# Patient Record
Sex: Male | Born: 1950 | Race: White | Hispanic: No | Marital: Married | Smoking: Current every day smoker
Health system: Southern US, Community
[De-identification: ages and names within clinical notes are randomized; demographics above are authoritative.]

## PROBLEM LIST (undated history)

## (undated) DIAGNOSIS — H9190 Unspecified hearing loss, unspecified ear: Secondary | ICD-10-CM

## (undated) DIAGNOSIS — T7840XA Allergy, unspecified, initial encounter: Secondary | ICD-10-CM

## (undated) DIAGNOSIS — I77811 Abdominal aortic ectasia: Secondary | ICD-10-CM

## (undated) DIAGNOSIS — I739 Peripheral vascular disease, unspecified: Secondary | ICD-10-CM

## (undated) DIAGNOSIS — IMO0001 Reserved for inherently not codable concepts without codable children: Secondary | ICD-10-CM

## (undated) DIAGNOSIS — E785 Hyperlipidemia, unspecified: Secondary | ICD-10-CM

## (undated) DIAGNOSIS — M25561 Pain in right knee: Secondary | ICD-10-CM

## (undated) DIAGNOSIS — M1990 Unspecified osteoarthritis, unspecified site: Secondary | ICD-10-CM

## (undated) DIAGNOSIS — I2510 Atherosclerotic heart disease of native coronary artery without angina pectoris: Secondary | ICD-10-CM

## (undated) DIAGNOSIS — F17200 Nicotine dependence, unspecified, uncomplicated: Secondary | ICD-10-CM

## (undated) DIAGNOSIS — R5383 Other fatigue: Secondary | ICD-10-CM

## (undated) DIAGNOSIS — E039 Hypothyroidism, unspecified: Secondary | ICD-10-CM

## (undated) DIAGNOSIS — N419 Inflammatory disease of prostate, unspecified: Secondary | ICD-10-CM

## (undated) DIAGNOSIS — I7789 Other specified disorders of arteries and arterioles: Secondary | ICD-10-CM

## (undated) DIAGNOSIS — R7303 Prediabetes: Secondary | ICD-10-CM

## (undated) DIAGNOSIS — Z86010 Personal history of colonic polyps: Secondary | ICD-10-CM

## (undated) DIAGNOSIS — N200 Calculus of kidney: Secondary | ICD-10-CM

## (undated) DIAGNOSIS — Z955 Presence of coronary angioplasty implant and graft: Secondary | ICD-10-CM

## (undated) DIAGNOSIS — M25562 Pain in left knee: Secondary | ICD-10-CM

## (undated) DIAGNOSIS — J329 Chronic sinusitis, unspecified: Secondary | ICD-10-CM

## (undated) HISTORY — DX: Allergy, unspecified, initial encounter: T78.40XA

## (undated) HISTORY — DX: Presence of coronary angioplasty implant and graft: Z95.5

## (undated) HISTORY — DX: Unspecified osteoarthritis, unspecified site: M19.90

## (undated) HISTORY — PX: APPENDECTOMY: SHX54

## (undated) HISTORY — DX: Reserved for inherently not codable concepts without codable children: IMO0001

## (undated) HISTORY — DX: Atherosclerotic heart disease of native coronary artery without angina pectoris: I25.10

## (undated) HISTORY — DX: Unspecified hearing loss, unspecified ear: H91.90

## (undated) HISTORY — PX: CARDIOVASCULAR STRESS TEST: SHX262

## (undated) HISTORY — DX: Peripheral vascular disease, unspecified: I73.9

## (undated) HISTORY — DX: Personal history of colonic polyps: Z86.010

## (undated) HISTORY — DX: Other fatigue: R53.83

## (undated) HISTORY — DX: Chronic sinusitis, unspecified: J32.9

## (undated) HISTORY — DX: Prediabetes: R73.03

## (undated) HISTORY — DX: Inflammatory disease of prostate, unspecified: N41.9

## (undated) HISTORY — DX: Calculus of kidney: N20.0

## (undated) HISTORY — DX: Pain in left knee: M25.562

## (undated) HISTORY — DX: Pain in right knee: M25.561

## (undated) HISTORY — DX: Hyperlipidemia, unspecified: E78.5

## (undated) HISTORY — DX: Hypothyroidism, unspecified: E03.9

## (undated) HISTORY — DX: Nicotine dependence, unspecified, uncomplicated: F17.200

## (undated) HISTORY — DX: Other specified disorders of arteries and arterioles: I77.89

---
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
## 2003-03-23 DIAGNOSIS — Z955 Presence of coronary angioplasty implant and graft: Secondary | ICD-10-CM

## 2003-03-23 HISTORY — DX: Presence of coronary angioplasty implant and graft: Z95.5

## 2003-04-16 ENCOUNTER — Ambulatory Visit (HOSPITAL_COMMUNITY): Admission: RE | Admit: 2003-04-16 | Discharge: 2003-04-16 | Payer: Self-pay | Admitting: Cardiology

## 2003-04-16 HISTORY — PX: CARDIAC CATHETERIZATION: SHX172

## 2003-04-19 ENCOUNTER — Ambulatory Visit (HOSPITAL_COMMUNITY): Admission: RE | Admit: 2003-04-19 | Discharge: 2003-04-20 | Payer: Self-pay | Admitting: Cardiology

## 2003-04-19 HISTORY — PX: CORONARY STENT PLACEMENT: SHX1402

## 2003-06-11 ENCOUNTER — Ambulatory Visit (HOSPITAL_COMMUNITY): Admission: RE | Admit: 2003-06-11 | Discharge: 2003-06-11 | Payer: Self-pay | Admitting: Cardiology

## 2003-08-30 ENCOUNTER — Ambulatory Visit (HOSPITAL_COMMUNITY): Admission: RE | Admit: 2003-08-30 | Discharge: 2003-08-30 | Payer: Self-pay | Admitting: Cardiology

## 2005-01-20 ENCOUNTER — Ambulatory Visit (HOSPITAL_COMMUNITY): Admission: RE | Admit: 2005-01-20 | Discharge: 2005-01-20 | Payer: Self-pay | Admitting: Cardiology

## 2009-10-29 ENCOUNTER — Ambulatory Visit: Payer: Self-pay | Admitting: Cardiology

## 2009-11-09 ENCOUNTER — Emergency Department (HOSPITAL_BASED_OUTPATIENT_CLINIC_OR_DEPARTMENT_OTHER): Admission: EM | Admit: 2009-11-09 | Discharge: 2009-11-10 | Payer: Self-pay | Admitting: Emergency Medicine

## 2009-11-09 ENCOUNTER — Ambulatory Visit: Payer: Self-pay | Admitting: Diagnostic Radiology

## 2010-01-02 ENCOUNTER — Ambulatory Visit: Payer: Self-pay | Admitting: Cardiology

## 2010-03-22 DIAGNOSIS — IMO0001 Reserved for inherently not codable concepts without codable children: Secondary | ICD-10-CM

## 2010-03-22 HISTORY — DX: Reserved for inherently not codable concepts without codable children: IMO0001

## 2010-06-05 LAB — BASIC METABOLIC PANEL
BUN: 18 mg/dL (ref 6–23)
Calcium: 9.6 mg/dL (ref 8.4–10.5)
Chloride: 108 mEq/L (ref 96–112)
GFR calc Af Amer: >60 mL/min (ref >60)
GFR calc non Af Amer: >60 mL/min (ref >60)

## 2010-06-05 LAB — URINE MICROSCOPIC-ADD ON

## 2010-06-05 LAB — URINALYSIS, ROUTINE W REFLEX MICROSCOPIC
Leukocytes, UA: NEGATIVE
Nitrite: NEGATIVE
Specific Gravity, Urine: 1.027 (ref 1.005–1.030)

## 2010-08-07 NOTE — Cardiovascular Report (Signed)
NAME:  Lawrence Hunter, Lawrence Hunter                       ACCOUNT NO.:  [id number]   MEDICAL RECORD NO.:  [id number]                   PATIENT TYPE:  OIB   LOCATION:  2866                                 FACILITY:  [facility]   PHYSICIAN:  Colleen Can. Deborah Chalk, M.D.            DATE OF BIRTH:  1950/05/31   DATE OF PROCEDURE:  04/16/2003  DATE OF DISCHARGE:  04/16/2003                              CARDIAC CATHETERIZATION   PROCEDURE:  Left heart catheterization with selective coronary angiography,  left ventricular angiography and AngioSeal closure.   CARDIOLOGIST:  Colleen Can. Deborah Chalk, M.D.   TYPE AND SITE OF ENTRY:  Percutaneous right femoral artery, percutaneous  right femoral vein.   CATHETERS:  A 6 French 4 curved Judkins right and left coronary catheters, 6  French pigtail ventriculographic catheter.   COMMENT:  The patient tolerated the procedure well.   CONTRAST MATERIAL:  Omnipaque   HEMODYNAMIC DATA:  1. The aortic pressure was 108/66.  2. Left ventricular pressure was 108/5-7.  3. There was no aortic valve gradient noted on pullback.   ANGIOGRAPHIC DATA:  1. The left main coronary artery was normal.  2. The left anterior descending has a 50% narrowing after the first diagonal     vessel.  There are irregularities otherwise.  3. The left circumflex is totally occluded in a posterior lateral branch.     There was retrograde fill into this branch almost back to the area of     occlusion.  It would appear that this occlusion could be suitable for     intervention.  4. The right coronary artery has an ulcerated plaque in the proximal segment     approximately 3 cm from the origin.  This is a 2.75 mm vessel at this     juncture.  There appears to be an ulceration at this area.  There are     irregularities and diffuse atherosclerosis in the remainder of the right     coronary artery, but then there are excellent collaterals to the distal     left circumflex by way of the continuation  branch of the right coronary     artery as well as the right atrial branch.   LEFT VENTRICULOGRAPHY:  Left ventricular angiogram was performed in the RAO  position.  The overall cardiac size and silhouette are normal.  The global  ejection fraction is 60%.  Regional wall motion appears to be normal.   OVERALL IMPRESSION:  1. Normal left ventricular function.  2. 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   PLAN:  Mr. Mantione will be brought back to the catheterization lab in three  days while on Plavix.  We will attempt to open the left circumflex.  If that  is successful, we will move on to angioplasty  and stent placement of the  proximal right coronary artery.                                               Colleen Can. Deborah Chalk, M.D.    SNT/MEDQ  D:  04/16/2003  T:  04/17/2003  Job:  [id number]

## 2010-08-07 NOTE — H&P (Signed)
NAME:  Lawrence Hunter, FANT NO.:  [id number]   MEDICAL RECORD NO.:  [id number]                   PATIENT TYPE:  OIB   LOCATION:                                       FACILITY:  [facility]   PHYSICIAN:  Colleen Can. Deborah Chalk, M.D.            DATE OF BIRTH:  1951-01-18   DATE OF ADMISSION:  04/16/2003  DATE OF DISCHARGE:                                HISTORY & PHYSICAL   CHIEF COMPLAINT:  Shortness of breath, previous episode of chest pain with  an abnormal stress Cardiolite study.   HISTORY OF PRESENT ILLNESS:  The patient is a 60 year old white male who has  multiple cardiovascular risk factors. He presented for a routine stress  Cardiolite study last week. Approximately 7-10 days ago he had an episode of  an awareness of a discomfort under the left breast. It was basically off and  on throughout the course of the day. It was not worse with exertion. It  subsequently subsided without intervention. He has had some ongoing  shortness of breath which has not been unusual. He was referred for a stress  Cardiolite study in which he demonstrated fair exercise tolerance. He had no  chest pain, but did have shortness of breath. His EKG showed poor R-wave  progression anteriorly. There were no ST changes to suggest ischemia. His  blood pressure response was adequate. There was a reversible anterolateral  defect most consistent with ischemia noted on the scintigraphic images. He  is now referred for elective cardiac catheterization. He has had no further  bouts of chest pain.   PAST MEDICAL HISTORY:  1. Status post appendectomy.  2. Hyperlipidemia. Recently initiated on statin therapy.  3. Questionable hypothyroidism.  4. Ongoing tobacco abuse.  5. Shortness of breath.   ALLERGIES:  No known drug allergies.   CURRENT MEDICATIONS:  1. Lipitor 20 mg.  2. Aspirin daily, just started last week.   FAMILY HISTORY:  Father died of a stroke at 1. Mother died at 87 with  a  stroke as well. His brother has had two heart attacks and is 62 years old.  He has no sisters.   SOCIAL HISTORY:  He is employed at __________ [employer]. He smokes one  and one-half pack of cigarettes a day and has no alcohol use. He lives at  home with his wife. He has two sons.   REVIEW OF SYSTEMS:  Basically as noted above. He has had no recent fever or  flu. He notes no changes in his voice. He does have some problems with  constipation and uses laxatives on a p.r.n. basis. Otherwise, he has had no  abdominal complaints. He has had no peripheral edema. No recent bout of  chest pain. He has had no history of syncope. Otherwise review of systems is  as noted above and is otherwise unremarkable.   PHYSICAL EXAMINATION:  GENERAL:  He is a pleasant white male  in no acute  distress.  VITAL SIGNS:  Blood pressure 120/80 sitting, 110/80 standing, heart rate 96,  respirations 18. He is afebrile.  SKIN:  Warm and dry. Color is unremarkable.  HEENT:  Basically unremarkable.  LUNGS:  Somewhat course.  CARDIAC:  Regular rhythm.  ABDOMEN:  Soft. Positive bowel sounds. Nontender.  EXTREMITIES:  Without edema.  NEUROLOGIC:  Intact. There are no gross focal deficits.   LABORATORY DATA:  Pertinent labs are pending.   IMPRESSION:  1. Abnormal stress Cardiolite study.  2. Previous episode of atypical chest pain.  3. Shortness of breath.  4. Ongoing tobacco abuse.  5. Hyperlipidemia with recent initiation of statin therapy.  6. Questionable hypothyroidism. TSH was 12 when checked on April 08, 2003.  7. Positive family history for cardiovascular disease.   PLAN:  We will proceed on with elective cardiac catheterization. The  procedure has been reviewed in full detail and he is willing to proceed on  Tuesday, April 16, 2003.      Juanell Fairly C. Earl Gala, N.P.                 Colleen Can. Deborah Chalk, M.D.    LCO/MEDQ  D:  04/15/2003  T:  04/15/2003  Job:  [id number]   cc:   Brooke Bonito,  M.D.  [address]  [place]  [place] [address]  Fax: [phone]

## 2010-08-07 NOTE — Cardiovascular Report (Signed)
NAME:  Lawrence Hunter, Lawrence Hunter                       ACCOUNT NO.:  [id number]   MEDICAL RECORD NO.:  [id number]                   PATIENT TYPE:  OIB   LOCATION:  6524                                 FACILITY:  [facility]   PHYSICIAN:  Colleen Can. Deborah Chalk, M.D.            DATE OF BIRTH:  04/09/1950   DATE OF PROCEDURE:  04/19/2003  DATE OF DISCHARGE:  04/20/2003                              CARDIAC CATHETERIZATION   PROCEDURE:  Angioplasty and stent placement in the right coronary artery  with angioplasty and stent placement in the left circumflex coronary.   CARDIOLOGIST:  Colleen Can. Deborah Chalk, M.D.   DESCRIPTION OF PROCEDURE:  We initially turned out attention to the occluded  left circumflex coronary.  The patient received Integrilin and heparin.  We  used a Voda 3.5 7 [place] guide and a high torque floppy straight wire.  Subsequently, we moved on to a ________ medium and a _______.  We were  really unable to satisfactorily cross the stenosis with this.  We then  returned out attention to the right coronary artery and used the 7 [place] FR-  4 guide with side holes.  The high torque floppy guidewire was deployed  across the lesion, and we used a Cypher 3.0 x 18 mm Cortis J&J stent applied  at 15 atmospheres for 15 seconds proximally and then an reinflation of 14  atmospheres for 12 seconds. The final angiographic result in the right  coronary artery was felt to be excellent with no residual stenosis.   We then turned our attention back to the left coronary system.  We again  used the Voda guide.  We tried the Cross-It 100.  We then used the Cross-It  200 and were able to cross the totally occluded vessel.  We initially  dilated with a 1.5 x 20 mm Maverick balloon into the left circumflex  proximally.  We then inflated that progressively and were able to have  satisfactory antegrade flow in what was the previously occluded left  circumflex.  We then used a Maverick 2.5 x 20 mm balloon and inflated  that a  maximum of 11 atmospheres.  We then proceeded along with a Cypher 3.0 x 33  mm stent.  This was positioned in appropriate position and deployed at 14  atmospheres for 14 seconds.  The final angiographic result was felt to be  excellent with no residual stenosis.  Overall, the patient tolerated the  procedure well.   OVERALL IMPRESSION:  1. Successful angioplasty and stent placement in the left circumflex     coronary and right coronary artery.  2. Mild residual stenosis in the mid portion of the left anterior     descending.  Colleen Can. Deborah Chalk, M.D.    SNT/MEDQ  D:  05/01/2003  T:  05/02/2003  Job:  [id number]

## 2010-09-18 ENCOUNTER — Encounter: Payer: Self-pay | Admitting: Nurse Practitioner

## 2010-09-28 ENCOUNTER — Other Ambulatory Visit: Payer: Self-pay | Admitting: *Deleted

## 2010-09-28 ENCOUNTER — Ambulatory Visit: Payer: Self-pay | Admitting: Nurse Practitioner

## 2010-09-29 ENCOUNTER — Other Ambulatory Visit: Payer: Self-pay | Admitting: *Deleted

## 2010-09-29 DIAGNOSIS — E785 Hyperlipidemia, unspecified: Secondary | ICD-10-CM

## 2010-10-08 ENCOUNTER — Encounter: Payer: Self-pay | Admitting: Nurse Practitioner

## 2010-10-09 ENCOUNTER — Ambulatory Visit (INDEPENDENT_AMBULATORY_CARE_PROVIDER_SITE_OTHER): Payer: BC Managed Care – PPO | Admitting: Nurse Practitioner

## 2010-10-09 ENCOUNTER — Other Ambulatory Visit (INDEPENDENT_AMBULATORY_CARE_PROVIDER_SITE_OTHER): Payer: BC Managed Care – PPO | Admitting: *Deleted

## 2010-10-09 ENCOUNTER — Encounter: Payer: Self-pay | Admitting: Nurse Practitioner

## 2010-10-09 VITALS — BP 126/80 | HR 88 | Wt 204.0 lb

## 2010-10-09 DIAGNOSIS — E785 Hyperlipidemia, unspecified: Secondary | ICD-10-CM

## 2010-10-09 DIAGNOSIS — I2510 Atherosclerotic heart disease of native coronary artery without angina pectoris: Secondary | ICD-10-CM | POA: Insufficient documentation

## 2010-10-09 DIAGNOSIS — F17200 Nicotine dependence, unspecified, uncomplicated: Secondary | ICD-10-CM

## 2010-10-09 DIAGNOSIS — Z720 Tobacco use: Secondary | ICD-10-CM

## 2010-10-09 DIAGNOSIS — E7800 Pure hypercholesterolemia, unspecified: Secondary | ICD-10-CM | POA: Insufficient documentation

## 2010-10-09 LAB — HEPATIC FUNCTION PANEL
ALT: 24 U/L (ref 0–53)
AST: 17 U/L (ref 0–37)
Albumin: 5 g/dL (ref 3.5–5.2)
Alkaline Phosphatase: 95 U/L (ref 39–117)
Bilirubin, Direct: 0.1 mg/dL (ref 0.0–0.3)
Total Bilirubin: 1.1 mg/dL (ref 0.3–1.2)
Total Protein: 7.9 g/dL (ref 6.0–8.3)

## 2010-10-09 LAB — BASIC METABOLIC PANEL
BUN: 20 mg/dL (ref 6–23)
CO2: 27 mEq/L (ref 19–32)
Calcium: 9.2 mg/dL (ref 8.4–10.5)
Chloride: 106 mEq/L (ref 96–112)
Creatinine, Ser: 1 mg/dL (ref 0.4–1.5)
GFR: 83.46 mL/min (ref >60.00)
Glucose, Bld: 96 mg/dL (ref 70–99)
Potassium: 4.1 mEq/L (ref 3.5–5.1)
Sodium: 141 mEq/L (ref 135–145)

## 2010-10-09 LAB — LIPID PANEL
Cholesterol: 127 mg/dL (ref 0–200)
HDL: 43.8 mg/dL (ref >39.00)
LDL Cholesterol: 60 mg/dL (ref 0–99)
Total CHOL/HDL Ratio: 3
Triglycerides: 117 mg/dL (ref 0.0–149.0)
VLDL: 23.4 mg/dL (ref 0.0–40.0)

## 2010-10-09 NOTE — Assessment & Plan Note (Signed)
Smoking cessation is once again encouraged.  

## 2010-10-09 NOTE — Progress Notes (Signed)
    [name] Date of Birth: 05/29/1950   History of Present Illness: Rocky Link is seen today for his 9 month check. He is seen for Dr. Sanjuana Kava. He is a former patient of Dr. Ronnald Nian. He is doing well. No chest pain. He continues to smoke but continues to contemplate quitting. He is staying active. He is tolerating his medicines. Labs are checked today.   Current Outpatient Prescriptions on File Prior to Visit  Medication Sig Dispense Refill  . aspirin 81 MG tablet Take 81 mg by mouth daily.        Marland Kitchen ezetimibe (ZETIA) 10 MG tablet Take 10 mg by mouth daily.        Marland Kitchen levothyroxine (SYNTHROID, LEVOTHROID) 88 MCG tablet Take 88 mcg by mouth daily.        . simvastatin (ZOCOR) 40 MG tablet Take 40 mg by mouth at bedtime.          Allergies  Allergen Reactions  . Plavix (Clopidogrel Bisulfate) Rash    Past Medical History  Diagnosis Date  . Coronary artery disease   . Hyperlipidemia   . Hypothyroidism   . Tobacco abuse   . S/P coronary artery stent placement 2005    RCA and LCX  . Normal nuclear stress test 2009  . Claudication     Normal ABI's in 2006    Past Surgical History  Procedure Date  . Coronary stent placement 04/19/2003    STENT PLACEMENT IN THE LEFT CIRCUMFLEX CORONARY AND RIGHT CORONARY ARTERY. MILD RESIDUAL STENOSIS IN THE MID PORTION OF THE LEFT ANTERIOR DESCENDING  . Cardiac catheterization 04/16/2003    NORMAL. EF 60%. TOTALLY OCCLUDED LEFT CIRCUMFLEX WITH SEVERE DISEASE, AND ULCERATED PLAQUE IN THE PROXIMAL RIGHT CORONARY ARTERY AND MILD ATHERSCLEROSIS IN THE LEFT ANTERIOR DESCENDING  . Cardiovascular stress test 2009  . Appendectomy     History  Smoking status  . Current Everyday Smoker -- 1.0 packs/day  Smokeless tobacco  . Not on file    History  Alcohol Use No    Family History  Problem Relation Age of Onset  . Hypertension Mother   . Stroke Mother   . Hypertension Father   . Stroke Father   . Heart attack Brother     STENT     Review of Systems: The review of systems is positive for some recent sinus issues. No chest pain. He says he is not short of breath.  All other systems were reviewed and are negative.  Physical Exam: BP 126/80  Pulse 88  Wt 204 lb (92.534 kg) Patient is pleasant and in no acute distress. Skin is warm and dry. Color is normal.  HEENT is unremarkable. Normocephalic/atraumatic. PERRL. Sclera are nonicteric. Neck is supple. No masses. No JVD. Lungs are coarse. Cardiac exam shows a regular rate and rhythm. Abdomen is soft. Extremities are without edema. Gait and ROM are intact. No gross neurologic deficits noted.  LABORATORY DATA:   Assessment / Plan:

## 2010-10-09 NOTE — Patient Instructions (Signed)
Stay on your current medicines We need to update your stress test I will have you see Dr. Doylene Bode iin 6 months with fasting labs Call me for any problems.

## 2010-10-09 NOTE — Assessment & Plan Note (Addendum)
Labs are checked today. Coupon card for zetia given today.    Labs are reviewed and are satisfactory. Stay on same medicines. Recheck in 6 months.

## 2010-10-09 NOTE — Assessment & Plan Note (Signed)
He had remote stenting in 2005. He is allergic to Plavix and used Ticlid at that time. Currently doing well. Last stress test was in 2009. Will go ahead and update. We will see him back in 6 months. He will see Dr. Sanjuana Kava at that time. Patient is agreeable to this plan and will call if any problems develop in the interim.

## 2010-10-14 ENCOUNTER — Telehealth: Payer: Self-pay | Admitting: *Deleted

## 2010-10-14 NOTE — Telephone Encounter (Signed)
Notified of lab results. Will get fasting labs in Jan when sees Dr. Sanjuana Kava. Scheduled for nuclear study 9/5. Dr. Sanjuana Kava will call him results.

## 2010-11-06 ENCOUNTER — Encounter: Payer: Self-pay | Admitting: *Deleted

## 2010-11-25 ENCOUNTER — Ambulatory Visit (HOSPITAL_COMMUNITY): Payer: BC Managed Care – PPO | Attending: Cardiovascular Disease | Admitting: Radiology

## 2010-11-25 DIAGNOSIS — I2510 Atherosclerotic heart disease of native coronary artery without angina pectoris: Secondary | ICD-10-CM | POA: Insufficient documentation

## 2010-11-25 DIAGNOSIS — E785 Hyperlipidemia, unspecified: Secondary | ICD-10-CM

## 2010-11-25 MED ORDER — TECHNETIUM TC 99M TETROFOSMIN IV KIT
33.0000 | PACK | Freq: Once | INTRAVENOUS | Status: AC | PRN
  Administered 2010-11-25: 33 via INTRAVENOUS

## 2010-11-25 MED ORDER — TECHNETIUM TC 99M TETROFOSMIN IV KIT
11.0000 | PACK | Freq: Once | INTRAVENOUS | Status: AC | PRN
  Administered 2010-11-25: 11 via INTRAVENOUS

## 2010-11-25 NOTE — Progress Notes (Signed)
[facility] 3 NUCLEAR MED [address] [place] [address] [phone]  Cardiology Nuclear Med Study  Lawrence Hunter is a 60 y.o. male [id number] 07/14/1950   Nuclear Med Background Indication for Stress Test:  Evaluation for Ischemia and PTCA/Stent Patency  History:  '05 PTCA/Stent-RCA/LCX; '09 UVO:[id number], EF=69% Cardiac Risk Factors: Claudication, Family History - CAD, Lipids and Smoker  Symptoms:  No cardiac complaints.   Nuclear Pre-Procedure Caffeine/Decaff Intake:  None NPO After: 7:00pm   Lungs:  Clear. IV 0.9% NS with Angio Cath:  20g  IV Site: R Antecubital  IV Started by:  Lawrence Hunter, EMT-P  Chest Size (in):  42 Cup Size: n/a  Height: 6' (1.829 m)  Weight:  201 lb (91.173 kg)  BMI:  Body mass index is 27.26 kg/(m^2). Tech Comments:  NA    Nuclear Med Study 1 or 2 day study: 1 day  Stress Test Type:  Stress  Reading MD: Marca Ancona, MD  Order Authorizing Provider:  Verne Carrow, MD; Norma Fredrickson, NP  Resting Radionuclide: Technetium 89m Tetrofosmin  Resting Radionuclide Dose: 10.8 mCi   Stress Radionuclide:  Technetium 30m Tetrofosmin  Stress Radionuclide Dose: 33.0 mCi           Stress Protocol Rest HR: 82 Stress HR: 144  Rest BP: 107/76 Stress BP: 177/84  Exercise Time (min): 6:45 METS: 7.0   Predicted Max HR: 159 bpm % Max HR: 90.57 bpm Rate Pressure Product: 03474   Dose of Adenosine (mg):  n/a Dose of Lexiscan: n/a mg  Dose of Atropine (mg): n/a Dose of Dobutamine: n/a mcg/kg/min (at max HR)  Stress Test Technologist: Lawrence Hunter, CMA-N  Nuclear Technologist:  Lawrence Hunter, CNMT     Rest Procedure:  Myocardial perfusion imaging was performed at rest 45 minutes following the intravenous administration of Technetium 83m Tetrofosmin.  Rest ECG: No acute changes, poor R-wave progression.  Stress Procedure:  The patient exercised for 6:45 on the treadmill utilizing the Bruce protocol.  The patient  stopped due to fatigue and denied any chest pain.  There were no diagnostic ST-T wave changes.  Technetium 31m Tetrofosmin was injected at peak exercise and myocardial perfusion imaging was performed after a brief delay.  Stress ECG: No significant change from baseline ECG  QPS Raw Data Images:  Normal; no motion artifact; normal heart/lung ratio. Stress Images:  Normal homogeneous uptake in all areas of the myocardium. Rest Images:  Normal homogeneous uptake in all areas of the myocardium. Subtraction (SDS):  There is no evidence of scar or ischemia. Transient Ischemic Dilatation (Normal <1.22):  0.96 Lung/Heart Ratio (Normal <0.45):  0.33  Quantitative Gated Spect Images QGS EDV:  89 ml QGS ESV:  31 ml QGS cine images:  NL LV Function; NL Wall Motion QGS EF: 65%  Impression Exercise Capacity:  Fair exercise capacity. BP Response:  No chest pain, stopped due to fatigue.  Clinical Symptoms:  No chest pain. ECG Impression:  No significant ST segment change suggestive of ischemia. Comparison with Prior Nuclear Study: No significant change from previous study  Overall Impression:  Normal stress nuclear study.  Lawrence Hunter [employer]

## 2010-12-30 ENCOUNTER — Other Ambulatory Visit: Payer: Self-pay | Admitting: Cardiology

## 2011-01-08 ENCOUNTER — Other Ambulatory Visit: Payer: Self-pay | Admitting: *Deleted

## 2011-01-08 MED ORDER — EZETIMIBE 10 MG PO TABS: 10.0000 mg | ORAL_TABLET | Freq: Every day | ORAL | Status: DC

## 2011-01-20 ENCOUNTER — Other Ambulatory Visit: Payer: Self-pay | Admitting: Cardiovascular Disease

## 2011-01-21 ENCOUNTER — Telehealth: Payer: Self-pay | Admitting: *Deleted

## 2011-01-21 NOTE — Telephone Encounter (Signed)
Received refill request for levothyroxine. I called and spoke with wife and she states Dr. Deborah Chalk refilled this medication for pt when needed.  Pt no longer sees Dr. Juleen China and does not have primary care MD at this time.  I [facility] pt [facility]. Wife also given results of stress test.

## 2011-01-21 NOTE — Progress Notes (Signed)
Pt informed of results. Charlotte Crumb and I saw this study today. cdm

## 2011-02-05 NOTE — Telephone Encounter (Signed)
New problem:  Patient would like to medication zetia changed to something else.

## 2011-02-05 NOTE — Telephone Encounter (Signed)
Spoke with pt. He reports cost of Zetia has gone up recently and he is wondering if he could be switched to another medication. I told pt I would send to Dr. Clifton James for review and call him back with his recommendations.  Pt is due for lipid and liver profile in January.

## 2011-02-08 NOTE — Telephone Encounter (Signed)
We can stop his Zetia for now and continue Zocor. Based on repeat lipids/LFTS in January, we may have to increase dose of statin or change to a different statin. Can we let him know? Thanks, chris

## 2011-02-08 NOTE — Telephone Encounter (Signed)
FU Call: Pt returning call to [facility]. Please call back.

## 2011-02-08 NOTE — Telephone Encounter (Signed)
Spoke with pt and gave him recommendations from Dr. Clifton James. He will stop Zetia and continue zocor and is aware to come in for fasting labs on day of appt in January. He will call to schedule appt.

## 2011-02-08 NOTE — Telephone Encounter (Signed)
Left message to call back  

## 2011-03-17 ENCOUNTER — Other Ambulatory Visit: Payer: Self-pay | Admitting: Cardiovascular Disease

## 2011-05-24 ENCOUNTER — Other Ambulatory Visit: Payer: Self-pay | Admitting: Cardiovascular Disease

## 2011-05-25 ENCOUNTER — Telehealth: Payer: Self-pay | Admitting: *Deleted

## 2011-05-25 NOTE — Telephone Encounter (Signed)
Received refill request in office for levothyroxine.  Previous phone note indicates this was previously filled by Dr. Deborah Chalk. Pt is due for follow up with Dr. Clifton James.  Will refill levothyroxine for 2 months but pt will need to schedule appt with Dr. Clifton James for follow up.  I called to give pt this information and left message to call back.

## 2011-05-28 NOTE — Telephone Encounter (Addendum)
Patient returned call, I informed him that he needs to make f/u with [facility], will schedule next avail.    Patient scheduled for 06/30/11 @ 11AM w/[place]

## 2011-06-02 ENCOUNTER — Other Ambulatory Visit: Payer: Self-pay

## 2011-06-02 MED ORDER — SIMVASTATIN 40 MG PO TABS: 40.0000 mg | ORAL_TABLET | Freq: Every day | ORAL | Status: DC

## 2011-06-30 ENCOUNTER — Ambulatory Visit (INDEPENDENT_AMBULATORY_CARE_PROVIDER_SITE_OTHER): Payer: BC Managed Care – PPO | Admitting: Cardiovascular Disease

## 2011-06-30 ENCOUNTER — Encounter: Payer: Self-pay | Admitting: Cardiovascular Disease

## 2011-06-30 VITALS — BP 110/61 | HR 91 | Ht 72.0 in | Wt 203.0 lb

## 2011-06-30 DIAGNOSIS — I2510 Atherosclerotic heart disease of native coronary artery without angina pectoris: Secondary | ICD-10-CM

## 2011-06-30 DIAGNOSIS — F17200 Nicotine dependence, unspecified, uncomplicated: Secondary | ICD-10-CM

## 2011-06-30 DIAGNOSIS — Z720 Tobacco use: Secondary | ICD-10-CM

## 2011-06-30 DIAGNOSIS — E785 Hyperlipidemia, unspecified: Secondary | ICD-10-CM

## 2011-06-30 DIAGNOSIS — E039 Hypothyroidism, unspecified: Secondary | ICD-10-CM

## 2011-06-30 MED ORDER — SIMVASTATIN 40 MG PO TABS: 40.0000 mg | ORAL_TABLET | Freq: Every day | ORAL | Status: DC

## 2011-06-30 MED ORDER — LEVOTHYROXINE SODIUM 88 MCG PO TABS: 88.0000 ug | ORAL_TABLET | Freq: Every day | ORAL | Status: DC

## 2011-06-30 NOTE — Assessment & Plan Note (Signed)
Smoking cessation encouraged!

## 2011-06-30 NOTE — Progress Notes (Signed)
History of Present Illness:61 yo WM with history of CAD, HLD, tobacco abuse, hypothyroidism here today for cardiac follow up. Lawrence Hunter has been followed in the past by Dr. Deborah Chalk. Lawrence Hunter had a NSTEMI in January 2005 with a Cypher DES (3.0 x 33mm)  placed in the occluded Circumflex and a Cypher DES (3.0 x 18 mm) placed in the RCA. Lawrence Hunter has had no further caths since then. Stress myoview in July 2012 showed no ischemia.   Lawrence Hunter is here today for follow up. Lawrence Hunter is doing well. No chest pain. Lawrence Hunter continues to smoke 1.5 ppd. Lawrence Hunter is staying active. Lawrence Hunter is tolerating his medicines.   Primary Care Physician: Dr. Juleen China  Last Lipid Profile: July 2012: Total chol:   127 HDL: 44   LDL:  60  Past Medical History  Diagnosis Date  . Coronary artery disease   . Hyperlipidemia   . Hypothyroidism   . Tobacco abuse   . S/P coronary artery stent placement 2005    RCA and LCX  . Normal nuclear stress test 2012  . Claudication     Normal ABI's in 2006    Past Surgical History  Procedure Date  . Coronary stent placement 04/19/2003    STENT PLACEMENT IN THE LEFT CIRCUMFLEX CORONARY AND RIGHT CORONARY ARTERY. MILD RESIDUAL STENOSIS IN THE MID PORTION OF THE LEFT ANTERIOR DESCENDING  . Cardiac catheterization 04/16/2003    NORMAL. EF 60%. TOTALLY OCCLUDED LEFT CIRCUMFLEX WITH SEVERE DISEASE, AND ULCERATED PLAQUE IN THE PROXIMAL RIGHT CORONARY ARTERY AND MILD ATHERSCLEROSIS IN THE LEFT ANTERIOR DESCENDING  . Cardiovascular stress test 2009  . Appendectomy     Current Outpatient Prescriptions  Medication Sig Dispense Refill  . aspirin 81 MG tablet Take 81 mg by mouth daily.        Marland Kitchen levothyroxine (SYNTHROID, LEVOTHROID) 88 MCG tablet TAKE 1 TABLET BY MOUTH EVERY DAY  30 tablet  2  . simvastatin (ZOCOR) 40 MG tablet Take 1 tablet (40 mg total) by mouth at bedtime.  30 tablet  2    Allergies  Allergen Reactions  . Plavix (Clopidogrel Bisulfate) Rash    History   Social History  . Marital Status: Married    Spouse  Name: N/A    Number of Children: 2  . Years of Education: N/A   Occupational History  . Sales Rep    Social History Main Topics  . Smoking status: Current Everyday Smoker -- 1.0 packs/day  . Smokeless tobacco: Not on file  . Alcohol Use: No  . Drug Use: No  . Sexually Active: Yes   Other Topics Concern  . Not on file   Social History Narrative  . No narrative on file    Family History  Problem Relation Age of Onset  . Hypertension Mother   . Stroke Mother   . Hypertension Father   . Stroke Father   . Heart attack Brother     STENT    Review of Systems:  As stated in the HPI and otherwise negative.   BP 110/61  Pulse 91  Ht 6' (1.829 m)  Wt 203 lb (92.08 kg)  BMI 27.53 kg/m2  Physical Examination: General: Well developed, well nourished, NAD HEENT: OP clear, mucus membranes moist SKIN: warm, dry. No rashes. Neuro: No focal deficits Musculoskeletal: Muscle strength 5/5 all ext Psychiatric: Mood and affect normal Neck: No JVD, no carotid bruits, no thyromegaly, no lymphadenopathy. Lungs:Clear bilaterally, no wheezes, rhonci, crackles Cardiovascular: Regular rate and rhythm. No murmurs,  gallops or rubs. Abdomen:Soft. Bowel sounds present. Non-tender.  Extremities: No lower extremity edema. Pulses are 2 + in the bilateral DP/PT.  EKG: NSR, rate 89 bpm. Normal EKG

## 2011-06-30 NOTE — Patient Instructions (Signed)
Your physician wants you to follow-up in:  6 months. You will receive a reminder letter in the mail two months in advance. If you don't receive a letter, please call our office to schedule the follow-up appointment.   

## 2011-06-30 NOTE — Assessment & Plan Note (Signed)
Stable. Will continue ASA and statin.

## 2011-06-30 NOTE — Assessment & Plan Note (Signed)
Last LDL July 2012 at goal. Continue statin.

## 2011-08-23 ENCOUNTER — Other Ambulatory Visit: Payer: Self-pay | Admitting: Cardiovascular Disease

## 2011-10-27 ENCOUNTER — Other Ambulatory Visit: Payer: Self-pay | Admitting: Cardiovascular Disease

## 2011-10-28 NOTE — Telephone Encounter (Signed)
Fax Received. Refill Completed. Lawrence Hunter (R.M.A)   

## 2012-03-13 ENCOUNTER — Other Ambulatory Visit: Payer: Self-pay | Admitting: Cardiovascular Disease

## 2012-03-20 ENCOUNTER — Other Ambulatory Visit: Payer: Self-pay | Admitting: Cardiovascular Disease

## 2012-03-27 ENCOUNTER — Telehealth: Payer: Self-pay | Admitting: Cardiovascular Disease

## 2012-03-27 NOTE — Telephone Encounter (Signed)
Called pharmacy and they stated patient has refills left. [facility]. Called patient home number and lm stating that pharmacy said they will fill his medications he could pick them up at his convenience. Number provided if he had any further questions.

## 2012-03-27 NOTE — Telephone Encounter (Signed)
Pt needs refill of simvastatin and synthroid , [facility]

## 2012-05-10 ENCOUNTER — Encounter: Payer: Self-pay | Admitting: Cardiovascular Disease

## 2012-05-10 ENCOUNTER — Ambulatory Visit (INDEPENDENT_AMBULATORY_CARE_PROVIDER_SITE_OTHER): Payer: BC Managed Care – PPO | Admitting: Cardiovascular Disease

## 2012-05-10 VITALS — BP 114/84 | HR 93 | Ht 72.0 in | Wt 209.0 lb

## 2012-05-10 DIAGNOSIS — I2510 Atherosclerotic heart disease of native coronary artery without angina pectoris: Secondary | ICD-10-CM

## 2012-05-10 DIAGNOSIS — E785 Hyperlipidemia, unspecified: Secondary | ICD-10-CM

## 2012-05-10 NOTE — Progress Notes (Signed)
History of Present Illness: [name] with history of CAD, HLD, tobacco abuse, hypothyroidism here today for cardiac follow up. He has been followed in the past by Dr. Deborah Chalk. He had a NSTEMI in January 2005 with a Cypher DES (3.0 x 33mm) placed in the occluded Circumflex and a Cypher DES (3.0 x 18 mm) placed in the RCA. He has had no further caths since then. Stress myoview in July 2012 showed no ischemia.   He is here today for follow up. He is doing well. No chest pain. He continues to smoke 1.5 ppd. He is staying active. He does yard work.  He is tolerating his medicines. He has been battling a sinus issue.   Primary Care Physician: Dr. Juleen China  Last Lipid Profile: Needs updating.   Past Medical History  Diagnosis Date  . Coronary artery disease   . Hyperlipidemia   . Hypothyroidism   . Tobacco abuse   . S/P coronary artery stent placement 2005    RCA and LCX  . Normal nuclear stress test 2012  . Claudication     Normal ABI's in 2006    Past Surgical History  Procedure Laterality Date  . Coronary stent placement  04/19/2003    STENT PLACEMENT IN THE LEFT CIRCUMFLEX CORONARY AND RIGHT CORONARY ARTERY. MILD RESIDUAL STENOSIS IN THE MID PORTION OF THE LEFT ANTERIOR DESCENDING  . Cardiac catheterization  04/16/2003    NORMAL. EF 60%. TOTALLY OCCLUDED LEFT CIRCUMFLEX WITH SEVERE DISEASE, AND ULCERATED PLAQUE IN THE PROXIMAL RIGHT CORONARY ARTERY AND MILD ATHERSCLEROSIS IN THE LEFT ANTERIOR DESCENDING  . Cardiovascular stress test  2009  . Appendectomy      Current Outpatient Prescriptions  Medication Sig Dispense Refill  . aspirin 81 MG tablet Take 81 mg by mouth daily.        Marland Kitchen levothyroxine (SYNTHROID, LEVOTHROID) 88 MCG tablet TAKE 1 TABLET BY MOUTH EVERY DAY  30 tablet  5  . simvastatin (ZOCOR) 40 MG tablet TAKE 1 TABLET BY MOUTH AT BEDTIME  30 tablet  6   No current facility-administered medications for this visit.    Allergies  Allergen Reactions  . Plavix (Clopidogrel  Bisulfate) Rash    History   Social History  . Marital Status: Married    Spouse Name: N/A    Number of Children: 2  . Years of Education: N/A   Occupational History  . Sales Rep    Social History Main Topics  . Smoking status: Current Every Day Smoker -- 1.00 packs/day  . Smokeless tobacco: Not on file  . Alcohol Use: No  . Drug Use: No  . Sexually Active: Yes   Other Topics Concern  . Not on file   Social History Narrative  . No narrative on file    Family History  Problem Relation Age of Onset  . Hypertension Mother   . Stroke Mother   . Hypertension Father   . Stroke Father   . Heart attack Brother     STENT    Review of Systems:  As stated in the HPI and otherwise negative.   BP 114/84  Pulse 93  Ht 6' (1.829 m)  Wt 209 lb (94.802 kg)  BMI 28.34 kg/m2  SpO2 94%  Physical Examination: General: Well developed, well nourished, NAD HEENT: OP clear, mucus membranes moist SKIN: warm, dry. No rashes. Neuro: No focal deficits Musculoskeletal: Muscle strength 5/5 all ext Psychiatric: Mood and affect normal Neck: No JVD, no carotid bruits, no  thyromegaly, no lymphadenopathy. Lungs:Clear bilaterally, no wheezes, rhonci, crackles Cardiovascular: Regular rate and rhythm. No murmurs, gallops or rubs. Abdomen:Soft. Bowel sounds present. Non-tender.  Extremities: No lower extremity edema. Pulses are 2 + in the bilateral DP/PT.  EKG: NSR, rate 93 bpm.   Assessment and Plan:   1. CAD: Stable. Will continue ASA and statin.   2. Tobacco abuse: Smoking cessation encouraged.   3. Hyperlipidemia: Continue statin. Will arrange lipids and LFTs.

## 2012-05-10 NOTE — Patient Instructions (Addendum)
Your physician wants you to follow-up in:  6 months.  You will receive a reminder letter in the mail two months in advance. If you don't receive a letter, please call our office to schedule the follow-up appointment.  Your physician recommends that you return for fasting lab work later this week or next week--Lipid and Liver profile

## 2012-05-12 ENCOUNTER — Other Ambulatory Visit: Payer: BC Managed Care – PPO

## 2012-06-22 ENCOUNTER — Other Ambulatory Visit (INDEPENDENT_AMBULATORY_CARE_PROVIDER_SITE_OTHER): Payer: BC Managed Care – PPO

## 2012-06-22 DIAGNOSIS — E785 Hyperlipidemia, unspecified: Secondary | ICD-10-CM

## 2012-06-22 LAB — HEPATIC FUNCTION PANEL
ALT: 25 U/L (ref 0–53)
AST: 20 U/L (ref 0–37)
Alkaline Phosphatase: 86 U/L (ref 39–117)
Bilirubin, Direct: 0.1 mg/dL (ref 0.0–0.3)
Total Bilirubin: 1 mg/dL (ref 0.3–1.2)
Total Protein: 7.3 g/dL (ref 6.0–8.3)

## 2012-06-22 LAB — LIPID PANEL
Cholesterol: 144 mg/dL (ref 0–200)
Triglycerides: 142 mg/dL (ref 0.0–149.0)

## 2012-06-23 ENCOUNTER — Telehealth: Payer: Self-pay | Admitting: Cardiovascular Disease

## 2012-06-23 NOTE — Telephone Encounter (Signed)
New Problem: ° ° ° °Patient called in returning your call regarding his recent lab results.  Please call back. °

## 2012-06-23 NOTE — Telephone Encounter (Signed)
Spoke with pt and reviewed lipid and liver results with him.  

## 2012-09-19 ENCOUNTER — Other Ambulatory Visit: Payer: Self-pay | Admitting: Cardiovascular Disease

## 2012-10-20 ENCOUNTER — Other Ambulatory Visit: Payer: Self-pay | Admitting: Cardiovascular Disease

## 2012-11-22 ENCOUNTER — Other Ambulatory Visit: Payer: Self-pay | Admitting: Cardiovascular Disease

## 2013-01-19 ENCOUNTER — Other Ambulatory Visit: Payer: Self-pay | Admitting: Cardiovascular Disease

## 2013-01-19 ENCOUNTER — Other Ambulatory Visit: Payer: Self-pay

## 2013-01-19 MED ORDER — LEVOTHYROXINE SODIUM 88 MCG PO TABS: ORAL_TABLET | ORAL | Status: DC

## 2013-01-19 NOTE — Telephone Encounter (Signed)
talked to patient's wife about refilling his levothyroxine 88 mcg, I told her that the RN Dennie Bible) said tht we could refill this med just one time and that future refills needs to come from PCP, and I also made him an appointment

## 2013-02-02 ENCOUNTER — Ambulatory Visit (INDEPENDENT_AMBULATORY_CARE_PROVIDER_SITE_OTHER): Payer: BC Managed Care – PPO | Admitting: Physician Assistant

## 2013-02-02 ENCOUNTER — Encounter: Payer: Self-pay | Admitting: Physician Assistant

## 2013-02-02 VITALS — BP 120/82 | HR 82 | Ht 72.0 in | Wt 206.0 lb

## 2013-02-02 DIAGNOSIS — F17200 Nicotine dependence, unspecified, uncomplicated: Secondary | ICD-10-CM

## 2013-02-02 DIAGNOSIS — E039 Hypothyroidism, unspecified: Secondary | ICD-10-CM

## 2013-02-02 DIAGNOSIS — I2510 Atherosclerotic heart disease of native coronary artery without angina pectoris: Secondary | ICD-10-CM

## 2013-02-02 DIAGNOSIS — E785 Hyperlipidemia, unspecified: Secondary | ICD-10-CM

## 2013-02-02 DIAGNOSIS — Z720 Tobacco use: Secondary | ICD-10-CM

## 2013-02-02 LAB — TSH: TSH: 3.68 u[IU]/mL (ref 0.35–5.50)

## 2013-02-02 NOTE — Patient Instructions (Addendum)
Labs today:  TSH (Dx 244.9).   Please arrange follow up with Michiel Sites, MD for your thyroid.  Let him know we drew labs today on your thyroid. Your physician assistant recommends that you schedule a follow-up appointment in: 6 months with Dr. Verne Carrow. Call 1-800-QUIT-NOW [phone]) for help with quitting smoking.    Your physician wants you to follow-up in: 6 MONTHS WITH DR. Clifton James. You will receive a reminder letter in the mail two months in advance. If you don't receive a letter, please call our office to schedule the follow-up appointment.

## 2013-02-02 NOTE — Progress Notes (Signed)
[address] [place], [place]  [address] Phone: [phone] Fax:  [phone]  Date:  02/02/2013   ID:  Lawrence Hunter, DOB 28-Sep-1950, MRN [id number]  PCP:  Michiel Sites, MD  Cardiologist:  Dr. Verne Carrow     History of Present Illness: Lawrence Hunter is a 62 y.o. male with a history of CAD, HL, hypothyroidism, tobacco abuse. He suffered a non-STEMI in 2005.  LHC (03/2003): LAD 50%, PL branch occluded, proximal RCA with severe disease and ulcerated plaque, EF 60%.  PCI: Cypher DES (3 x 33 mm) to the circumflex and Cypher DES (3 x 18 mm) to the RCA.  ETT-Myoview (11/2010): EF 65%, normal study.  Last seen by Dr. Verne Carrow 04/2012.    He is doing well.  He is not exercising as much and does not have as much energy.  The patient denies chest pain, shortness of breath, syncope, orthopnea, PND or significant pedal edema.   Recent Labs: 06/22/2012: ALT 25; HDL 33.90*; LDL (calc) 82   Wt Readings from Last 3 Encounters:  02/02/13 206 lb (93.441 kg)  05/10/12 209 lb (94.802 kg)  06/30/11 203 lb (92.08 kg)     Past Medical History  Diagnosis Date  . Coronary artery disease   . Hyperlipidemia   . Hypothyroidism   . Tobacco abuse   . S/P coronary artery stent placement 2005    RCA and LCX  . Normal nuclear stress test 2012  . Claudication     Normal ABI's in 2006    Current Outpatient Prescriptions  Medication Sig Dispense Refill  . aspirin 81 MG tablet Take 81 mg by mouth daily.        Marland Kitchen levothyroxine (SYNTHROID, LEVOTHROID) 88 MCG tablet TAKE 1 TABLET BY MOUTH EVERY DAY  30 tablet  0  . simvastatin (ZOCOR) 40 MG tablet TAKE 1 TABLET BY MOUTH AT BEDTIME  30 tablet  6   No current facility-administered medications for this visit.    Allergies:   Plavix   Social History:  The patient  reports that he has been smoking.  He does not have any smokeless tobacco history on file. He reports that he does not drink alcohol or use illicit  drugs.   Family History:  The patient's family history includes Heart attack in his brother; Hypertension in his father and mother; Stroke in his father and mother.   ROS:  Please see the history of present illness.      All other systems reviewed and negative.   PHYSICAL EXAM: VS:  BP 120/82  Pulse 82  Ht 6' (1.829 m)  Wt 206 lb (93.441 kg)  BMI 27.93 kg/m2 Well nourished, well developed, in no acute distress HEENT: normal Neck: no JVD Vascular:  No carotid bruits Cardiac:  normal S1, S2; RRR; no murmur Lungs:  clear to auscultation bilaterally, no wheezing, rhonchi or rales Abd: soft, nontender, no hepatomegaly Ext: no edema Skin: warm and dry Neuro:  CNs 2-12 intact, no focal abnormalities noted  EKG:  NSR, HR 82, normal axis, no change from prior tracings     ASSESSMENT AND PLAN:  1. CAD:  No angina.  Continue ASA and statin.  2. Hyperlipidemia:  Recent lipids optimal.  Continue current Rx. 3. Hypothyroidism:  He has not seen his PCP for f/u in a long time.  He has noted lower energy.  Check TSH.  F/u with primary care.  4. Tobacco Abuse:  We discussed the importance  of cessation and different strategies for quitting.    5. Disposition:  F/u with Dr. Verne Carrow 6 mos.  Signed, Tereso Newcomer, PA-C  02/02/2013 9:16 AM

## 2013-02-07 ENCOUNTER — Telehealth: Payer: Self-pay | Admitting: Physician Assistant

## 2013-02-07 NOTE — Telephone Encounter (Signed)
pt notified about lab results normal and faxed to Dr. Juleen China today. Pt verbalized understanding

## 2013-02-07 NOTE — Telephone Encounter (Signed)
rtnd pt's call now lmom on both cell and home #'s for test results

## 2013-02-07 NOTE — Telephone Encounter (Signed)
New problem:  Pt states he is calling to hear his recent test results. Please advise

## 2013-02-07 NOTE — Telephone Encounter (Signed)
Follow up ° °Pt returned call for results °

## 2013-02-23 ENCOUNTER — Other Ambulatory Visit: Payer: Self-pay | Admitting: Cardiovascular Disease

## 2013-02-23 NOTE — Telephone Encounter (Signed)
Is Dr Clifton James going to continue to refill this or should I send to pcp? Please advise. Thanks, MI

## 2013-02-26 NOTE — Telephone Encounter (Signed)
This should be filled by primary care.  See refill note dated 01/19/13 and office visit with Tereso Newcomer ,PA on 11/14

## 2013-05-27 ENCOUNTER — Other Ambulatory Visit: Payer: Self-pay | Admitting: Cardiovascular Disease

## 2013-07-29 ENCOUNTER — Other Ambulatory Visit: Payer: Self-pay | Admitting: Cardiovascular Disease

## 2013-08-08 ENCOUNTER — Encounter: Payer: Self-pay | Admitting: Cardiovascular Disease

## 2013-08-08 ENCOUNTER — Ambulatory Visit (INDEPENDENT_AMBULATORY_CARE_PROVIDER_SITE_OTHER): Payer: 59 | Admitting: Cardiovascular Disease

## 2013-08-08 VITALS — BP 120/70 | HR 76 | Ht 71.0 in | Wt 208.0 lb

## 2013-08-08 DIAGNOSIS — E785 Hyperlipidemia, unspecified: Secondary | ICD-10-CM

## 2013-08-08 DIAGNOSIS — F17200 Nicotine dependence, unspecified, uncomplicated: Secondary | ICD-10-CM

## 2013-08-08 DIAGNOSIS — Z720 Tobacco use: Secondary | ICD-10-CM

## 2013-08-08 DIAGNOSIS — I2510 Atherosclerotic heart disease of native coronary artery without angina pectoris: Secondary | ICD-10-CM

## 2013-08-08 LAB — LIPID PANEL
CHOL/HDL RATIO: 4
Cholesterol: 164 mg/dL (ref 0–200)
HDL: 37.8 mg/dL — ABNORMAL LOW (ref >39.00)
LDL CALC: 81 mg/dL (ref 0–99)
Triglycerides: 224 mg/dL — ABNORMAL HIGH (ref 0.0–149.0)
VLDL: 44.8 mg/dL — AB (ref 0.0–40.0)

## 2013-08-08 LAB — HEPATIC FUNCTION PANEL
ALBUMIN: 4.5 g/dL (ref 3.5–5.2)
ALK PHOS: 76 U/L (ref 39–117)
ALT: 21 U/L (ref 0–53)
AST: 19 U/L (ref 0–37)
BILIRUBIN DIRECT: 0.1 mg/dL (ref 0.0–0.3)
Total Bilirubin: 0.6 mg/dL (ref 0.2–1.2)
Total Protein: 7.3 g/dL (ref 6.0–8.3)

## 2013-08-08 NOTE — Patient Instructions (Signed)
Your physician wants you to follow-up in:  12 months.  You will receive a reminder letter in the mail two months in advance. If you don't receive a letter, please call our office to schedule the follow-up appointment.   

## 2013-08-08 NOTE — Progress Notes (Signed)
History of Present Illness: 63 yo WM with history of CAD, HLD, tobacco abuse, hypothyroidism here today for cardiac follow up. He has been followed in the past by Dr. Doreatha Lew. He had a NSTEMI in January 2005 with a Cypher DES (3.0 x 51mm) placed in the occluded Circumflex and a Cypher DES (3.0 x 18 mm) placed in the RCA. He has had no further caths since then. Stress myoview in July 2012 showed no ischemia.   He is here today for follow up. He is doing well. No chest pain. He continues to smoke 1.5 ppd. He is staying active. He does yard work.  He is tolerating his medicines.   Primary Care Physician: Dr. Wilson Singer  Last Lipid Profile:  Lipid Panel     Component Value Date/Time   CHOL 144 06/22/2012 0856   TRIG 142.0 06/22/2012 0856   HDL 33.90* 06/22/2012 0856   CHOLHDL 4 06/22/2012 0856   VLDL 28.4 06/22/2012 0856   LDLCALC 82 06/22/2012 0856     Past Medical History  Diagnosis Date  . Coronary artery disease   . Hyperlipidemia   . Hypothyroidism   . Tobacco abuse   . S/P coronary artery stent placement 2005    RCA and LCX  . Normal nuclear stress test 2012  . Claudication     Normal ABI's in 2006    Past Surgical History  Procedure Laterality Date  . Coronary stent placement  04/19/2003    STENT PLACEMENT IN THE LEFT CIRCUMFLEX CORONARY AND RIGHT CORONARY ARTERY. MILD RESIDUAL STENOSIS IN THE MID PORTION OF THE LEFT ANTERIOR DESCENDING  . Cardiac catheterization  04/16/2003    NORMAL. EF 60%. TOTALLY OCCLUDED LEFT CIRCUMFLEX WITH SEVERE DISEASE, AND ULCERATED PLAQUE IN THE PROXIMAL RIGHT CORONARY ARTERY AND MILD ATHERSCLEROSIS IN THE LEFT ANTERIOR DESCENDING  . Cardiovascular stress test  2009  . Appendectomy      Current Outpatient Prescriptions  Medication Sig Dispense Refill  . aspirin 81 MG tablet Take 81 mg by mouth daily.        . simvastatin (ZOCOR) 40 MG tablet TAKE 1 TABLET BY MOUTH AT BEDTIME  30 tablet  1   No current facility-administered medications for this visit.      Allergies  Allergen Reactions  . Plavix [Clopidogrel Bisulfate] Rash    History   Social History  . Marital Status: Married    Spouse Name: N/A    Number of Children: 2  . Years of Education: N/A   Occupational History  . Sales Rep    Social History Main Topics  . Smoking status: Current Every Day Smoker -- 1.00 packs/day  . Smokeless tobacco: Not on file  . Alcohol Use: No  . Drug Use: No  . Sexual Activity: Yes   Other Topics Concern  . Not on file   Social History Narrative  . No narrative on file    Family History  Problem Relation Age of Onset  . Hypertension Mother   . Stroke Mother   . Hypertension Father   . Stroke Father   . Heart attack Brother     STENT    Review of Systems:  As stated in the HPI and otherwise negative.   BP 120/70  Pulse 76  Ht 5\' 11"  (1.803 m)  Wt 208 lb (94.348 kg)  BMI 29.02 kg/m2  Physical Examination: General: Well developed, well nourished, NAD HEENT: OP clear, mucus membranes moist SKIN: warm, dry. No rashes. Neuro: No focal  deficits Musculoskeletal: Muscle strength 5/5 all ext Psychiatric: Mood and affect normal Neck: No JVD, no carotid bruits, no thyromegaly, no lymphadenopathy. Lungs:Clear bilaterally, no wheezes, rhonci, crackles Cardiovascular: Regular rate and rhythm. No murmurs, gallops or rubs. Abdomen:Soft. Bowel sounds present. Non-tender.  Extremities: No lower extremity edema. Pulses are 2 + in the bilateral DP/PT.  Assessment and Plan:   1. CAD: Stable. Will continue ASA and statin.   2. Tobacco abuse: Smoking cessation encouraged. We have spent 10 minutes today reviewing a plan to stop smoking. He will try a vaporizer alternating with several cigarettes per day. If he cannot stop smoking, will start Chantix.   3. Hyperlipidemia: Continue statin. Will arrange lipids and LFTs today.

## 2013-09-25 ENCOUNTER — Other Ambulatory Visit: Payer: Self-pay | Admitting: Cardiovascular Disease

## 2014-01-25 ENCOUNTER — Encounter: Payer: Self-pay | Admitting: Cardiovascular Disease

## 2014-03-21 ENCOUNTER — Other Ambulatory Visit: Payer: Self-pay | Admitting: Cardiovascular Disease

## 2014-04-23 ENCOUNTER — Ambulatory Visit (INDEPENDENT_AMBULATORY_CARE_PROVIDER_SITE_OTHER): Payer: PPO | Admitting: Family Medicine

## 2014-04-23 ENCOUNTER — Encounter: Payer: Self-pay | Admitting: Family Medicine

## 2014-04-23 VITALS — BP 135/79 | HR 92 | Temp 98.6°F | Resp 18 | Ht 70.0 in | Wt 200.0 lb

## 2014-04-23 DIAGNOSIS — E039 Hypothyroidism, unspecified: Secondary | ICD-10-CM

## 2014-04-23 DIAGNOSIS — Z1211 Encounter for screening for malignant neoplasm of colon: Secondary | ICD-10-CM | POA: Insufficient documentation

## 2014-04-23 DIAGNOSIS — H6992 Unspecified Eustachian tube disorder, left ear: Secondary | ICD-10-CM

## 2014-04-23 DIAGNOSIS — I2510 Atherosclerotic heart disease of native coronary artery without angina pectoris: Secondary | ICD-10-CM

## 2014-04-23 DIAGNOSIS — H6982 Other specified disorders of Eustachian tube, left ear: Secondary | ICD-10-CM

## 2014-04-23 DIAGNOSIS — E785 Hyperlipidemia, unspecified: Secondary | ICD-10-CM

## 2014-04-23 LAB — BASIC METABOLIC PANEL
BUN: 12 mg/dL (ref 6–23)
CALCIUM: 9.8 mg/dL (ref 8.4–10.5)
CO2: 28 meq/L (ref 19–32)
CREATININE: 0.96 mg/dL (ref 0.40–1.50)
Chloride: 107 mEq/L (ref 96–112)
GFR: 83.51 mL/min (ref >60.00)
Glucose, Bld: 106 mg/dL — ABNORMAL HIGH (ref 70–99)
POTASSIUM: 4.2 meq/L (ref 3.5–5.1)
Sodium: 140 mEq/L (ref 135–145)

## 2014-04-23 LAB — TSH: TSH: 1.33 u[IU]/mL (ref 0.35–4.50)

## 2014-04-23 MED ORDER — FLUTICASONE PROPIONATE 50 MCG/ACT NA SUSP: 2.0000 | Freq: Every day | NASAL | Status: DC

## 2014-04-23 NOTE — Progress Notes (Signed)
Office Note 04/23/2014  CC:  Chief Complaint  Patient presents with  . Establish Care    Dr. Neta Mends at [facility] ( insurance didn't cover anymore )    HPI:  Lawrence Hunter is a 64 y.o. White male who is here to establish care and discuss ear problem. Patient's most recent primary MD: Dr. Neta Mends (no longer covered by pt's insurance)--he saw him about 2 times per pt report. Old records in [facility] EMR were reviewed prior to or during today's visit.  Pt states he last had blood work 02/2014 and his TSH was up so his dose was adjusted up from 88 mcg to 100 mcg qd.  Pt has c/o about his left ear, hearing impairment worse compared to baseline for last 2 mo: has had hearing impairment, hearing aids x 2 yrs.  He recently had increased ear discomfort and was found to have an ear infection that was treated with ear drops.  He felt like the infection cleared but his impaired hearing continued and upon return to Dr. Neta Mends he was treated for eust tub dysf with 5d of prednisone, pt says nothing improved. Uses OTC saline sometimes, was on nasal steroid at one point but unclear why he got off it.  No cough, no ST.  No dizziness/vertigo.   Past Medical History  Diagnosis Date  . Coronary artery disease   . Hyperlipidemia   . Hypothyroidism   . Tobacco abuse   . S/P coronary artery stent placement 2005    RCA and LCX  . Normal nuclear stress test 2012  . Claudication     Normal ABI's in 2006  . Nephrolithiasis     passed one stone approx 2008; no prob since (saw urologist briefly)  . Prostatitis     When pt in 45s and 5s; saw Dr. Gaynelle Arabian and eventually got a TURP per pt's description.  No probs since then.    Past Surgical History  Procedure Laterality Date  . Coronary stent placement  04/19/2003    STENT PLACEMENT IN THE LEFT CIRCUMFLEX CORONARY AND RIGHT CORONARY ARTERY. MILD RESIDUAL STENOSIS IN THE MID PORTION OF THE LEFT ANTERIOR DESCENDING  . Cardiac catheterization   04/16/2003    NORMAL. EF 60%. TOTALLY OCCLUDED LEFT CIRCUMFLEX WITH SEVERE DISEASE, AND ULCERATED PLAQUE IN THE PROXIMAL RIGHT CORONARY ARTERY AND MILD ATHERSCLEROSIS IN THE LEFT ANTERIOR DESCENDING  . Cardiovascular stress test  2009;2012    2012 normal nuclear stress test  . Appendectomy      Family History  Problem Relation Age of Onset  . Hypertension Mother   . Stroke Mother   . Hypertension Father   . Stroke Father   . Heart attack Brother     STENT    History   Social History  . Marital Status: Married    Spouse Name: N/A    Number of Children: 2  . Years of Education: N/A   Occupational History  . Sales Rep    Social History Main Topics  . Smoking status: Current Every Day Smoker -- 1.00 packs/day  . Smokeless tobacco: Never Used  . Alcohol Use: No  . Drug Use: No  . Sexual Activity: Yes   Other Topics Concern  . Not on file   Social History Narrative   Married, 1 son in [place] and one in [place].   Lives in [place].   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   Tob: 40  pack-yr hx, current as of 04/2014.   Alcohol: social/rare.    Outpatient Encounter Prescriptions as of 04/23/2014  Medication Sig  . aspirin 81 MG tablet Take 81 mg by mouth daily.    Marland Kitchen levothyroxine (SYNTHROID, LEVOTHROID) 100 MCG tablet Take 100 mcg by mouth.  . simvastatin (ZOCOR) 40 MG tablet TAKE 1 TABLET BY MOUTH AT BEDTIME  . fluticasone (FLONASE) 50 MCG/ACT nasal spray Place 2 sprays into both nostrils daily.    Allergies  Allergen Reactions  . Plavix [Clopidogrel Bisulfate] Rash    ROS Review of Systems  Constitutional: Negative for fever and fatigue.  HENT: Positive for congestion (nasal) and hearing loss (>>left ear lately compared to baseline impairment). Negative for sore throat.   Eyes: Negative for visual disturbance.  Respiratory: Negative for cough.   Cardiovascular: Negative for chest pain.   Gastrointestinal: Negative for nausea and abdominal pain.  Genitourinary: Negative for dysuria.  Musculoskeletal: Negative for back pain and joint swelling.  Skin: Negative for rash.  Neurological: Negative for weakness and headaches.  Hematological: Negative for adenopathy.    PE; Blood pressure 135/79, pulse 92, temperature 98.6 F (37 C), temperature source Temporal, resp. rate 18, height 5\' 10"  (1.778 m), weight 200 lb (90.719 kg), SpO2 97 %. Gen: Alert, well appearing.  Patient is oriented to person, place, time, and situation. ENT: Ears: EACs clear, normal epithelium.  TMs with good light reflex and landmarks bilaterally.  Eyes: no injection, icteris, swelling, or exudate.  EOMI, PERRLA. Nose: no drainage but some turbinate edema and injection with dried mucous in nares L>R is noted.  No paranasal sinus tenderness or swelling.   Mouth: lips without lesion/swelling.  Oral mucosa pink and moist.  Dentition intact and without obvious caries or gingival swelling.  Oropharynx without erythema, exudate, or swelling.  CV: RRR, no m/r/g.   LUNGS: CTA bilat, nonlabored resps, good aeration in all lung fields. EXT: no clubbing, cyanosis, or edema.   Pertinent labs:  None today  ASSESSMENT AND PLAN:   New pt; obtain old records.  1) Left ear eustachian tube dysfunction: leading to poorer than normal hearing.  I encouraged him to get back on daily flonase and he'll restart daily OTC claritin that he recalls helped in the past.  He does have some chronic rhinitis that looks to be the culprit of his ETD.  2) Hypothyroidism: sounds like he is due for recheck of TSH since a dose change/abnl TSH 02/2014. TSH drawn today.  3) Hyperlipidemia: tolerating statin.  Get old records to see if recent FLP done via Dr. Lorrin Jackson office (most recent in EMR is 07/2013).  An After Visit Summary was printed and given to the patient.  Return in about 4 months (around 08/22/2014) for routine chronic  illness f/u (30 min-fasting).

## 2014-04-24 ENCOUNTER — Telehealth: Payer: Self-pay | Admitting: Family Medicine

## 2014-04-24 NOTE — Telephone Encounter (Signed)
emmi mailed  °

## 2014-06-24 ENCOUNTER — Other Ambulatory Visit: Payer: Self-pay | Admitting: *Deleted

## 2014-06-24 MED ORDER — SIMVASTATIN 40 MG PO TABS: 40.0000 mg | ORAL_TABLET | Freq: Every day | ORAL | Status: DC

## 2014-07-15 ENCOUNTER — Other Ambulatory Visit: Payer: Self-pay | Admitting: Cardiovascular Disease

## 2014-08-21 ENCOUNTER — Ambulatory Visit (INDEPENDENT_AMBULATORY_CARE_PROVIDER_SITE_OTHER): Payer: PPO

## 2014-08-21 ENCOUNTER — Encounter: Payer: Self-pay | Admitting: Family Medicine

## 2014-08-21 ENCOUNTER — Ambulatory Visit (INDEPENDENT_AMBULATORY_CARE_PROVIDER_SITE_OTHER): Payer: PPO | Admitting: Family Medicine

## 2014-08-21 VITALS — BP 104/72 | HR 103 | Temp 98.0°F | Resp 16 | Wt 198.0 lb

## 2014-08-21 DIAGNOSIS — Z23 Encounter for immunization: Secondary | ICD-10-CM

## 2014-08-21 DIAGNOSIS — E039 Hypothyroidism, unspecified: Secondary | ICD-10-CM | POA: Diagnosis not present

## 2014-08-21 DIAGNOSIS — E785 Hyperlipidemia, unspecified: Secondary | ICD-10-CM

## 2014-08-21 DIAGNOSIS — H6983 Other specified disorders of Eustachian tube, bilateral: Secondary | ICD-10-CM

## 2014-08-21 DIAGNOSIS — F17200 Nicotine dependence, unspecified, uncomplicated: Secondary | ICD-10-CM

## 2014-08-21 DIAGNOSIS — H6993 Unspecified Eustachian tube disorder, bilateral: Secondary | ICD-10-CM

## 2014-08-21 DIAGNOSIS — H9193 Unspecified hearing loss, bilateral: Secondary | ICD-10-CM

## 2014-08-21 DIAGNOSIS — J322 Chronic ethmoidal sinusitis: Secondary | ICD-10-CM

## 2014-08-21 MED ORDER — CLINDAMYCIN HCL 300 MG PO CAPS: 300.0000 mg | ORAL_CAPSULE | Freq: Three times a day (TID) | ORAL | Status: DC

## 2014-08-21 NOTE — Progress Notes (Signed)
Pre visit review using our clinic review tool, if applicable. No additional management support is needed unless otherwise documented below in the visit note. 

## 2014-08-21 NOTE — Patient Instructions (Signed)
Call in 2 weeks when finished with antibiotics and give report of how you are doing with your sinuses and ears.

## 2014-08-21 NOTE — Progress Notes (Signed)
OFFICE VISIT  09/01/2014   CC:  Chief Complaint  Patient presents with  . Follow-up    4 month f/u. Pt is fasting.   HPI:    Patient is a 64 y.o. Caucasian male who presents for 4 mo f/u hypothyroidism, hyperlipidemia, CAD in native artery w/hx of stent placement 2005, tobacco dependence, eustachian tube dysfunction with chronic rhinitis.   He continues to smoke, not interested in quitting at this time. TAkes T4 correctly.  Since his last visit I reviewed old records and his [place] 02/2014 was: trig 159, HDL 44, LDL 82.  Still feeling like nose is stuffed, esp left, he blows out greenish/thick mucous and admits to "a little" PND.  No facial pain. Ears do feel full, L>R, +impaired hearing.  No ringing.  Says amoxil helped in the past.   This feeling in his sinuses has been constant for about 6 months.  Has never seen an ENT.  Has never had an x-ray of his sinuses.  He has been using flonase and it has helped some.  No HA or ST or sneezing.  No itchy/runny eyes. He is cutting back on cigs but still smoking--not ready to commit to complete cessation trial at this time. Saw audiologist and got hearing aids at audiologist in [place] in the past.   Past Medical History  Diagnosis Date  . Coronary artery disease   . Hyperlipidemia   . Hypothyroidism   . Tobacco abuse   . S/P coronary artery stent placement 2005    RCA and LCX  . Normal nuclear stress test 2012  . Claudication     Normal ABI's in 2006  . Nephrolithiasis     passed one stone approx 2008; no prob since (saw urologist briefly)  . Prostatitis     When pt in 40s and 27s; saw Dr. Gaynelle Arabian and eventually got a TURP per pt's description.  No probs since then.    Past Surgical History  Procedure Laterality Date  . Coronary stent placement  04/19/2003    STENT PLACEMENT IN THE LEFT CIRCUMFLEX CORONARY AND RIGHT CORONARY ARTERY. MILD RESIDUAL STENOSIS IN THE MID PORTION OF THE LEFT ANTERIOR DESCENDING  . Cardiac  catheterization  04/16/2003    NORMAL. EF 60%. TOTALLY OCCLUDED LEFT CIRCUMFLEX WITH SEVERE DISEASE, AND ULCERATED PLAQUE IN THE PROXIMAL RIGHT CORONARY ARTERY AND MILD ATHERSCLEROSIS IN THE LEFT ANTERIOR DESCENDING  . Cardiovascular stress test  2009;2012    2012 normal nuclear stress test  . Appendectomy      Outpatient Prescriptions Prior to Visit  Medication Sig Dispense Refill  . aspirin 81 MG tablet Take 81 mg by mouth daily.      . fluticasone (FLONASE) 50 MCG/ACT nasal spray Place 2 sprays into both nostrils daily. 16 g 11  . levothyroxine (SYNTHROID, LEVOTHROID) 100 MCG tablet Take 100 mcg by mouth.    . simvastatin (ZOCOR) 40 MG tablet Take 1 tablet (40 mg total) by mouth at bedtime. 30 tablet 6   No facility-administered medications prior to visit.    Allergies  Allergen Reactions  . Plavix [Clopidogrel Bisulfate] Rash    ROS As per HPI  PE: Blood pressure 104/72, pulse 103, temperature 98 F (36.7 C), temperature source Oral, resp. rate 16, weight 198 lb (89.812 kg), SpO2 97 %. VS: noted--normal. Gen: alert, NAD, NONTOXIC APPEARING. HEENT: eyes without injection, drainage, or swelling.  Ears: EACs clear, TMs with normal light reflex and landmarks.  Nose: Clear rhinorrhea, with some dried, crusty exudate  adherent to mildly injected mucosa.  No purulent d/c.  No paranasal sinus TTP.  No facial swelling.  Throat and mouth without focal lesion.  No pharyngial swelling, erythema, or exudate.   Neck: supple, no LAD.   LUNGS: CTA bilat, nonlabored resps.   CV: RRR, no m/r/g. EXT: no c/c/e SKIN: no rash  LABS:   Lab Results  Component Value Date   TSH 1.33 04/23/2014   Lab Results  Component Value Date   CREATININE 0.96 04/23/2014   BUN 12 04/23/2014   NA 140 04/23/2014   K 4.2 04/23/2014   CL 107 04/23/2014   CO2 28 04/23/2014   Lab Results  Component Value Date   ALT 21 08/08/2013   AST 19 08/08/2013   ALKPHOS 76 08/08/2013   BILITOT 0.6 08/08/2013    Lab Results  Component Value Date   CHOL 164 08/08/2013   Lab Results  Component Value Date   HDL 37.80* 08/08/2013   Lab Results  Component Value Date   LDLCALC 81 08/08/2013   Lab Results  Component Value Date   TRIG 224.0* 08/08/2013   Lab Results  Component Value Date   CHOLHDL 4 08/08/2013   IMPRESSION AND PLAN:  1) Chronic rhinitis/rhinosinusitis: clindamycin 300 mg tid x 14d. OTC probiotic qd. Sinus x-rays ordered. Call if not improved in 2 wks and will refer him to ENT for further evaluation and management.  2) Hypothyroidism: TSH monitoring UTD, compliant with med, takes it correctly.  3) Hyperlipidemia: tolerating statin.  No labs today, although he is due for FLP recheck in near future when his sinus issues are resolved.  4) Tobacco dependence: encouraged cessation, pt willing to try to cut back slowly but not quit completely at this time.  5) Preventative health care: Tdap booster and Prevnar 13 IM given today. He will pursue screening colonoscopy in near future but he wishes to hold off for now and wait until his sinus issues are better/resolved.  An After Visit Summary was printed and given to the patient.  FOLLOW UP: Return in about 6 months (around 02/20/2015) for routine chronic illness f/u (fasting).Marland Kitchen

## 2014-09-01 ENCOUNTER — Encounter: Payer: Self-pay | Admitting: Family Medicine

## 2014-09-06 ENCOUNTER — Telehealth: Payer: Self-pay | Admitting: *Deleted

## 2014-09-06 NOTE — Telephone Encounter (Signed)
Pt LMOM stating that he was advised to call back in two weeks and let Dr. Anitra Lauth know how he was feeling. He stated that he is feeling better, symptoms have not resolved but are almost improved. He did mention that he is still having trouble hearing out of his left ear. Otherwise he has been doing well.

## 2014-09-11 NOTE — Progress Notes (Signed)
Chief Complaint  Patient presents with  . Coronary Artery Disease     History of Present Illness: 64 yo WM with history of CAD, HLD, tobacco abuse, hypothyroidism here today for cardiac follow up. He has been followed in the past by Dr. Doreatha Lew. He had a NSTEMI in January 2005 with a Cypher DES (3.0 x 19mm) placed in the occluded Circumflex and a Cypher DES (3.0 x 18 mm) placed in the RCA. He has had no further caths since then. Stress myoview in July 2012 showed no ischemia.   He is here today for follow up. He is doing well. No chest pain. He continues to smoke 1.5 ppd. He is staying active. He does yard work.  He is tolerating his medicines.   Primary Care Physician: Dr. Anitra Lauth   Past Medical History  Diagnosis Date  . Coronary artery disease   . Hyperlipidemia   . Hypothyroidism   . Tobacco abuse   . S/P coronary artery stent placement 2005    RCA and LCX  . Normal nuclear stress test 2012  . Claudication     Normal ABI's in 2006  . Nephrolithiasis     passed one stone approx 2008; no prob since (saw urologist briefly)  . Prostatitis     When pt in 32s and 60s; saw Dr. Gaynelle Arabian and eventually got a TURP per pt's description.  No probs since then.    Past Surgical History  Procedure Laterality Date  . Coronary stent placement  04/19/2003    STENT PLACEMENT IN THE LEFT CIRCUMFLEX CORONARY AND RIGHT CORONARY ARTERY. MILD RESIDUAL STENOSIS IN THE MID PORTION OF THE LEFT ANTERIOR DESCENDING  . Cardiac catheterization  04/16/2003    NORMAL. EF 60%. TOTALLY OCCLUDED LEFT CIRCUMFLEX WITH SEVERE DISEASE, AND ULCERATED PLAQUE IN THE PROXIMAL RIGHT CORONARY ARTERY AND MILD ATHERSCLEROSIS IN THE LEFT ANTERIOR DESCENDING  . Cardiovascular stress test  2009;2012    2012 normal nuclear stress test  . Appendectomy      Current Outpatient Prescriptions  Medication Sig Dispense Refill  . aspirin 81 MG tablet Take 81 mg by mouth daily.      . clindamycin (CLEOCIN) 300 MG capsule Take  1 capsule (300 mg total) by mouth 3 (three) times daily. 42 capsule 0  . fluticasone (FLONASE) 50 MCG/ACT nasal spray Place 2 sprays into both nostrils daily. 16 g 11  . levothyroxine (SYNTHROID, LEVOTHROID) 100 MCG tablet Take 100 mcg by mouth.    . simvastatin (ZOCOR) 40 MG tablet Take 1 tablet (40 mg total) by mouth at bedtime. 30 tablet 6   No current facility-administered medications for this visit.    Allergies  Allergen Reactions  . Plavix [Clopidogrel Bisulfate] Rash    History   Social History  . Marital Status: Married    Spouse Name: N/A  . Number of Children: 2  . Years of Education: N/A   Occupational History  . Sales Rep    Social History Main Topics  . Smoking status: Current Every Day Smoker -- 1.00 packs/day  . Smokeless tobacco: Never Used  . Alcohol Use: No  . Drug Use: No  . Sexual Activity: Yes   Other Topics Concern  . Not on file   Social History Narrative   Married, 1 son in [place] and one in [place].   Lives in [place].   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   Tob: 40 pack-yr hx,  current as of 04/2014.   Alcohol: social/rare.    Family History  Problem Relation Age of Onset  . Hypertension Mother   . Stroke Mother   . Hypertension Father   . Stroke Father   . Heart attack Brother     STENT    Review of Systems:  As stated in the HPI and otherwise negative.   BP 120/78 mmHg  Pulse 79  Ht 6' (1.829 m)  Wt 198 lb 6.4 oz (89.994 kg)  BMI 26.90 kg/m2  Physical Examination: General: Well developed, well nourished, NAD HEENT: OP clear, mucus membranes moist SKIN: warm, dry. No rashes. Neuro: No focal deficits Musculoskeletal: Muscle strength 5/5 all ext Psychiatric: Mood and affect normal Neck: No JVD, no carotid bruits, no thyromegaly, no lymphadenopathy. Lungs:Clear bilaterally, no wheezes, rhonci, crackles Cardiovascular: Regular rate and rhythm. No murmurs,  gallops or rubs. Abdomen:Soft. Bowel sounds present. Non-tender.  Extremities: No lower extremity edema. Pulses are 2 + in the bilateral DP/PT.  EKG:  EKG is ordered today. The ekg ordered today demonstrates NSR, rate 79 bpm.   Recent Labs: 04/23/2014: BUN 12; Creatinine, Ser 0.96; Potassium 4.2; Sodium 140; TSH 1.33   Lipid Panel    Component Value Date/Time   CHOL 164 08/08/2013 0939   TRIG 224.0* 08/08/2013 0939   HDL 37.80* 08/08/2013 0939   CHOLHDL 4 08/08/2013 0939   VLDL 44.8* 08/08/2013 0939   LDLCALC 81 08/08/2013 0939     Wt Readings from Last 3 Encounters:  09/12/14 198 lb 6.4 oz (89.994 kg)  08/21/14 198 lb (89.812 kg)  04/23/14 200 lb (90.719 kg)     Other studies Reviewed: Additional studies/ records that were reviewed today include: . Review of the above records demonstrates:    Assessment and Plan:   1. CAD: Stable. Will continue ASA and statin. Will arrange exercise stress test for screening since he is still smoking. Check BMET today  2. Tobacco abuse: Smoking cessation encouraged. We have spent 10 minutes today reviewing a plan to stop smoking. He will try a vaporizer alternating with several cigarettes per day. If he cannot stop smoking, will start Chantix.   3. Hyperlipidemia: Lipids controlled. Continue statin. Repeat lipids and LFTs today.    Current medicines are reviewed at length with the patient today.  The patient does not have concerns regarding medicines.  The following changes have been made:  no change  Labs/ tests ordered today include:   Orders Placed This Encounter  Procedures  . Lipid Profile  . Hepatic function panel  . Basic Metabolic Panel (BMET)  . Exercise Tolerance Test  . EKG 12-Lead    Disposition:   FU with me in 12 months  Signed, Lauree Chandler, MD 09/12/2014 12:02 PM    [facility] [address], [place], [place]  [address] Phone: [phone]; Fax: [phone]

## 2014-09-12 ENCOUNTER — Encounter: Payer: Self-pay | Admitting: Cardiovascular Disease

## 2014-09-12 ENCOUNTER — Ambulatory Visit (INDEPENDENT_AMBULATORY_CARE_PROVIDER_SITE_OTHER): Payer: PPO | Admitting: Cardiovascular Disease

## 2014-09-12 VITALS — BP 120/78 | HR 79 | Ht 72.0 in | Wt 198.4 lb

## 2014-09-12 DIAGNOSIS — Z720 Tobacco use: Secondary | ICD-10-CM

## 2014-09-12 DIAGNOSIS — I2510 Atherosclerotic heart disease of native coronary artery without angina pectoris: Secondary | ICD-10-CM

## 2014-09-12 DIAGNOSIS — E785 Hyperlipidemia, unspecified: Secondary | ICD-10-CM

## 2014-09-12 LAB — HEPATIC FUNCTION PANEL
ALT: 25 U/L (ref 0–53)
AST: 22 U/L (ref 0–37)
Albumin: 4.4 g/dL (ref 3.5–5.2)
Alkaline Phosphatase: 94 U/L (ref 39–117)
Bilirubin, Direct: 0.2 mg/dL (ref 0.0–0.3)
Total Bilirubin: 1 mg/dL (ref 0.2–1.2)
Total Protein: 7.2 g/dL (ref 6.0–8.3)

## 2014-09-12 LAB — BASIC METABOLIC PANEL
BUN: 14 mg/dL (ref 6–23)
CALCIUM: 9.5 mg/dL (ref 8.4–10.5)
CO2: 27 mEq/L (ref 19–32)
Chloride: 105 mEq/L (ref 96–112)
Creatinine, Ser: 1.05 mg/dL (ref 0.40–1.50)
GFR: 75.22 mL/min (ref >60.00)
GLUCOSE: 99 mg/dL (ref 70–99)
POTASSIUM: 4 meq/L (ref 3.5–5.1)
Sodium: 138 mEq/L (ref 135–145)

## 2014-09-12 LAB — LIPID PANEL
CHOL/HDL RATIO: 4
CHOLESTEROL: 163 mg/dL (ref 0–200)
HDL: 43.4 mg/dL (ref >39.00)
LDL CALC: 99 mg/dL (ref 0–99)
NONHDL: 119.6
TRIGLYCERIDES: 105 mg/dL (ref 0.0–149.0)
VLDL: 21 mg/dL (ref 0.0–40.0)

## 2014-09-12 NOTE — Patient Instructions (Signed)
Medication Instructions:  Your physician recommends that you continue on your current medications as directed. Please refer to the Current Medication list given to you today.   Labwork:  Lab work to be done today--BMP, Lipid, Liver  Testing/Procedures: Your physician has requested that you have an exercise tolerance test. For further information please visit [email or web address]. Please also follow instruction sheet, as given.    Follow-Up: Your physician wants you to follow-up in: 12 months.  You will receive a reminder letter in the mail two months in advance. If you don't receive a letter, please call our office to schedule the follow-up appointment. al

## 2014-09-19 ENCOUNTER — Other Ambulatory Visit: Payer: Self-pay | Admitting: *Deleted

## 2014-09-19 DIAGNOSIS — E785 Hyperlipidemia, unspecified: Secondary | ICD-10-CM

## 2014-09-19 MED ORDER — ATORVASTATIN CALCIUM 80 MG PO TABS: 80.0000 mg | ORAL_TABLET | Freq: Every day | ORAL | Status: DC

## 2014-10-30 ENCOUNTER — Ambulatory Visit (INDEPENDENT_AMBULATORY_CARE_PROVIDER_SITE_OTHER): Payer: PPO

## 2014-10-30 ENCOUNTER — Encounter: Payer: PPO | Admitting: Physician Assistant

## 2014-10-30 DIAGNOSIS — I2510 Atherosclerotic heart disease of native coronary artery without angina pectoris: Secondary | ICD-10-CM

## 2014-10-30 LAB — EXERCISE TOLERANCE TEST
CHL CUP RESTING HR STRESS: 84 {beats}/min
CHL RATE OF PERCEIVED EXERTION: 16
CSEPED: 6 min
Estimated workload: 7 METS
MPHR: 155 {beats}/min
Peak HR: 148 {beats}/min
Percent HR: 95 %

## 2014-11-15 ENCOUNTER — Telehealth: Payer: Self-pay | Admitting: Family Medicine

## 2014-11-15 MED ORDER — CLINDAMYCIN HCL 300 MG PO CAPS: 300.0000 mg | ORAL_CAPSULE | Freq: Three times a day (TID) | ORAL | Status: DC

## 2014-11-15 NOTE — Telephone Encounter (Signed)
Patient left VM. He is having sinus issues again that Dr. Anitra Lauth has sent Rx for in the past. Patient is requesting the same Rx. Please call him back.

## 2014-11-15 NOTE — Telephone Encounter (Signed)
OK to send in clindamycin 300mg  tid x 14d, #42, no RF--to the pharmacy he wants.-thx

## 2014-11-15 NOTE — Telephone Encounter (Signed)
Left message for pt to call back  °

## 2014-11-15 NOTE — Telephone Encounter (Signed)
Spoke to pt he stated that he seen you awhile back for sinus infection and was given antibiotic. He stated that the symptoms started to improve but once he finished the antibiotic they came back. He stated that he is having sinus pain/pressure and nasal congestion. He stated that he did not have any fever, chills or sweats. He stated that he will be out of town for the next three weeks but if he needs to be seen he might be able to wait. Please advise. Thanks.

## 2014-11-15 NOTE — Telephone Encounter (Signed)
Per pt send Rx to [facility], Rx sent.

## 2014-12-09 ENCOUNTER — Other Ambulatory Visit: Payer: PPO

## 2014-12-10 ENCOUNTER — Other Ambulatory Visit (INDEPENDENT_AMBULATORY_CARE_PROVIDER_SITE_OTHER): Payer: PPO | Admitting: *Deleted

## 2014-12-10 DIAGNOSIS — E785 Hyperlipidemia, unspecified: Secondary | ICD-10-CM

## 2014-12-10 LAB — LIPID PANEL
CHOL/HDL RATIO: 3
Cholesterol: 121 mg/dL (ref 0–200)
HDL: 39.6 mg/dL (ref >39.00)
LDL Cholesterol: 64 mg/dL (ref 0–99)
NonHDL: 81.52
TRIGLYCERIDES: 86 mg/dL (ref 0.0–149.0)
VLDL: 17.2 mg/dL (ref 0.0–40.0)

## 2014-12-10 LAB — HEPATIC FUNCTION PANEL
ALK PHOS: 100 U/L (ref 39–117)
ALT: 22 U/L (ref 0–53)
AST: 19 U/L (ref 0–37)
Albumin: 4.3 g/dL (ref 3.5–5.2)
BILIRUBIN DIRECT: 0.3 mg/dL (ref 0.0–0.3)
BILIRUBIN TOTAL: 1.1 mg/dL (ref 0.2–1.2)
TOTAL PROTEIN: 7 g/dL (ref 6.0–8.3)

## 2014-12-10 NOTE — Addendum Note (Signed)
Addended by: Eulis Foster on: 12/10/2014 08:18 AM   Modules accepted: Orders

## 2015-02-12 ENCOUNTER — Telehealth: Payer: Self-pay | Admitting: Family Medicine

## 2015-02-12 NOTE — Telephone Encounter (Signed)
Patient is having the same symptoms he was having in September. The Rx he took then worked well. Can he get another Rx? Please call him.

## 2015-02-12 NOTE — Telephone Encounter (Signed)
Left message for pt to call back  °

## 2015-02-14 ENCOUNTER — Ambulatory Visit (INDEPENDENT_AMBULATORY_CARE_PROVIDER_SITE_OTHER): Payer: PPO | Admitting: Internal Medicine

## 2015-02-14 ENCOUNTER — Encounter: Payer: Self-pay | Admitting: Internal Medicine

## 2015-02-14 VITALS — BP 126/72 | HR 102 | Temp 98.4°F | Ht 72.0 in | Wt 201.0 lb

## 2015-02-14 DIAGNOSIS — Z955 Presence of coronary angioplasty implant and graft: Secondary | ICD-10-CM | POA: Insufficient documentation

## 2015-02-14 DIAGNOSIS — N200 Calculus of kidney: Secondary | ICD-10-CM | POA: Insufficient documentation

## 2015-02-14 DIAGNOSIS — I739 Peripheral vascular disease, unspecified: Secondary | ICD-10-CM | POA: Insufficient documentation

## 2015-02-14 DIAGNOSIS — I2510 Atherosclerotic heart disease of native coronary artery without angina pectoris: Secondary | ICD-10-CM | POA: Insufficient documentation

## 2015-02-14 DIAGNOSIS — E039 Hypothyroidism, unspecified: Secondary | ICD-10-CM | POA: Insufficient documentation

## 2015-02-14 DIAGNOSIS — J309 Allergic rhinitis, unspecified: Secondary | ICD-10-CM

## 2015-02-14 DIAGNOSIS — N419 Inflammatory disease of prostate, unspecified: Secondary | ICD-10-CM | POA: Insufficient documentation

## 2015-02-14 MED ORDER — PREDNISONE 10 MG PO TABS: ORAL_TABLET | ORAL | Status: DC

## 2015-02-14 MED ORDER — METHYLPREDNISOLONE ACETATE 80 MG/ML IJ SUSP
80.0000 mg | Freq: Once | INTRAMUSCULAR | Status: AC
  Administered 2015-02-14: 80 mg via INTRAMUSCULAR

## 2015-02-14 MED ORDER — CETIRIZINE HCL 10 MG PO TABS: 10.0000 mg | ORAL_TABLET | Freq: Every day | ORAL | Status: DC

## 2015-02-14 NOTE — Patient Instructions (Signed)
You had the steroid shot today  Please take all new medication as prescribed - the prednisone and zyrtec  Please continue all other medications as before, including the flonase  Please have the pharmacy call with any other refills you may need.  Please keep your appointments with your specialists as you may have planned

## 2015-02-14 NOTE — Progress Notes (Signed)
Pre visit review using our clinic review tool, if applicable. No additional management support is needed unless otherwise documented below in the visit note. 

## 2015-02-14 NOTE — Progress Notes (Signed)
Subjective:    Patient ID: Lawrence Hunter, male    DOB: [birth year], 64 y.o.   MRN: [id number]  HPI  Here with 1 wk onset head congestion, with ongoing nasal allergy symptoms with clearish congestion, itch and sneezing, without fever, pain, ST, cough, swelling or wheezing.  Pt denies chest pain, increased sob or doe, wheezing, orthopnea, PND, increased LE swelling, palpitations, dizziness or syncope.  Pt denies new neurological symptoms such as new headache, or facial or extremity weakness or numbness  Did have several days of bushhog work outside recently, seemed to make worse, but re-starting the flonase has helped as well.  Past Medical History  Diagnosis Date  . Coronary artery disease   . Hyperlipidemia   . Hypothyroidism   . Tobacco abuse   . S/P coronary artery stent placement 2005    RCA and LCX  . Normal nuclear stress test 2012  . Claudication ([place])     Normal ABI's in 2006  . Nephrolithiasis     passed one stone approx 2008; no prob since (saw urologist briefly)  . Prostatitis     When pt in 23s and 63s; saw Dr. Gaynelle Arabian and eventually got a TURP per pt's description.  No probs since then.   Past Surgical History  Procedure Laterality Date  . Coronary stent placement  04/19/2003    STENT PLACEMENT IN THE LEFT CIRCUMFLEX CORONARY AND RIGHT CORONARY ARTERY. MILD RESIDUAL STENOSIS IN THE MID PORTION OF THE LEFT ANTERIOR DESCENDING  . Cardiac catheterization  04/16/2003    NORMAL. EF 60%. TOTALLY OCCLUDED LEFT CIRCUMFLEX WITH SEVERE DISEASE, AND ULCERATED PLAQUE IN THE PROXIMAL RIGHT CORONARY ARTERY AND MILD ATHERSCLEROSIS IN THE LEFT ANTERIOR DESCENDING  . Cardiovascular stress test  2009;2012    2012 normal nuclear stress test  . Appendectomy      reports that he has been smoking.  He has never used smokeless tobacco. He reports that he does not drink alcohol or use illicit drugs. family history includes Heart attack in his brother; Hypertension in his father and  mother; Stroke in his father and mother. Allergies  Allergen Reactions  . Plavix [Clopidogrel Bisulfate] Rash   Current Outpatient Prescriptions on File Prior to Visit  Medication Sig Dispense Refill  . aspirin 81 MG tablet Take 81 mg by mouth daily.      Marland Kitchen atorvastatin (LIPITOR) 80 MG tablet Take 1 tablet (80 mg total) by mouth daily. 30 tablet 11  . fluticasone (FLONASE) 50 MCG/ACT nasal spray Place 2 sprays into both nostrils daily. 16 g 11  . levothyroxine (SYNTHROID, LEVOTHROID) 100 MCG tablet Take 100 mcg by mouth.     No current facility-administered medications on file prior to visit.    Review of Systems All otherwise neg per pt       Objective:   Physical Exam BP 126/72 mmHg  Pulse 102  Temp(Src) 98.4 F (36.9 C) (Oral)  Ht 6' (1.829 m)  Wt 201 lb (91.173 kg)  BMI 27.25 kg/m2  SpO2 98% VS noted, not ill appearing Constitutional: Pt appears in no significant distress HENT: Head: NCAT.  Right Ear: External ear normal.  Left Ear: External ear normal.  Bilat tm's with no erythema.  Max sinus areas non tender.  Pharynx with mild erythema, no exudate Eyes: . Pupils are equal, round, and reactive to light. Conjunctivae and EOM are normal Neck: Normal range of motion. Neck supple.  Cardiovascular: Normal rate and regular rhythm.   Pulmonary/Chest: Effort normal and  breath sounds without rales or wheezing.  Neurological: Pt is alert. Not confused , motor grossly intact Skin: Skin is warm. No rash, no LE edema Psychiatric: Pt behavior is normal. No agitation.      Assessment & Plan:

## 2015-02-14 NOTE — Addendum Note (Signed)
Addended by: Lyman Bishop on: 02/14/2015 11:11 AM   Modules accepted: Orders

## 2015-02-20 ENCOUNTER — Encounter: Payer: Self-pay | Admitting: Family Medicine

## 2015-02-20 ENCOUNTER — Ambulatory Visit (INDEPENDENT_AMBULATORY_CARE_PROVIDER_SITE_OTHER): Payer: PPO | Admitting: Family Medicine

## 2015-02-20 VITALS — BP 110/75 | HR 93 | Temp 97.8°F | Resp 16 | Ht 72.0 in | Wt 199.0 lb

## 2015-02-20 DIAGNOSIS — E785 Hyperlipidemia, unspecified: Secondary | ICD-10-CM

## 2015-02-20 DIAGNOSIS — I2510 Atherosclerotic heart disease of native coronary artery without angina pectoris: Secondary | ICD-10-CM | POA: Diagnosis not present

## 2015-02-20 DIAGNOSIS — J329 Chronic sinusitis, unspecified: Secondary | ICD-10-CM | POA: Diagnosis not present

## 2015-02-20 DIAGNOSIS — Z23 Encounter for immunization: Secondary | ICD-10-CM

## 2015-02-20 DIAGNOSIS — F17200 Nicotine dependence, unspecified, uncomplicated: Secondary | ICD-10-CM

## 2015-02-20 DIAGNOSIS — E039 Hypothyroidism, unspecified: Secondary | ICD-10-CM

## 2015-02-20 LAB — TSH: TSH: 1.05 u[IU]/mL (ref 0.35–4.50)

## 2015-02-20 MED ORDER — ZOSTER VACCINE LIVE 19400 UNT/0.65ML ~~LOC~~ SOLR: 0.6500 mL | Freq: Once | SUBCUTANEOUS | Status: DC

## 2015-02-20 NOTE — Progress Notes (Signed)
OFFICE VISIT  02/20/2015   CC:  Chief Complaint  Patient presents with  . Follow-up    Pt is fasting.    HPI:    Patient is a 64 y.o. Caucasian male who presents for 6 mo f/u hypothyroidism, hyperlipidemia, and CAD in native coronary artery w/hx of stent placement 2005, tob dependence. Working on cutting back some on smoking.   No chest pains with regular physical activity (stairs, lifting, etc).  NoND DOE, orthopnea, or PND.  Last week he had to go to the [facility] on-call clinic for rhinosinusitis sx's, got prednisone and feels considerably better.  No abx given.   Past Medical History  Diagnosis Date  . Coronary artery disease   . Hyperlipidemia   . Hypothyroidism   . Tobacco abuse   . S/P coronary artery stent placement 2005    RCA and LCX  . Normal nuclear stress test 2012  . Claudication ([place])     Normal ABI's in 2006  . Nephrolithiasis     passed one stone approx 2008; no prob since (saw urologist briefly)  . Prostatitis     When pt in 56s and 25s; saw Dr. Gaynelle Arabian and eventually got a TURP per pt's description.  No probs since then.    Past Surgical History  Procedure Laterality Date  . Coronary stent placement  04/19/2003    STENT PLACEMENT IN THE LEFT CIRCUMFLEX CORONARY AND RIGHT CORONARY ARTERY. MILD RESIDUAL STENOSIS IN THE MID PORTION OF THE LEFT ANTERIOR DESCENDING  . Cardiac catheterization  04/16/2003    NORMAL. EF 60%. TOTALLY OCCLUDED LEFT CIRCUMFLEX WITH SEVERE DISEASE, AND ULCERATED PLAQUE IN THE PROXIMAL RIGHT CORONARY ARTERY AND MILD ATHERSCLEROSIS IN THE LEFT ANTERIOR DESCENDING  . Cardiovascular stress test  2009;2012    2012 normal nuclear stress test  . Appendectomy      Outpatient Prescriptions Prior to Visit  Medication Sig Dispense Refill  . aspirin 81 MG tablet Take 81 mg by mouth daily.      Marland Kitchen atorvastatin (LIPITOR) 80 MG tablet Take 1 tablet (80 mg total) by mouth daily. 30 tablet 11  . cetirizine (ZYRTEC) 10 MG tablet Take 1 tablet  (10 mg total) by mouth daily. 30 tablet 11  . fluticasone (FLONASE) 50 MCG/ACT nasal spray Place 2 sprays into both nostrils daily. 16 g 11  . levothyroxine (SYNTHROID, LEVOTHROID) 100 MCG tablet Take 100 mcg by mouth.    . predniSONE (DELTASONE) 10 MG tablet 3 tabs by mouth per day for 3 days,2tabs per day for 3 days,1tab per day for 3 days 18 tablet 0   No facility-administered medications prior to visit.    Allergies  Allergen Reactions  . Plavix [Clopidogrel Bisulfate] Rash    ROS As per HPI  PE: Blood pressure 110/75, pulse 93, temperature 97.8 F (36.6 C), temperature source Oral, resp. rate 16, height 6' (1.829 m), weight 199 lb (90.266 kg), SpO2 95 %. Gen: Alert, well appearing.  Patient is oriented to person, place, time, and situation. [id number]: no injection, icteris, swelling, or exudate.  EOMI, PERRLA. Mouth: lips without lesion/swelling.  Oral mucosa pink and moist. Oropharynx without erythema, exudate, or swelling.  Neck: supple/nontender.  No LAD, mass, or TM.  Carotid pulses 2+ bilaterally, without bruits. CV: RRR, no m/r/g.   LUNGS: CTA bilat, nonlabored resps, good aeration in all lung fields. EXT: no clubbing, cyanosis, or edema.   LABS:  Lab Results  Component Value Date   TSH 1.33 04/23/2014    Lab  Results  Component Value Date   CREATININE 1.05 09/12/2014   BUN 14 09/12/2014   NA 138 09/12/2014   K 4.0 09/12/2014   CL 105 09/12/2014   CO2 27 09/12/2014   Lab Results  Component Value Date   ALT 22 12/10/2014   AST 19 12/10/2014   ALKPHOS 100 12/10/2014   BILITOT 1.1 12/10/2014   Lab Results  Component Value Date   CHOL 121 12/10/2014   Lab Results  Component Value Date   HDL 39.60 12/10/2014   Lab Results  Component Value Date   LDLCALC 64 12/10/2014   Lab Results  Component Value Date   TRIG 86.0 12/10/2014   Lab Results  Component Value Date   CHOLHDL 3 12/10/2014    IMPRESSION AND PLAN:  1) Hypothyroidism: The current  medical regimen is effective;  continue present plan and medications. TSH check today.  2) Hyperlipidemia: The current medical regimen is effective;  continue present plan and medications. Recent FLP and AST/ALT 11/2014 were great.  3) CAD, hx of stent: asymptomatic.  Continue ASA and statin and encouraged continued efforts at smoking cessation.     4) Recurrent rhinosinusitis: improved significantly/resolved after last visit/abx 6 mo ago, but recent recurrence of a more allergic type illness: improving appropriately with prednisone.  5) Prev health care; flu vaccine today.  Zostavax rx handed to pt. He is agreeable to screening colonoscopy in the future but wants to wait on referral until some of his insurance issues are ironed out.  6) Tob dependence: discussed gradual cessation techniques, which is pt's preferred method of trying to quit at this time.  An After Visit Summary was printed and given to the patient.   FOLLOW UP: Return for F/u 4-6 mo for AWV (initial).

## 2015-02-20 NOTE — Progress Notes (Signed)
Pre visit review using our clinic review tool, if applicable. No additional management support is needed unless otherwise documented below in the visit note. 

## 2015-02-25 NOTE — Telephone Encounter (Signed)
Pt was seen 02/14/15 at [facility]

## 2015-04-15 ENCOUNTER — Other Ambulatory Visit: Payer: Self-pay | Admitting: *Deleted

## 2015-04-15 MED ORDER — LEVOTHYROXINE SODIUM 100 MCG PO TABS: 100.0000 ug | ORAL_TABLET | Freq: Every day | ORAL | Status: DC

## 2015-04-15 NOTE — Telephone Encounter (Signed)
Pt LMOM on 04/15/15 at 12:37pm requesting refill sent to [facility].   RF request for levothyroxine LOV: 02/20/15 Next ov: None Last written: unknown 02/20/15 TSH 1.05  Rx sent. Pt advised and voiced understanding.

## 2015-08-07 ENCOUNTER — Ambulatory Visit (INDEPENDENT_AMBULATORY_CARE_PROVIDER_SITE_OTHER): Payer: PPO | Admitting: Family Medicine

## 2015-08-07 ENCOUNTER — Encounter: Payer: Self-pay | Admitting: Family Medicine

## 2015-08-07 VITALS — BP 123/76 | HR 96 | Temp 97.8°F | Resp 16 | Ht 72.0 in | Wt 201.8 lb

## 2015-08-07 DIAGNOSIS — H61892 Other specified disorders of left external ear: Secondary | ICD-10-CM

## 2015-08-07 DIAGNOSIS — T162XXA Foreign body in left ear, initial encounter: Secondary | ICD-10-CM | POA: Diagnosis not present

## 2015-08-07 MED ORDER — NEOMYCIN-POLYMYXIN-HC 1 % OT SOLN: 3.0000 [drp] | Freq: Three times a day (TID) | OTIC | Status: DC

## 2015-08-07 NOTE — Progress Notes (Signed)
OFFICE VISIT  08/07/2015   CC:  Chief Complaint  Patient presents with  . Foreign Body in Ear    left ear     HPI:    Patient is a 65 y.o. Caucasian male who presents for: two days ago accidentally broke off his hearing aid in ear canal of left ear.  The ear does not hurt but he says it is uncomfortable.  He has not tried to get the object out himself.  Past Medical History  Diagnosis Date  . Coronary artery disease   . Hyperlipidemia   . Hypothyroidism   . Tobacco abuse   . S/P coronary artery stent placement 2005    RCA and LCX  . Normal nuclear stress test 2012  . Claudication ([place])     Normal ABI's in 2006  . Nephrolithiasis     passed one stone approx 2008; no prob since (saw urologist briefly)  . Prostatitis     When pt in 81s and 20s; saw Dr. Gaynelle Arabian and eventually got a TURP per pt's description.  No probs since then.    Past Surgical History  Procedure Laterality Date  . Coronary stent placement  04/19/2003    STENT PLACEMENT IN THE LEFT CIRCUMFLEX CORONARY AND RIGHT CORONARY ARTERY. MILD RESIDUAL STENOSIS IN THE MID PORTION OF THE LEFT ANTERIOR DESCENDING  . Cardiac catheterization  04/16/2003    NORMAL. EF 60%. TOTALLY OCCLUDED LEFT CIRCUMFLEX WITH SEVERE DISEASE, AND ULCERATED PLAQUE IN THE PROXIMAL RIGHT CORONARY ARTERY AND MILD ATHERSCLEROSIS IN THE LEFT ANTERIOR DESCENDING  . Cardiovascular stress test  2009;2012    2012 normal nuclear stress test  . Appendectomy      Outpatient Prescriptions Prior to Visit  Medication Sig Dispense Refill  . aspirin 81 MG tablet Take 81 mg by mouth daily.      Marland Kitchen atorvastatin (LIPITOR) 80 MG tablet Take 1 tablet (80 mg total) by mouth daily. 30 tablet 11  . cetirizine (ZYRTEC) 10 MG tablet Take 1 tablet (10 mg total) by mouth daily. 30 tablet 11  . fluticasone (FLONASE) 50 MCG/ACT nasal spray Place 2 sprays into both nostrils daily. 16 g 11  . levothyroxine (SYNTHROID, LEVOTHROID) 100 MCG tablet Take 1 tablet (100 mcg  total) by mouth daily. 30 tablet 11  . predniSONE (DELTASONE) 10 MG tablet 3 tabs by mouth per day for 3 days,2tabs per day for 3 days,1tab per day for 3 days (Patient not taking: Reported on 08/07/2015) 18 tablet 0  . zoster vaccine live, PF, (ZOSTAVAX) 09811 UNT/0.65ML injection Inject 19,400 Units into the skin once. (Patient not taking: Reported on 08/07/2015) 1 vial 0   No facility-administered medications prior to visit.    Allergies  Allergen Reactions  . Plavix [Clopidogrel Bisulfate] Rash    ROS As per HPI  PE: Blood pressure 123/76, pulse 96, temperature 97.8 F (36.6 C), temperature source Oral, resp. rate 16, height 6' (1.829 m), weight 201 lb 12 oz (91.513 kg), SpO2 94 %. Gen: Alert, well appearing.  Patient is oriented to person, place, time, and situation. AFFECT: pleasant, lucid thought and speech. Left EAC with greyish foreign body abutting the TM. I was able to remove this foreign body (the rubber tip of his hearing aid) with narrow pick-ups today, pt tolerated this well, no immediate complications., Left EAC appears somewhat erythematous, as does some of the surface of the TM.  LABS:  none  IMPRESSION AND PLAN:  Foreign body successfully removed from left ear canal. Canal  with mildly inflamed appearance. I rx'd polymyxin otic drops, 3 drops in left EAC q8h x 5d.  An After Visit Summary was printed and given to the patient.  FOLLOW UP: Return for follow up as needed.  Signed:  Crissie Sickles, MD           08/07/2015

## 2015-08-07 NOTE — Progress Notes (Signed)
Pre visit review using our clinic review tool, if applicable. No additional management support is needed unless otherwise documented below in the visit note. 

## 2015-08-15 DIAGNOSIS — L570 Actinic keratosis: Secondary | ICD-10-CM | POA: Diagnosis not present

## 2015-08-15 DIAGNOSIS — L821 Other seborrheic keratosis: Secondary | ICD-10-CM | POA: Diagnosis not present

## 2015-09-06 ENCOUNTER — Other Ambulatory Visit: Payer: Self-pay | Admitting: Cardiovascular Disease

## 2015-09-20 DIAGNOSIS — M25562 Pain in left knee: Secondary | ICD-10-CM

## 2015-09-20 DIAGNOSIS — M25561 Pain in right knee: Secondary | ICD-10-CM

## 2015-09-20 HISTORY — DX: Pain in right knee: M25.561

## 2015-09-20 HISTORY — DX: Pain in right knee: M25.562

## 2015-10-09 ENCOUNTER — Other Ambulatory Visit: Payer: Self-pay | Admitting: *Deleted

## 2015-10-09 MED ORDER — ATORVASTATIN CALCIUM 80 MG PO TABS: 80.0000 mg | ORAL_TABLET | Freq: Every day | ORAL | Status: DC

## 2015-10-13 ENCOUNTER — Telehealth: Payer: Self-pay | Admitting: Family Medicine

## 2015-10-13 NOTE — Telephone Encounter (Signed)
Please advise. Thanks.  

## 2015-10-13 NOTE — Telephone Encounter (Signed)
Left message for pt to call back  °

## 2015-10-13 NOTE — Telephone Encounter (Signed)
I can refer him if that's what he prefers, but he can also come in and see me for this first if he wants.  Let me know.

## 2015-10-14 NOTE — Telephone Encounter (Signed)
Patient scheduled an appt

## 2015-10-17 ENCOUNTER — Ambulatory Visit (INDEPENDENT_AMBULATORY_CARE_PROVIDER_SITE_OTHER): Payer: PPO | Admitting: Family Medicine

## 2015-10-17 ENCOUNTER — Encounter: Payer: Self-pay | Admitting: Family Medicine

## 2015-10-17 VITALS — BP 108/74 | HR 73 | Temp 98.1°F | Resp 16 | Ht 72.0 in | Wt 202.0 lb

## 2015-10-17 DIAGNOSIS — M25562 Pain in left knee: Secondary | ICD-10-CM

## 2015-10-17 DIAGNOSIS — M25561 Pain in right knee: Secondary | ICD-10-CM | POA: Diagnosis not present

## 2015-10-17 MED ORDER — MELOXICAM 15 MG PO TABS: 15.0000 mg | ORAL_TABLET | Freq: Every day | ORAL | 1 refills | Status: DC

## 2015-10-17 NOTE — Progress Notes (Signed)
OFFICE VISIT  10/17/2015   CC:  Chief Complaint  Patient presents with  . Knee Pain    bilateral x 3-4 weeks    HPI:    Patient is a 65 y.o. Caucasian male who presents for R>L knee pain. Onset about 2 mo ago, initially mild but more severe the last 3 weeks.  Points to medial and posterior aspect of knee.  Sitting and lying down hurts the worst.  Getting up and walking around helps it.  No knee swelling or erythema.  Left knee same characteristics, mainly posterior area pain.  No buckling, locking up, or limping. Occ turns L knee a certain way and it catches briefly.  Tylenol arthritis helps a little. No hx of trauma or acute strain recalled.  Other joints: mild R wrist pain w/out swelling or redness.  Past Medical History:  Diagnosis Date  . Claudication ([place])    Normal ABI's in 2006  . Coronary artery disease   . Hyperlipidemia   . Hypothyroidism   . Nephrolithiasis    passed one stone approx 2008; no prob since (saw urologist briefly)  . Normal nuclear stress test 2012  . Prostatitis    When pt in 54s and 7s; saw Dr. Gaynelle Arabian and eventually got a TURP per pt's description.  No probs since then.  . S/P coronary artery stent placement 2005   RCA and LCX  . Tobacco abuse     Past Surgical History:  Procedure Laterality Date  . APPENDECTOMY    . CARDIAC CATHETERIZATION  04/16/2003   NORMAL. EF 60%. TOTALLY OCCLUDED LEFT CIRCUMFLEX WITH SEVERE DISEASE, AND ULCERATED PLAQUE IN THE PROXIMAL RIGHT CORONARY ARTERY AND MILD ATHERSCLEROSIS IN THE LEFT ANTERIOR DESCENDING  . CARDIOVASCULAR STRESS TEST  2009;2012   2012 normal nuclear stress test  . CORONARY STENT PLACEMENT  04/19/2003   STENT PLACEMENT IN THE LEFT CIRCUMFLEX CORONARY AND RIGHT CORONARY ARTERY. MILD RESIDUAL STENOSIS IN THE MID PORTION OF THE LEFT ANTERIOR DESCENDING    Outpatient Medications Prior to Visit  Medication Sig Dispense Refill  . aspirin 81 MG tablet Take 81 mg by mouth daily.      Marland Kitchen atorvastatin  (LIPITOR) 80 MG tablet Take 1 tablet (80 mg total) by mouth daily. Please call and schedule an appointment 15 tablet 0  . cetirizine (ZYRTEC) 10 MG tablet Take 1 tablet (10 mg total) by mouth daily. 30 tablet 11  . fluticasone (FLONASE) 50 MCG/ACT nasal spray Place 2 sprays into both nostrils daily. 16 g 11  . levothyroxine (SYNTHROID, LEVOTHROID) 100 MCG tablet Take 1 tablet (100 mcg total) by mouth daily. 30 tablet 11  . NEOMYCIN-POLYMYXIN-HYDROCORTISONE (CORTISPORIN) 1 % SOLN otic solution Place 3 drops into the left ear every 8 (eight) hours. For 5 days (Patient not taking: Reported on 10/17/2015) 10 mL 0   No facility-administered medications prior to visit.     Allergies  Allergen Reactions  . Plavix [Clopidogrel Bisulfate] Rash    ROS As per HPI  PE: Blood pressure 108/74, pulse 73, temperature 98.1 F (36.7 C), temperature source Oral, resp. rate 16, height 6' (1.829 m), weight 202 lb (91.6 kg), SpO2 96 %. Gen: Alert, well appearing.  Patient is oriented to person, place, time, and situation. AFFECT: pleasant, lucid thought and speech. Knees: no swelling, erythema, or warmth.  ROM intact. No instability.  Mild TTP in right knee along medial aspect but more of the bony aspect, not so much the collateral ligament area.  No laxity or  pain with varus/valgus stress.  No joint line tenderness.  No patellar tendon or pes anserine bursa tenderness.   Left knee with no abnormality on exam today at all.  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 Check knee x-rays: L and R. Start meloxicam 15mg  qd, may continue tylenol prn as well. Recheck 47mo, consider steroid injection in R knee.  An After Visit Summary was printed and given to the patient.  FOLLOW UP: Return in about 4 weeks (around 11/14/2015) for f/u knees (30 min visit).  Signed:  Crissie Sickles, MD           10/17/2015

## 2015-10-17 NOTE — Progress Notes (Signed)
Pre visit review using our clinic review tool, if applicable. No additional management support is needed unless otherwise documented below in the visit note. 

## 2015-10-20 ENCOUNTER — Ambulatory Visit (HOSPITAL_BASED_OUTPATIENT_CLINIC_OR_DEPARTMENT_OTHER)
Admission: RE | Admit: 2015-10-20 | Discharge: 2015-10-20 | Disposition: A | Discharge status: Home / Self Care | Payer: PPO | Source: Ambulatory Visit | Attending: Family Medicine | Admitting: Family Medicine

## 2015-10-20 DIAGNOSIS — M25862 Other specified joint disorders, left knee: Secondary | ICD-10-CM | POA: Diagnosis not present

## 2015-10-20 DIAGNOSIS — M25861 Other specified joint disorders, right knee: Secondary | ICD-10-CM | POA: Diagnosis not present

## 2015-10-20 DIAGNOSIS — M25561 Pain in right knee: Secondary | ICD-10-CM | POA: Insufficient documentation

## 2015-10-20 DIAGNOSIS — M25562 Pain in left knee: Secondary | ICD-10-CM | POA: Insufficient documentation

## 2015-10-27 ENCOUNTER — Other Ambulatory Visit: Payer: Self-pay | Admitting: Cardiovascular Disease

## 2015-11-10 ENCOUNTER — Other Ambulatory Visit: Payer: Self-pay | Admitting: Cardiovascular Disease

## 2015-12-01 ENCOUNTER — Ambulatory Visit (INDEPENDENT_AMBULATORY_CARE_PROVIDER_SITE_OTHER): Payer: PPO | Admitting: Family Medicine

## 2015-12-01 ENCOUNTER — Encounter: Payer: Self-pay | Admitting: Family Medicine

## 2015-12-01 VITALS — BP 129/81 | HR 96 | Temp 97.9°F | Resp 16 | Wt 203.0 lb

## 2015-12-01 DIAGNOSIS — J329 Chronic sinusitis, unspecified: Secondary | ICD-10-CM

## 2015-12-01 DIAGNOSIS — J310 Chronic rhinitis: Secondary | ICD-10-CM

## 2015-12-01 MED ORDER — PREDNISONE 20 MG PO TABS: ORAL_TABLET | ORAL | 0 refills | Status: DC

## 2015-12-01 MED ORDER — MONTELUKAST SODIUM 10 MG PO TABS: 10.0000 mg | ORAL_TABLET | Freq: Every day | ORAL | 12 refills | Status: DC

## 2015-12-01 MED ORDER — AMOXICILLIN 875 MG PO TABS: 875.0000 mg | ORAL_TABLET | Freq: Two times a day (BID) | ORAL | 0 refills | Status: AC

## 2015-12-01 NOTE — Progress Notes (Signed)
OFFICE VISIT  12/01/2015   CC:  Chief Complaint  Patient presents with  . Sinusitis    Sinus pressure and nasal drainage   HPI:    Patient is a 65 y.o. Caucasian male who presents for respiratory complaints. Chronic waxing/waning nasal/sinus congestion/pressure.  Bush hogs a lot, seems worse afterwards. Worse for about a week or so.  No cough or fevers.  Forehead pressure in center, minimal maxillary or paranasal sinus pressure.  Doesn't take flonase regularly.  Taking otc allergy pill but only the last few days. Has never been allergy tested.  Has no pets at home. Denies wheezing or SOB.   Past Medical History:  Diagnosis Date  . Arthralgia of both knees 09/2015   Plain films with minimal DJD changes medial compartment, o/w nl.  . Claudication ([place])    Normal ABI's in 2006  . Coronary artery disease   . Hyperlipidemia   . Hypothyroidism   . Nephrolithiasis    passed one stone approx 2008; no prob since (saw urologist briefly)  . Normal nuclear stress test 2012  . Prostatitis    When pt in 69s and 28s; saw Dr. Gaynelle Arabian and eventually got a TURP per pt's description.  No probs since then.  . Recurrent sinusitis    Sinus plain films 08/2014: bilat maxillary sinusitis (chronic)  . S/P coronary artery stent placement 2005   RCA and LCX  . Tobacco abuse     Past Surgical History:  Procedure Laterality Date  . APPENDECTOMY    . CARDIAC CATHETERIZATION  04/16/2003   NORMAL. EF 60%. TOTALLY OCCLUDED LEFT CIRCUMFLEX WITH SEVERE DISEASE, AND ULCERATED PLAQUE IN THE PROXIMAL RIGHT CORONARY ARTERY AND MILD ATHERSCLEROSIS IN THE LEFT ANTERIOR DESCENDING  . CARDIOVASCULAR STRESS TEST  2009;2012   2012 normal nuclear stress test  . CORONARY STENT PLACEMENT  04/19/2003   STENT PLACEMENT IN THE LEFT CIRCUMFLEX CORONARY AND RIGHT CORONARY ARTERY. MILD RESIDUAL STENOSIS IN THE MID PORTION OF THE LEFT ANTERIOR DESCENDING    Outpatient Medications Prior to Visit  Medication Sig Dispense  Refill  . aspirin 81 MG tablet Take 81 mg by mouth daily.      Marland Kitchen atorvastatin (LIPITOR) 80 MG tablet Take 1 tablet (80 mg total) by mouth daily at 6 PM. 30 tablet 1  . fluticasone (FLONASE) 50 MCG/ACT nasal spray Place 2 sprays into both nostrils daily. 16 g 11  . levothyroxine (SYNTHROID, LEVOTHROID) 100 MCG tablet Take 1 tablet (100 mcg total) by mouth daily. 30 tablet 11  . meloxicam (MOBIC) 15 MG tablet Take 1 tablet (15 mg total) by mouth daily. 30 tablet 1  . cetirizine (ZYRTEC) 10 MG tablet Take 1 tablet (10 mg total) by mouth daily. (Patient not taking: Reported on 12/01/2015) 30 tablet 11   No facility-administered medications prior to visit.     Allergies  Allergen Reactions  . Plavix [Clopidogrel Bisulfate] Rash    ROS As per HPI  PE: Blood pressure 129/81, pulse 96, temperature 97.9 F (36.6 C), temperature source Oral, resp. rate 16, weight 203 lb (92.1 kg), SpO2 97 %. VS: noted--normal. Gen: alert, NAD, NONTOXIC APPEARING. HEENT: eyes without injection, drainage, or swelling.  Ears: EACs clear, TMs with normal light reflex and landmarks.  Nose: Purulent rhinorrhea in R nostril, with some dried, crusty exudate adherent to mildly injected and edematous mucosa.   No paranasal sinus TTP.  No facial swelling.  Throat and mouth without focal lesion.  No pharyngial swelling, erythema, or exudate.  Neck: supple, no LAD.   LUNGS: CTA bilat, nonlabored resps.   CV: RRR, no m/r/g. EXT: no c/c/e SKIN: no rash   LABS:    Chemistry      Component Value Date/Time   NA 138 09/12/2014 0939   K 4.0 09/12/2014 0939   CL 105 09/12/2014 0939   CO2 27 09/12/2014 0939   BUN 14 09/12/2014 0939   CREATININE 1.05 09/12/2014 0939      Component Value Date/Time   CALCIUM 9.5 09/12/2014 0939   ALKPHOS 100 12/10/2014 0818   AST 19 12/10/2014 0818   ALT 22 12/10/2014 0818   BILITOT 1.1 12/10/2014 0818     Lab Results  Component Value Date   TSH 1.05 02/20/2015   Lab Results   Component Value Date   CHOL 121 12/10/2014   HDL 39.60 12/10/2014   LDLCALC 64 12/10/2014   TRIG 86.0 12/10/2014   CHOLHDL 3 12/10/2014   IMPRESSION AND PLAN:  1) Chronic allergic rhinitis; pt needs to get on daily med regimen and adhere to it. Encouraged him to take his flonase EVERY DAY.  Start saline nasal spray 2 times per day. Start otc generic allegra 180 mg qd. Start singulair 10mg  qd.  2) Acute sinusitis: infectious + allergic. Amoxil 875mg  bid x 14 days.  He expresses that past results with this med have been favorable in cases like this. Prednisone 40mg  qd x 4d, then 20mg  qd x 4d, then 10mg  qd x 4d.  An After Visit Summary was printed and given to the patient.  FOLLOW UP: Return in about 3 months (around 03/01/2016) for annual CPE (fasting).  Signed:  Crissie Sickles, MD           12/01/2015

## 2015-12-01 NOTE — Patient Instructions (Signed)
Take otc generic allegra 180 mg once daily.  Use otc generic saline nasal spray 2-3 times per day to irrigate/moisturize your nasal passages.

## 2015-12-22 ENCOUNTER — Telehealth: Payer: Self-pay | Admitting: Family Medicine

## 2015-12-22 NOTE — Telephone Encounter (Signed)
(  voicemail message retrived from phone at front desk)  Patient calling to inquire if he has received his flu vaccine this year.  He thinks he received a pneumonia vaccine but wasn't sure if he has had the flu vaccine.  Please check and follow up with patient.

## 2015-12-23 NOTE — Telephone Encounter (Signed)
Patient has had pneumonia vaccine but will need flu vaccine this year.

## 2015-12-23 NOTE — Telephone Encounter (Signed)
Patient notified and verbalized understanding that he would need flu vaccine this year.

## 2016-01-14 NOTE — Progress Notes (Signed)
Chief Complaint  Patient presents with  . [place] OV     History of Present Illness: 65 yo WM with history of CAD, HLD, tobacco abuse, hypothyroidism here today for cardiac follow up. He has been followed in the past by Dr. Doreatha Lew. He had a NSTEMI in January 2005 and had a Cypher DES (3.0 x 45mm) placed in the occluded Circumflex and a Cypher DES (3.0 x 18 mm) placed in the RCA. He has had no further caths since then. Stress myoview in July 2012 showed no ischemia. Exercise stress test August 2016 with no ischemia.    He is here today for follow up. He is doing well. No chest pain. He continues to smoke 1 ppd. He is staying active. He does yard work.  He is tolerating his medicines.   Primary Care Physician: Tammi Sou, MD   Past Medical History:  Diagnosis Date  . Arthralgia of both knees 09/2015   Plain films with minimal DJD changes medial compartment, o/w nl.  . Claudication ([place])    Normal ABI's in 2006  . Coronary artery disease   . Hyperlipidemia   . Hypothyroidism   . Nephrolithiasis    passed one stone approx 2008; no prob since (saw urologist briefly)  . Normal nuclear stress test 2012  . Prostatitis    When pt in 16s and 74s; saw Dr. Gaynelle Arabian and eventually got a TURP per pt's description.  No probs since then.  . Recurrent sinusitis    Sinus plain films 08/2014: bilat maxillary sinusitis (chronic)  . S/P coronary artery stent placement 2005   RCA and LCX  . Tobacco abuse     Past Surgical History:  Procedure Laterality Date  . APPENDECTOMY    . CARDIAC CATHETERIZATION  04/16/2003   NORMAL. EF 60%. TOTALLY OCCLUDED LEFT CIRCUMFLEX WITH SEVERE DISEASE, AND ULCERATED PLAQUE IN THE PROXIMAL RIGHT CORONARY ARTERY AND MILD ATHERSCLEROSIS IN THE LEFT ANTERIOR DESCENDING  . CARDIOVASCULAR STRESS TEST  2009;2012   2012 normal nuclear stress test  . CORONARY STENT PLACEMENT  04/19/2003   STENT PLACEMENT IN THE LEFT CIRCUMFLEX CORONARY AND RIGHT CORONARY ARTERY.  MILD RESIDUAL STENOSIS IN THE MID PORTION OF THE LEFT ANTERIOR DESCENDING    Current Outpatient Prescriptions  Medication Sig Dispense Refill  . aspirin 81 MG tablet Take 81 mg by mouth daily.      Marland Kitchen atorvastatin (LIPITOR) 80 MG tablet Take 1 tablet (80 mg total) by mouth daily at 6 PM. 30 tablet 1  . Cyanocobalamin (VITAMIN B-12 PO) Take 1 tablet by mouth daily.    . fexofenadine (ALLEGRA) 180 MG tablet Take 180 mg by mouth daily.    . fluticasone (FLONASE) 50 MCG/ACT nasal spray Place 2 sprays into both nostrils daily. 16 g 11  . levothyroxine (SYNTHROID, LEVOTHROID) 100 MCG tablet Take 1 tablet (100 mcg total) by mouth daily. 30 tablet 11  . montelukast (SINGULAIR) 10 MG tablet Take 1 tablet (10 mg total) by mouth at bedtime. 30 tablet 12   No current facility-administered medications for this visit.     Allergies  Allergen Reactions  . Plavix [Clopidogrel Bisulfate] Rash    Social History   Social History  . Marital status: Married    Spouse name: N/A  . Number of children: 2  . Years of education: N/A   Occupational History  . Sales Rep    Social History Main Topics  . Smoking status: Current Every Day Smoker    Packs/day: 1.00  Years: 30.00  . Smokeless tobacco: Never Used  . Alcohol use No  . Drug use: No  . Sexual activity: Yes   Other Topics Concern  . Not on file   Social History Narrative   Married, 1 son in [place] and one in [place].   Lives in [place].   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   Tob: 40 pack-yr hx, current as of 04/2014.   Alcohol: social/rare.    Family History  Problem Relation Age of Onset  . Hypertension Mother   . Stroke Mother   . Hypertension Father   . Stroke Father   . Heart attack Brother     STENT    Review of Systems:  As stated in the HPI and otherwise negative.   BP 130/76   Pulse 84   Ht 6' (1.829 m)   Wt 200 lb 6.4 oz (90.9 kg)   SpO2 95%    BMI 27.18 kg/m   Physical Examination: General: Well developed, well nourished, NAD  HEENT: OP clear, mucus membranes moist  SKIN: warm, dry. No rashes. Neuro: No focal deficits  Musculoskeletal: Muscle strength 5/5 all ext  Psychiatric: Mood and affect normal  Neck: No JVD, no carotid bruits, no thyromegaly, no lymphadenopathy.  Lungs:Clear bilaterally, no wheezes, rhonci, crackles Cardiovascular: Regular rate and rhythm. No murmurs, gallops or rubs. Abdomen:Soft. Bowel sounds present. Non-tender.  Extremities: No lower extremity edema. Pulses are 2 + in the bilateral DP/PT.  EKG:  EKG is ordered today. The ekg ordered today demonstrates NSR, rate 87 bpm.   Recent Labs: 02/20/2015: TSH 1.05   Lipid Panel    Component Value Date/Time   CHOL 121 12/10/2014 0818   TRIG 86.0 12/10/2014 0818   HDL 39.60 12/10/2014 0818   CHOLHDL 3 12/10/2014 0818   VLDL 17.2 12/10/2014 0818   LDLCALC 64 12/10/2014 0818     Wt Readings from Last 3 Encounters:  01/15/16 200 lb 6.4 oz (90.9 kg)  12/01/15 203 lb (92.1 kg)  10/17/15 202 lb (91.6 kg)     Other studies Reviewed: Additional studies/ records that were reviewed today include: . Review of the above records demonstrates:    Assessment and Plan:   1. CAD: Stable.  Exercise stress test August 2016 with no ischemia. Will continue ASA and statin. Will start metoprolol 25 mg po BID.   2. Tobacco abuse: Smoking cessation encouraged. We have spent 10 minutes today reviewing a plan to stop smoking. He wants to stop smoking. We discussed Chantix and nicotine patches/gum. He will try the gum and will cut back on his cigarettest.    3. Hyperlipidemia: Lipids controlled. Continue statin. Check LFTs and lipids.   Current medicines are reviewed at length with the patient today.  The patient does not have concerns regarding medicines.  The following changes have been made:  no change  Labs/ tests ordered today include:   No orders of the  defined types were placed in this encounter.   Disposition:   FU with me in 12 months  Signed, Lauree Chandler, MD 01/15/2016 8:59 AM    [address], [place], [place]  [address] Phone: [phone]; Fax: [phone]

## 2016-01-15 ENCOUNTER — Encounter: Payer: Self-pay | Admitting: Cardiovascular Disease

## 2016-01-15 ENCOUNTER — Ambulatory Visit (INDEPENDENT_AMBULATORY_CARE_PROVIDER_SITE_OTHER): Payer: PPO | Admitting: Cardiovascular Disease

## 2016-01-15 ENCOUNTER — Encounter (INDEPENDENT_AMBULATORY_CARE_PROVIDER_SITE_OTHER): Payer: Self-pay

## 2016-01-15 VITALS — BP 130/76 | HR 84 | Ht 72.0 in | Wt 200.4 lb

## 2016-01-15 DIAGNOSIS — I2510 Atherosclerotic heart disease of native coronary artery without angina pectoris: Secondary | ICD-10-CM | POA: Diagnosis not present

## 2016-01-15 DIAGNOSIS — Z720 Tobacco use: Secondary | ICD-10-CM

## 2016-01-15 DIAGNOSIS — E7800 Pure hypercholesterolemia, unspecified: Secondary | ICD-10-CM

## 2016-01-15 MED ORDER — ATORVASTATIN CALCIUM 80 MG PO TABS: 80.0000 mg | ORAL_TABLET | Freq: Every day | ORAL | 3 refills | Status: DC

## 2016-01-15 MED ORDER — METOPROLOL TARTRATE 25 MG PO TABS: 25.0000 mg | ORAL_TABLET | Freq: Two times a day (BID) | ORAL | 11 refills | Status: DC

## 2016-01-15 NOTE — Patient Instructions (Signed)
Medication Instructions:  Your physician has recommended you make the following change in your medication:  Start metoprolol tartrate 25 mg by mouth twice daily.    Labwork: Your physician recommends that you return for lab work tomorrow.  This will be fasting. The lab opens at 7:30 AM   Testing/Procedures: none  Follow-Up: Your physician wants you to follow-up in: 12 months.  You will receive a reminder letter in the mail two months in advance. If you don't receive a letter, please call our office to schedule the follow-up appointment.   Any Other Special Instructions Will Be Listed Below (If Applicable).     If you need a refill on your cardiac medications before your next appointment, please call your pharmacy.

## 2016-01-16 ENCOUNTER — Other Ambulatory Visit: Payer: PPO | Admitting: *Deleted

## 2016-01-16 ENCOUNTER — Telehealth: Payer: Self-pay | Admitting: Cardiovascular Disease

## 2016-01-16 DIAGNOSIS — E7800 Pure hypercholesterolemia, unspecified: Secondary | ICD-10-CM | POA: Diagnosis not present

## 2016-01-16 LAB — LIPID PANEL
CHOLESTEROL: 153 mg/dL (ref 125–200)
HDL: 41 mg/dL (ref >=40)
LDL CALC: 87 mg/dL (ref <130)
TRIGLYCERIDES: 125 mg/dL (ref <150)
Total CHOL/HDL Ratio: 3.7 Ratio (ref <=5.0)
VLDL: 25 mg/dL (ref <30)

## 2016-01-16 LAB — HEPATIC FUNCTION PANEL
ALBUMIN: 4.3 g/dL (ref 3.6–5.1)
ALT: 23 U/L (ref 9–46)
AST: 16 U/L (ref 10–35)
Alkaline Phosphatase: 112 U/L (ref 40–115)
BILIRUBIN TOTAL: 0.8 mg/dL (ref 0.2–1.2)
Bilirubin, Direct: 0.1 mg/dL (ref <=0.2)
Indirect Bilirubin: 0.7 mg/dL (ref 0.2–1.2)
Total Protein: 6.9 g/dL (ref 6.1–8.1)

## 2016-01-16 NOTE — Telephone Encounter (Signed)
°  New Prob   Pt has some questions regarding his Metoprolol Tartrate medication and why he is on it. Please call.

## 2016-01-16 NOTE — Telephone Encounter (Signed)
Walk In Pt Form-Pt Needs to Speak With Nurse-please call gave to [facility]

## 2016-01-16 NOTE — Telephone Encounter (Signed)
I spoke with pt and explained to him why he was on metoprolol.  Pt reports Dr. Angelena Form said if he developed fatigue it may need to be stopped.  I asked pt to let us know before stopping medication.

## 2016-02-20 DIAGNOSIS — R7303 Prediabetes: Secondary | ICD-10-CM

## 2016-02-20 HISTORY — DX: Prediabetes: R73.03

## 2016-03-03 ENCOUNTER — Telehealth: Payer: Self-pay | Admitting: *Deleted

## 2016-03-03 MED ORDER — LEVOTHYROXINE SODIUM 100 MCG PO TABS: 100.0000 ug | ORAL_TABLET | Freq: Every day | ORAL | 0 refills | Status: DC

## 2016-03-03 NOTE — Telephone Encounter (Signed)
When I made the patient's appointment he asked about Montelukast Rx. He wants to know if he needs to keep taking it.

## 2016-03-03 NOTE — Telephone Encounter (Signed)
SW pt and advised him to continue medication until he is seen by Dr. Anitra Lauth. This can be discussed at his visit with Dr. Anitra Lauth.

## 2016-03-03 NOTE — Telephone Encounter (Signed)
Fax from [facility] OR requesting refill for levothyroxine   LOV: 02/20/15 NOV: None Last written: 04/15/15 #30 w/ 11RF  Rx sent for #30 w/ 0RF. Pt is due for f/u RCI, needs office visit for more refills. Left message on home vm for pt to call back.

## 2016-03-18 ENCOUNTER — Ambulatory Visit (INDEPENDENT_AMBULATORY_CARE_PROVIDER_SITE_OTHER): Payer: PPO | Admitting: Family Medicine

## 2016-03-18 ENCOUNTER — Encounter: Payer: Self-pay | Admitting: Gastroenterology

## 2016-03-18 ENCOUNTER — Encounter: Payer: Self-pay | Admitting: Family Medicine

## 2016-03-18 VITALS — BP 109/72 | HR 65 | Temp 97.7°F | Resp 16 | Ht 70.5 in | Wt 206.0 lb

## 2016-03-18 DIAGNOSIS — Z1211 Encounter for screening for malignant neoplasm of colon: Secondary | ICD-10-CM

## 2016-03-18 DIAGNOSIS — Z23 Encounter for immunization: Secondary | ICD-10-CM

## 2016-03-18 DIAGNOSIS — I10 Essential (primary) hypertension: Secondary | ICD-10-CM

## 2016-03-18 DIAGNOSIS — Z125 Encounter for screening for malignant neoplasm of prostate: Secondary | ICD-10-CM

## 2016-03-18 DIAGNOSIS — E039 Hypothyroidism, unspecified: Secondary | ICD-10-CM | POA: Diagnosis not present

## 2016-03-18 DIAGNOSIS — Z1159 Encounter for screening for other viral diseases: Secondary | ICD-10-CM | POA: Diagnosis not present

## 2016-03-18 LAB — BASIC METABOLIC PANEL
BUN: 15 mg/dL (ref 6–23)
CALCIUM: 9.6 mg/dL (ref 8.4–10.5)
CO2: 30 meq/L (ref 19–32)
CREATININE: 1.04 mg/dL (ref 0.40–1.50)
Chloride: 104 mEq/L (ref 96–112)
GFR: 75.7 mL/min (ref >60.00)
GLUCOSE: 108 mg/dL — AB (ref 70–99)
Potassium: 4.2 mEq/L (ref 3.5–5.1)
SODIUM: 140 meq/L (ref 135–145)

## 2016-03-18 LAB — PSA, MEDICARE: PSA: 1.34 ng/ml (ref 0.10–4.00)

## 2016-03-18 LAB — TSH: TSH: 5.97 u[IU]/mL — ABNORMAL HIGH (ref 0.35–4.50)

## 2016-03-18 NOTE — Addendum Note (Signed)
Addended by: Onalee Hua on: 03/18/2016 09:36 AM   Modules accepted: Orders

## 2016-03-18 NOTE — Progress Notes (Signed)
Pre visit review using our clinic review tool, if applicable. No additional management support is needed unless otherwise documented below in the visit note. 

## 2016-03-18 NOTE — Progress Notes (Signed)
Office Note 03/18/2016  CC:  Chief Complaint  Patient presents with  . Annual Exam    Pt is fasting.     HPI:  Lawrence Hunter is a 65 y.o. male who is here for annual health maintenance exam.  No acute complaints. Cutting back on smoking, says his new year's resolution is to completely quit.   Past Medical History:  Diagnosis Date  . Arthralgia of both knees 09/2015   Plain films with minimal DJD changes medial compartment, o/w nl.  . Claudication ([place])    Normal ABI's in 2006  . Coronary artery disease   . Hyperlipidemia   . Hypothyroidism   . Nephrolithiasis    passed one stone approx 2008; no prob since (saw urologist briefly)  . Normal nuclear stress test 2012  . Prostatitis    When pt in 97s and 9s; saw Dr. Gaynelle Arabian and eventually got a TURP per pt's description.  No probs since then.  . Recurrent sinusitis    Sinus plain films 08/2014: bilat maxillary sinusitis (chronic)  . S/P coronary artery stent placement 2005   RCA and LCX  . Tobacco abuse     Past Surgical History:  Procedure Laterality Date  . APPENDECTOMY    . CARDIAC CATHETERIZATION  04/16/2003   NORMAL. EF 60%. TOTALLY OCCLUDED LEFT CIRCUMFLEX WITH SEVERE DISEASE, AND ULCERATED PLAQUE IN THE PROXIMAL RIGHT CORONARY ARTERY AND MILD ATHERSCLEROSIS IN THE LEFT ANTERIOR DESCENDING  . CARDIOVASCULAR STRESS TEST  2009;2012; 10/2014   2012 normal nuclear stress test.  2016 ETT no ischemia.  . CORONARY STENT PLACEMENT  04/19/2003   STENT PLACEMENT IN THE LEFT CIRCUMFLEX CORONARY AND RIGHT CORONARY ARTERY. MILD RESIDUAL STENOSIS IN THE MID PORTION OF THE LEFT ANTERIOR DESCENDING    Family History  Problem Relation Age of Onset  . Hypertension Mother   . Stroke Mother   . Hypertension Father   . Stroke Father   . Heart attack Brother     STENT    Social History   Social History  . Marital status: Married    Spouse name: N/A  . Number of children: 2  . Years of education: N/A    Occupational History  . Sales Rep    Social History Main Topics  . Smoking status: Current Every Day Smoker    Packs/day: 1.00    Years: 30.00  . Smokeless tobacco: Never Used  . Alcohol use No  . Drug use: No  . Sexual activity: Yes   Other Topics Concern  . Not on file   Social History Narrative   Married, 1 son in [place] and one in [place].   Lives in [place].   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   Tob: 40 pack-yr hx, current as of 04/2014.   Alcohol: social/rare.    Outpatient Medications Prior to Visit  Medication Sig Dispense Refill  . aspirin 81 MG tablet Take 81 mg by mouth daily.      Marland Kitchen atorvastatin (LIPITOR) 80 MG tablet Take 1 tablet (80 mg total) by mouth daily. 90 tablet 3  . Cyanocobalamin (VITAMIN B-12 PO) Take 1 tablet by mouth daily.    . fluticasone (FLONASE) 50 MCG/ACT nasal spray Place 2 sprays into both nostrils daily. 16 g 11  . levothyroxine (SYNTHROID, LEVOTHROID) 100 MCG tablet Take 1 tablet (100 mcg total) by mouth daily. 30 tablet 0  . metoprolol tartrate (LOPRESSOR) 25 MG tablet Take  1 tablet (25 mg total) by mouth 2 (two) times daily. 60 tablet 11  . montelukast (SINGULAIR) 10 MG tablet Take 1 tablet (10 mg total) by mouth at bedtime. 30 tablet 12  . fexofenadine (ALLEGRA) 180 MG tablet Take 180 mg by mouth daily.     No facility-administered medications prior to visit.     Allergies  Allergen Reactions  . Plavix [Clopidogrel Bisulfate] Rash    ROS Review of Systems  Constitutional: Negative for appetite change, chills, fatigue and fever.  HENT: Negative for congestion, dental problem, ear pain and sore throat.   Eyes: Negative for discharge, redness and visual disturbance.  Respiratory: Negative for cough, chest tightness, shortness of breath and wheezing.   Cardiovascular: Negative for chest pain, palpitations and leg swelling.  Gastrointestinal: Negative for  abdominal pain, blood in stool, diarrhea, nausea and vomiting.  Genitourinary: Negative for difficulty urinating, dysuria, flank pain, frequency, hematuria and urgency.  Musculoskeletal: Negative for arthralgias, back pain, joint swelling, myalgias and neck stiffness.  Skin: Negative for pallor and rash.  Neurological: Negative for dizziness, speech difficulty, weakness and headaches.  Hematological: Negative for adenopathy. Does not bruise/bleed easily.  Psychiatric/Behavioral: Negative for confusion and sleep disturbance. The patient is not nervous/anxious.     PE; Blood pressure 109/72, pulse 65, temperature 97.7 F (36.5 C), temperature source Oral, resp. rate 16, height 5' 10.5" (1.791 m), weight 206 lb (93.4 kg), SpO2 97 %. Gen: Alert, well appearing.  Patient is oriented to person, place, time, and situation. AFFECT: pleasant, lucid thought and speech. ENT: Ears: EACs clear, normal epithelium.  TMs with good light reflex and landmarks bilaterally.  Eyes: no injection, icteris, swelling, or exudate.  EOMI, PERRLA. Nose: no drainage or turbinate edema/swelling.  No injection or focal lesion.  Mouth: lips without lesion/swelling.  Oral mucosa pink and moist.  Dentition intact and without obvious caries or gingival swelling.  Oropharynx without erythema, exudate, or swelling.  Neck: supple/nontender.  No LAD, mass, or TM.  Carotid pulses 2+ bilaterally, without bruits. CV: RRR, no m/r/g.   LUNGS: CTA bilat, nonlabored resps, good aeration in all lung fields. ABD: soft, NT, ND, BS normal.  No hepatospenomegaly or mass.  No bruits. EXT: no clubbing, cyanosis, or edema.  Musculoskeletal: no joint swelling, erythema, warmth, or tenderness.  ROM of all joints intact. Skin - no sores or suspicious lesions or rashes or color changes Rectal exam: negative without mass, lesions or tenderness, PROSTATE EXAM: smooth and symmetric without nodules or tenderness.   Pertinent labs:  Lab Results   Component Value Date   TSH 1.05 02/20/2015    Lab Results  Component Value Date   CREATININE 1.05 09/12/2014   BUN 14 09/12/2014   NA 138 09/12/2014   K 4.0 09/12/2014   CL 105 09/12/2014   CO2 27 09/12/2014   Lab Results  Component Value Date   ALT 23 01/16/2016   AST 16 01/16/2016   ALKPHOS 112 01/16/2016   BILITOT 0.8 01/16/2016   Lab Results  Component Value Date   CHOL 153 01/16/2016   Lab Results  Component Value Date   HDL 41 01/16/2016   Lab Results  Component Value Date   LDLCALC 87 01/16/2016   Lab Results  Component Value Date   TRIG 125 01/16/2016   Lab Results  Component Value Date   CHOLHDL 3.7 01/16/2016    ASSESSMENT AND PLAN:   Health maintenance exam: Reviewed age and gender appropriate health maintenance issues (prudent diet, regular  exercise, health risks of tobacco and excessive alcohol, use of seatbelts, fire alarms in home, use of sunscreen).  Also reviewed age and gender appropriate health screening as well as vaccine recommendations. Pneumovax 23 given today.  Gave pt rx for Zostavax. Hep C screening done today per pt's request. BMET and TSH drawn. Prostate cancer screening: DRE normal today, PSA drawn. Colon cancer screening: referred pt to [facility] for this today.  An After Visit Summary was printed and given to the patient.  FOLLOW UP:  Return in about 6 months (around 09/16/2016) for routine chronic illness f/u.  Signed:  Crissie Sickles, MD           03/18/2016

## 2016-03-19 ENCOUNTER — Other Ambulatory Visit (INDEPENDENT_AMBULATORY_CARE_PROVIDER_SITE_OTHER): Payer: PPO

## 2016-03-19 DIAGNOSIS — R946 Abnormal results of thyroid function studies: Secondary | ICD-10-CM

## 2016-03-19 DIAGNOSIS — R7301 Impaired fasting glucose: Secondary | ICD-10-CM | POA: Diagnosis not present

## 2016-03-19 LAB — HEPATITIS C ANTIBODY: HCV Ab: NEGATIVE

## 2016-03-19 LAB — HEMOGLOBIN A1C: Hgb A1c MFr Bld: 6.1 % (ref 4.6–6.5)

## 2016-03-19 LAB — T4, FREE: FREE T4: 0.85 ng/dL (ref 0.60–1.60)

## 2016-03-20 LAB — T3: T3 TOTAL: 89 ng/dL (ref 76–181)

## 2016-03-22 ENCOUNTER — Encounter: Payer: Self-pay | Admitting: Family Medicine

## 2016-03-23 ENCOUNTER — Other Ambulatory Visit: Payer: Self-pay | Admitting: Family Medicine

## 2016-03-23 MED ORDER — LEVOTHYROXINE SODIUM 112 MCG PO TABS: 112.0000 ug | ORAL_TABLET | Freq: Every day | ORAL | 2 refills | Status: DC

## 2016-03-30 ENCOUNTER — Other Ambulatory Visit: Payer: Self-pay | Admitting: Family Medicine

## 2016-04-01 ENCOUNTER — Other Ambulatory Visit: Payer: Self-pay

## 2016-04-01 DIAGNOSIS — E039 Hypothyroidism, unspecified: Secondary | ICD-10-CM

## 2016-04-01 NOTE — Progress Notes (Signed)
TSH lab order placed for 6 - 8 weeks.

## 2016-05-05 ENCOUNTER — Ambulatory Visit (AMBULATORY_SURGERY_CENTER): Payer: Self-pay

## 2016-05-05 VITALS — Ht 71.5 in | Wt 205.4 lb

## 2016-05-05 DIAGNOSIS — Z1211 Encounter for screening for malignant neoplasm of colon: Secondary | ICD-10-CM

## 2016-05-05 MED ORDER — SUPREP BOWEL PREP KIT 17.5-3.13-1.6 GM/177ML PO SOLN: 1.0000 | Freq: Once | ORAL | 0 refills | Status: AC

## 2016-05-05 NOTE — Progress Notes (Signed)
No allergies to eggs or soy No home oxygen No past problems with anesthesia No diet meds  Registered emmi 

## 2016-05-07 ENCOUNTER — Telehealth: Payer: Self-pay | Admitting: Family Medicine

## 2016-05-07 NOTE — Telephone Encounter (Signed)
Spoke with Mr. Lawrence Hunter regarding annual wellness. Patient stated that he will schedule his AWV when he comes to the office for his labs on May 27, 2016.

## 2016-05-19 ENCOUNTER — Ambulatory Visit (AMBULATORY_SURGERY_CENTER): Payer: PPO | Admitting: Gastroenterology

## 2016-05-19 ENCOUNTER — Encounter: Payer: Self-pay | Admitting: Gastroenterology

## 2016-05-19 VITALS — BP 108/67 | HR 53 | Temp 97.8°F | Resp 16 | Ht 71.0 in | Wt 205.0 lb

## 2016-05-19 DIAGNOSIS — Z1211 Encounter for screening for malignant neoplasm of colon: Secondary | ICD-10-CM | POA: Diagnosis not present

## 2016-05-19 DIAGNOSIS — D124 Benign neoplasm of descending colon: Secondary | ICD-10-CM | POA: Diagnosis not present

## 2016-05-19 DIAGNOSIS — Z860101 Personal history of adenomatous and serrated colon polyps: Secondary | ICD-10-CM

## 2016-05-19 DIAGNOSIS — Z86010 Personal history of colonic polyps: Secondary | ICD-10-CM

## 2016-05-19 DIAGNOSIS — I2510 Atherosclerotic heart disease of native coronary artery without angina pectoris: Secondary | ICD-10-CM | POA: Diagnosis not present

## 2016-05-19 DIAGNOSIS — D127 Benign neoplasm of rectosigmoid junction: Secondary | ICD-10-CM | POA: Diagnosis not present

## 2016-05-19 DIAGNOSIS — D123 Benign neoplasm of transverse colon: Secondary | ICD-10-CM | POA: Diagnosis not present

## 2016-05-19 DIAGNOSIS — Z1212 Encounter for screening for malignant neoplasm of rectum: Secondary | ICD-10-CM

## 2016-05-19 DIAGNOSIS — D125 Benign neoplasm of sigmoid colon: Secondary | ICD-10-CM | POA: Diagnosis not present

## 2016-05-19 HISTORY — PX: COLONOSCOPY W/ POLYPECTOMY: SHX1380

## 2016-05-19 HISTORY — DX: Personal history of adenomatous and serrated colon polyps: Z86.0101

## 2016-05-19 HISTORY — DX: Personal history of colonic polyps: Z86.010

## 2016-05-19 MED ORDER — SODIUM CHLORIDE 0.9 % IV SOLN: 500.0000 mL | INTRAVENOUS | Status: DC

## 2016-05-19 NOTE — Patient Instructions (Signed)
YOU HAD AN ENDOSCOPIC PROCEDURE TODAY AT [facility]:   Refer to the procedure report that was given to you for any specific questions about what was found during the examination.  If the procedure report does not answer your questions, please call your gastroenterologist to clarify.  If you requested that your care partner not be given the details of your procedure findings, then the procedure report has been included in a sealed envelope for you to review at your convenience later.  YOU SHOULD EXPECT: Some feelings of bloating in the abdomen. Passage of more gas than usual.  Walking can help get rid of the air that was put into your GI tract during the procedure and reduce the bloating. If you had a lower endoscopy (such as a colonoscopy or flexible sigmoidoscopy) you may notice spotting of blood in your stool or on the toilet paper. If you underwent a bowel prep for your procedure, you may not have a normal bowel movement for a few days.  Please Note:  You might notice some irritation and congestion in your nose or some drainage.  This is from the oxygen used during your procedure.  There is no need for concern and it should clear up in a day or so.  SYMPTOMS TO REPORT IMMEDIATELY:   Following lower endoscopy (colonoscopy or flexible sigmoidoscopy):  Excessive amounts of blood in the stool  Significant tenderness or worsening of abdominal pains  Swelling of the abdomen that is new, acute  Fever of 100F or higher   For urgent or emergent issues, a gastroenterologist can be reached at any hour by calling [phone].   DIET:  We do recommend a small meal at first, but then you may proceed to your regular diet.  Drink plenty of fluids but you should avoid alcoholic beverages for 24 hours.  ACTIVITY:  You should plan to take it easy for the rest of today and you should NOT DRIVE or use heavy machinery until tomorrow (because of the sedation medicines used during the test).     FOLLOW UP: Our staff will call the number listed on your records the next business day following your procedure to check on you and address any questions or concerns that you may have regarding the information given to you following your procedure. If we do not reach you, we will leave a message.  However, if you are feeling well and you are not experiencing any problems, there is no need to return our call.  We will assume that you have returned to your regular daily activities without incident.  If any biopsies were taken you will be contacted by phone or by letter within the next 1-3 weeks.  Please call us at [phone] if you have not heard about the biopsies in 3 weeks.   Await for biopsy results to determined next repeat Colonoscopy screening Polyps (handout given) No Ibuprofen, Naproxen, or other non-steriodal anti-inflammatory drugs for 2 weeks after polyps removal Hemorrhoids (handout given)    SIGNATURES/CONFIDENTIALITY: You and/or your care partner have signed paperwork which will be entered into your electronic medical record.  These signatures attest to the fact that that the information above on your After Visit Summary has been reviewed and is understood.  Full responsibility of the confidentiality of this discharge information lies with you and/or your care-partner.

## 2016-05-19 NOTE — Progress Notes (Signed)
  [facility] Post-op Note  Patient: Lawrence Hunter  Procedure(s) Performed: colonoscopy  Patient Location: [facility] - Recovery Area  Anesthesia Type: Deep Sedation/Propofol  Level of Consciousness: awake, oriented and patient cooperative  Airway and Oxygen Therapy: Patient Spontanous Breathing  Post-op Pain: none  Post-op Assessment:  Post-op Vital signs reviewed, Patient's Cardiovascular Status Stable, Respiratory Function Stable, Patent Airway, No signs of Nausea or vomiting and Pain level controlled  Post-op Vital Signs: Reviewed and stable  Complications: No apparent anesthesia complications  Zeke Aker E 11:40 AM

## 2016-05-19 NOTE — Op Note (Signed)
[facility] Patient Name: Lawrence Hunter Procedure Date: 05/19/2016 10:50 AM MRN: [id number] Endoscopist: Remo Lipps P. Geary Rufo MD, MD Age: 66 Referring MD:  Date of Birth: [birth year] Gender: Male Account #: [id number] Procedure:                Colonoscopy Indications:              Screening for malignant neoplasm in the colon, This                            is the patient's first colonoscopy Medicines:                Monitored Anesthesia Care Procedure:                Pre-Anesthesia Assessment:                           - Prior to the procedure, a History and Physical                            was performed, and patient medications and                            allergies were reviewed. The patient's tolerance of                            previous anesthesia was also reviewed. The risks                            and benefits of the procedure and the sedation                            options and risks were discussed with the patient.                            All questions were answered, and informed consent                            was obtained. Prior Anticoagulants: The patient has                            taken aspirin, last dose was 1 day prior to                            procedure. ASA Grade Assessment: II - A patient                            with mild systemic disease. After reviewing the                            risks and benefits, the patient was deemed in                            satisfactory condition to undergo the procedure.  After obtaining informed consent, the colonoscope                            was passed under direct vision. Throughout the                            procedure, the patient's blood pressure, pulse, and                            oxygen saturations were monitored continuously. The                            Colonoscope was introduced through the anus and                            advanced to the  the cecum, identified by                            appendiceal orifice and ileocecal valve. The                            colonoscopy was performed without difficulty. The                            patient tolerated the procedure well. The quality                            of the bowel preparation was good. The ileocecal                            valve, appendiceal orifice, and rectum were                            photographed. Scope In: 11:05:57 AM Scope Out: 11:33:15 AM Scope Withdrawal Time: 0 hours 22 minutes 52 seconds  Total Procedure Duration: 0 hours 27 minutes 18 seconds  Findings:                 The perianal and digital rectal examinations were                            normal.                           Two sessile polyps were found in the hepatic                            flexure. The polyps were 5 mm in size. These polyps                            were removed with a cold snare. Resection and                            retrieval were complete.  Six sessile polyps were found in the transverse                            colon. The polyps were 4 to 7 mm in size. These                            polyps were removed with a cold snare. Resection                            and retrieval were complete.                           A 5 mm polyp was found in the descending colon. The                            polyp was sessile. The polyp was removed with a                            cold snare. Resection and retrieval were complete.                           Two sessile polyps were found in the sigmoid colon.                            The polyps were 4 to 5 mm in size. These polyps                            were removed with a cold snare. Resection and                            retrieval were complete.                           A 5 mm polyp was found in the recto-sigmoid colon.                            The polyp was sessile. The polyp was removed  with a                            cold snare. Resection and retrieval were complete.                           A 6 mm polyp was found in the recto-sigmoid colon.                            The polyp was pedunculated. The polyp was removed                            with a hot snare. Resection and retrieval were                            complete.  Internal hemorrhoids were found during                            retroflexion. The hemorrhoids were small.                           The exam was otherwise without abnormality. Complications:            No immediate complications. Estimated blood loss:                            Minimal. Estimated Blood Loss:     Estimated blood loss was minimal. Impression:               - Two 5 mm polyps at the hepatic flexure, removed                            with a cold snare. Resected and retrieved.                           - Six 4 to 7 mm polyps in the transverse colon,                            removed with a cold snare. Resected and retrieved.                           - One 5 mm polyp in the descending colon, removed                            with a cold snare. Resected and retrieved.                           - Two 4 to 5 mm polyps in the sigmoid colon,                            removed with a cold snare. Resected and retrieved.                           - One 5 mm polyp at the recto-sigmoid colon,                            removed with a cold snare. Resected and retrieved.                           - One 6 mm polyp at the recto-sigmoid colon,                            removed with a hot snare. Resected and retrieved.                           - Internal hemorrhoids.                           - The examination was otherwise  normal. Recommendation:           - Patient has a contact number available for                            emergencies. The signs and symptoms of potential                            delayed  complications were discussed with the                            patient. Return to normal activities tomorrow.                            Written discharge instructions were provided to the                            patient.                           - Resume previous diet.                           - Continue present medications.                           - No ibuprofen, naproxen, or other non-steroidal                            anti-inflammatory drugs for 2 weeks after polyp                            removal.                           - Await pathology results.                           - Repeat colonoscopy is recommended for                            surveillance. The colonoscopy date will be                            determined after pathology results from today's                            exam become available for review. Remo Lipps P. Haniel Fix MD, MD 05/19/2016 11:39:45 AM This report has been signed electronically.

## 2016-05-19 NOTE — Progress Notes (Signed)
Called to room to assist during endoscopic procedure.  Patient ID and intended procedure confirmed with present staff. Received instructions for my participation in the procedure from the performing physician.  

## 2016-05-20 ENCOUNTER — Telehealth: Payer: Self-pay | Admitting: *Deleted

## 2016-05-20 NOTE — Telephone Encounter (Signed)
  Follow up Call-  Call back number 05/19/2016  Post procedure Call Back phone  # [phone]  Permission to leave phone message Yes  Some recent data might be hidden     Patient questions:  Do you have a fever, pain , or abdominal swelling? No. Pain Score  0 *  Have you tolerated food without any problems? Yes.    Have you been able to return to your normal activities? Yes.    Do you have any questions about your discharge instructions: Diet   No. Medications  No. Follow up visit  No.  Do you have questions or concerns about your Care? No.  Actions: * If pain score is 4 or above: No action needed, pain <4.

## 2016-05-23 ENCOUNTER — Encounter: Payer: Self-pay | Admitting: Family Medicine

## 2016-05-26 DIAGNOSIS — L570 Actinic keratosis: Secondary | ICD-10-CM | POA: Diagnosis not present

## 2016-05-26 DIAGNOSIS — D485 Neoplasm of uncertain behavior of skin: Secondary | ICD-10-CM | POA: Diagnosis not present

## 2016-05-27 ENCOUNTER — Other Ambulatory Visit (INDEPENDENT_AMBULATORY_CARE_PROVIDER_SITE_OTHER): Payer: PPO

## 2016-05-27 DIAGNOSIS — E039 Hypothyroidism, unspecified: Secondary | ICD-10-CM

## 2016-05-27 LAB — TSH: TSH: 3.19 u[IU]/mL (ref 0.35–4.50)

## 2016-05-28 ENCOUNTER — Encounter: Payer: Self-pay | Admitting: Gastroenterology

## 2016-05-31 ENCOUNTER — Encounter: Payer: Self-pay | Admitting: Family Medicine

## 2016-06-26 ENCOUNTER — Other Ambulatory Visit: Payer: Self-pay | Admitting: Family Medicine

## 2016-07-01 DIAGNOSIS — H903 Sensorineural hearing loss, bilateral: Secondary | ICD-10-CM | POA: Diagnosis not present

## 2016-07-01 DIAGNOSIS — R0982 Postnasal drip: Secondary | ICD-10-CM | POA: Diagnosis not present

## 2016-07-26 ENCOUNTER — Telehealth: Payer: Self-pay

## 2016-07-26 MED ORDER — LEVOTHYROXINE SODIUM 112 MCG PO TABS: 112.0000 ug | ORAL_TABLET | Freq: Every day | ORAL | 0 refills | Status: DC

## 2016-07-26 NOTE — Telephone Encounter (Signed)
Fax received from pharmacy requesting #90 day supply on Levothyroxine instead of the #30 day with refill.  Pharmacy called and approved to change to #90 day supply.

## 2016-09-23 ENCOUNTER — Other Ambulatory Visit: Payer: Self-pay | Admitting: Family Medicine

## 2016-09-23 NOTE — Telephone Encounter (Signed)
[facility].  RF request for levothyroxine LOV: 03/18/16  Next ov: None Last written: 07/26/16 #90 w/ 0RF  05/27/16 TSH 3.19

## 2016-11-17 ENCOUNTER — Other Ambulatory Visit: Payer: Self-pay | Admitting: Family Medicine

## 2016-12-07 ENCOUNTER — Other Ambulatory Visit: Payer: Self-pay | Admitting: Cardiovascular Disease

## 2016-12-20 ENCOUNTER — Other Ambulatory Visit: Payer: Self-pay | Admitting: *Deleted

## 2016-12-20 MED ORDER — MONTELUKAST SODIUM 10 MG PO TABS: 10.0000 mg | ORAL_TABLET | Freq: Every day | ORAL | 0 refills | Status: DC

## 2016-12-20 MED ORDER — LEVOTHYROXINE SODIUM 112 MCG PO TABS: 112.0000 ug | ORAL_TABLET | Freq: Every day | ORAL | 0 refills | Status: DC

## 2016-12-29 ENCOUNTER — Other Ambulatory Visit: Payer: Self-pay | Admitting: Cardiovascular Disease

## 2016-12-29 DIAGNOSIS — E7800 Pure hypercholesterolemia, unspecified: Secondary | ICD-10-CM

## 2017-01-07 ENCOUNTER — Other Ambulatory Visit: Payer: Self-pay | Admitting: Cardiovascular Disease

## 2017-01-24 ENCOUNTER — Other Ambulatory Visit: Payer: Self-pay | Admitting: Cardiovascular Disease

## 2017-02-04 ENCOUNTER — Other Ambulatory Visit: Payer: Self-pay | Admitting: Cardiovascular Disease

## 2017-02-04 DIAGNOSIS — E7800 Pure hypercholesterolemia, unspecified: Secondary | ICD-10-CM

## 2017-03-14 ENCOUNTER — Other Ambulatory Visit: Payer: Self-pay

## 2017-03-14 DIAGNOSIS — J309 Allergic rhinitis, unspecified: Secondary | ICD-10-CM

## 2017-03-14 MED ORDER — MONTELUKAST SODIUM 10 MG PO TABS: 10.0000 mg | ORAL_TABLET | Freq: Every day | ORAL | 0 refills | Status: DC

## 2017-03-14 NOTE — Telephone Encounter (Signed)
Refill request sent to pharmacy.

## 2017-03-24 ENCOUNTER — Other Ambulatory Visit: Payer: Self-pay | Admitting: Cardiovascular Disease

## 2017-03-24 MED ORDER — METOPROLOL TARTRATE 25 MG PO TABS: 25.0000 mg | ORAL_TABLET | Freq: Two times a day (BID) | ORAL | 0 refills | Status: DC

## 2017-04-23 NOTE — Progress Notes (Signed)
Chief Complaint  Patient presents with  . Coronary Artery Disease     History of Present Illness: 67 yo male with history of CAD, HLD, tobacco abuse and hypothyroidism here today for cardiac follow up. He had a NSTEMI in January 2005 and had a Cypher drug eluting stent placed in the occluded Circumflex and a Cypher drug eluting stent placed in the RCA. He has had no further caths since then. Stress myoview in July 2012 showed no ischemia. Exercise stress test August 2016 with no ischemia.   He is here today for follow up. The patient denies any chest pain, dyspnea, palpitations, lower extremity edema, orthopnea, PND, dizziness, near syncope or syncope. He has daytime fatigue. He thinks this may be due to his beta blocker.   Primary Care Physician: Tammi Sou, MD  Past Medical History:  Diagnosis Date  . Allergy   . Arthralgia of both knees 09/2015   Plain films with minimal DJD changes medial compartment, o/w nl.  . Arthritis   . Claudication ([place])    Normal ABI's in 2006  . Coronary artery disease   . History of adenomatous polyp of colon 05/19/2016   Recall 04/2021 (Dr. Havery Moros)  . Hyperlipidemia   . Hypothyroidism   . IFG (impaired fasting glucose) 02/2016   HbA1c 6.1%  . Nephrolithiasis    passed one stone approx 2008; no prob since (saw urologist briefly)  . Normal nuclear stress test 2012  . Prostatitis    When pt in 63s and 72s; saw Dr. Gaynelle Arabian and eventually got a TURP per pt's description.  No probs since then.  . Recurrent sinusitis    Sinus plain films 08/2014: bilat maxillary sinusitis (chronic)  . S/P coronary artery stent placement 2005   RCA and LCX    Past Surgical History:  Procedure Laterality Date  . APPENDECTOMY    . CARDIAC CATHETERIZATION  04/16/2003   NORMAL. EF 60%. TOTALLY OCCLUDED LEFT CIRCUMFLEX WITH SEVERE DISEASE, AND ULCERATED PLAQUE IN THE PROXIMAL RIGHT CORONARY ARTERY AND MILD ATHERSCLEROSIS IN THE LEFT ANTERIOR DESCENDING  .  CARDIOVASCULAR STRESS TEST  2009;2012; 10/2014   2012 normal nuclear stress test.  2016 ETT no ischemia.  . COLONOSCOPY W/ POLYPECTOMY  05/19/2016   Multiple polyps--tubular adenoma.  Recall 5 yrs (04/2021).  . CORONARY STENT PLACEMENT  04/19/2003   STENT PLACEMENT IN THE LEFT CIRCUMFLEX CORONARY AND RIGHT CORONARY ARTERY. MILD RESIDUAL STENOSIS IN THE MID PORTION OF THE LEFT ANTERIOR DESCENDING    Current Outpatient Medications  Medication Sig Dispense Refill  . aspirin 81 MG tablet Take 81 mg by mouth daily.      Marland Kitchen atorvastatin (LIPITOR) 80 MG tablet Take 1 tablet (80 mg total) by mouth daily. 90 tablet 3  . Cyanocobalamin (VITAMIN B-12 PO) Take 1 tablet by mouth daily.    . fluticasone (FLONASE) 50 MCG/ACT nasal spray Place 2 sprays into both nostrils daily. 16 g 11  . levothyroxine (SYNTHROID, LEVOTHROID) 112 MCG tablet Take 1 tablet (112 mcg total) by mouth daily. 90 tablet 0  . montelukast (SINGULAIR) 10 MG tablet Take 1 tablet (10 mg total) by mouth at bedtime. 90 tablet 0  . metoprolol tartrate (LOPRESSOR) 25 MG tablet Take 1 tablet (25 mg total) by mouth 2 (two) times daily. 180 tablet 3   Current Facility-Administered Medications  Medication Dose Route Frequency Provider Last Rate Last Dose  . 0.9 %  sodium chloride infusion  500 mL Intravenous Continuous Armbruster, Carlota Raspberry, MD  Allergies  Allergen Reactions  . Plavix [Clopidogrel Bisulfate] Rash    Social History   Socioeconomic History  . Marital status: Married    Spouse name: Not on file  . Number of children: 2  . Years of education: Not on file  . Highest education level: Not on file  Social Needs  . Financial resource strain: Not on file  . Food insecurity - worry: Not on file  . Food insecurity - inability: Not on file  . Transportation needs - medical: Not on file  . Transportation needs - non-medical: Not on file  Occupational History  . Occupation: Press photographer Rep  Tobacco Use  . Smoking status:  Current Every Day Smoker    Packs/day: 1.00    Years: 30.00    Pack years: 30.00  . Smokeless tobacco: Never Used  Substance and Sexual Activity  . Alcohol use: No  . Drug use: No  . Sexual activity: Yes  Other Topics Concern  . Not on file  Social History Narrative   Married, 1 son in [place] and one in [place].   Lives in [place].   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   Tob: 40 pack-yr hx, current as of 04/2014.   Alcohol: social/rare.    Family History  Problem Relation Age of Onset  . Hypertension Mother   . Stroke Mother   . Hypertension Father   . Stroke Father   . Prostate cancer Father   . Heart attack Brother        STENT  . Colon cancer Neg Hx     Review of Systems:  As stated in the HPI and otherwise negative.   BP 112/80   Pulse (!) 59   Ht 5\' 11"  (1.803 m)   Wt 196 lb 9.6 oz (89.2 kg)   SpO2 96%   BMI 27.42 kg/m   Physical Examination: General: Well developed, well nourished, NAD  HEENT: OP clear, mucus membranes moist  SKIN: warm, dry. No rashes. Neuro: No focal deficits  Musculoskeletal: Muscle strength 5/5 all ext  Psychiatric: Mood and affect normal  Neck: No JVD, no carotid bruits, no thyromegaly, no lymphadenopathy.  Lungs:Clear bilaterally, no wheezes, rhonci, crackles Cardiovascular: Regular rate and rhythm. No murmurs, gallops or rubs. Abdomen:Soft. Bowel sounds present. Non-tender.  Extremities: No lower extremity edema. Pulses are 2 + in the bilateral DP/PT.  EKG:  EKG is ordered today. The ekg ordered today demonstrates  Sinus brady, rate 59 bpm.   Recent Labs: 05/27/2016: TSH 3.19   Lipid Panel    Component Value Date/Time   CHOL 153 01/16/2016 0848   TRIG 125 01/16/2016 0848   HDL 41 01/16/2016 0848   CHOLHDL 3.7 01/16/2016 0848   VLDL 25 01/16/2016 0848   LDLCALC 87 01/16/2016 0848     Wt Readings from Last 3 Encounters:  04/25/17 196 lb 9.6 oz (89.2 kg)    05/19/16 205 lb (93 kg)  05/05/16 205 lb 6.4 oz (93.2 kg)     Other studies Reviewed: Additional studies/ records that were reviewed today include: . Review of the above records demonstrates:    Assessment and Plan:   1. CAD without angina: No chest pain suggestive of angina. Will continue ASA, statin and beta blocker. (see below. He will try holding the beta blocker for a week and see if his energy level improves).   2. Tobacco abuse: Smoking cessation is encouraged. He wishes to  stop.   3. Hyperlipidemia: Continue statin. Will check lipids and LFTs today  4. Fatigue: this is occasional. He continues to work in the yard without any chest pain or dyspnea. HR is 60. He denies snoring. I have reviewed signs/sx of sleep apnea. Will try holding beta blocker for one week. If fatigue improves, will lower dose of Lopressor to 12.5 mg po BID. If no change, will resume Lopressor 25 mg po BID. Will arrange echo to assess LV function.     Current medicines are reviewed at length with the patient today.  The patient does not have concerns regarding medicines.  The following changes have been made:  no change  Labs/ tests ordered today include:   Orders Placed This Encounter  Procedures  . Lipid Profile  . Hepatic function panel  . EKG 12-Lead  . ECHOCARDIOGRAM COMPLETE    Disposition:   FU with me in 12 months  Signed, Lauree Chandler, MD 04/25/2017 10:58 AM    [facility] [place], [place], [place]  [address] Phone: [phone]; Fax: [phone]

## 2017-04-25 ENCOUNTER — Encounter: Payer: Self-pay | Admitting: Cardiovascular Disease

## 2017-04-25 ENCOUNTER — Encounter (INDEPENDENT_AMBULATORY_CARE_PROVIDER_SITE_OTHER): Payer: Self-pay

## 2017-04-25 ENCOUNTER — Ambulatory Visit: Payer: PPO | Admitting: Cardiovascular Disease

## 2017-04-25 VITALS — BP 112/80 | HR 59 | Ht 71.0 in | Wt 196.6 lb

## 2017-04-25 DIAGNOSIS — E7800 Pure hypercholesterolemia, unspecified: Secondary | ICD-10-CM

## 2017-04-25 DIAGNOSIS — R5383 Other fatigue: Secondary | ICD-10-CM | POA: Diagnosis not present

## 2017-04-25 DIAGNOSIS — I2510 Atherosclerotic heart disease of native coronary artery without angina pectoris: Secondary | ICD-10-CM

## 2017-04-25 DIAGNOSIS — Z720 Tobacco use: Secondary | ICD-10-CM | POA: Diagnosis not present

## 2017-04-25 LAB — HEPATIC FUNCTION PANEL
ALBUMIN: 4.4 g/dL (ref 3.6–4.8)
ALT: 34 IU/L (ref 0–44)
AST: 21 IU/L (ref 0–40)
Alkaline Phosphatase: 126 IU/L — ABNORMAL HIGH (ref 39–117)
Bilirubin Total: 0.7 mg/dL (ref 0.0–1.2)
Bilirubin, Direct: 0.21 mg/dL (ref 0.00–0.40)
Total Protein: 6.7 g/dL (ref 6.0–8.5)

## 2017-04-25 LAB — LIPID PANEL
CHOLESTEROL TOTAL: 129 mg/dL (ref 100–199)
Chol/HDL Ratio: 3.2 ratio (ref 0.0–5.0)
HDL: 40 mg/dL (ref >39)
LDL CALC: 66 mg/dL (ref 0–99)
Triglycerides: 116 mg/dL (ref 0–149)
VLDL CHOLESTEROL CAL: 23 mg/dL (ref 5–40)

## 2017-04-25 MED ORDER — METOPROLOL TARTRATE 25 MG PO TABS: 25.0000 mg | ORAL_TABLET | Freq: Two times a day (BID) | ORAL | 3 refills | Status: DC

## 2017-04-25 MED ORDER — ATORVASTATIN CALCIUM 80 MG PO TABS: 80.0000 mg | ORAL_TABLET | Freq: Every day | ORAL | 3 refills | Status: DC

## 2017-04-25 NOTE — Patient Instructions (Signed)
Medication Instructions:  Your physician recommends that you continue on your current medications as directed. Please refer to the Current Medication list given to you today.   Labwork: Lab work to be done today--Lipid and Liver profiles  Testing/Procedures: Your physician has requested that you have an echocardiogram. Echocardiography is a painless test that uses sound waves to create images of your heart. It provides your doctor with information about the size and shape of your heart and how well your heart's chambers and valves are working. This procedure takes approximately one hour. There are no restrictions for this procedure.    Follow-Up: Your physician recommends that you schedule a follow-up appointment in: 12 months. Please call our office in about 9 months to schedule this appointment    Any Other Special Instructions Will Be Listed Below (If Applicable).     If you need a refill on your cardiac medications before your next appointment, please call your pharmacy.

## 2017-05-02 ENCOUNTER — Encounter: Payer: Self-pay | Admitting: Family Medicine

## 2017-05-03 ENCOUNTER — Other Ambulatory Visit: Payer: Self-pay

## 2017-05-03 ENCOUNTER — Ambulatory Visit (HOSPITAL_COMMUNITY): Payer: PPO | Attending: Cardiology

## 2017-05-03 DIAGNOSIS — E039 Hypothyroidism, unspecified: Secondary | ICD-10-CM | POA: Diagnosis not present

## 2017-05-03 DIAGNOSIS — I2510 Atherosclerotic heart disease of native coronary artery without angina pectoris: Secondary | ICD-10-CM | POA: Diagnosis not present

## 2017-05-03 DIAGNOSIS — I700 Atherosclerosis of aorta: Secondary | ICD-10-CM | POA: Insufficient documentation

## 2017-05-03 DIAGNOSIS — I517 Cardiomegaly: Secondary | ICD-10-CM | POA: Insufficient documentation

## 2017-05-03 DIAGNOSIS — R5383 Other fatigue: Secondary | ICD-10-CM | POA: Diagnosis not present

## 2017-05-03 DIAGNOSIS — E785 Hyperlipidemia, unspecified: Secondary | ICD-10-CM | POA: Insufficient documentation

## 2017-05-03 HISTORY — PX: TRANSTHORACIC ECHOCARDIOGRAM: SHX275

## 2017-05-05 ENCOUNTER — Telehealth: Payer: Self-pay | Admitting: Cardiovascular Disease

## 2017-05-05 DIAGNOSIS — I7789 Other specified disorders of arteries and arterioles: Secondary | ICD-10-CM | POA: Insufficient documentation

## 2017-05-05 NOTE — Telephone Encounter (Signed)
Left a message for the pt to call the office back.

## 2017-05-05 NOTE — Telephone Encounter (Signed)
Patient returning call for results 

## 2017-05-05 NOTE — Telephone Encounter (Addendum)
Notes recorded by Burnell Blanks, MD on 04/26/2017 at 1:48 PM EST Lipids and LFTs are ok. cdm    Notes recorded by Burnell Blanks, MD on 05/04/2017 at 1:10 PM EST LV function is normal. No valve disease. The walls of his heart are a little thickened. His aortic root is slightly enlarged. He will need a chest CTA to evaluate the aorta. cdm   Spoke with the pt and informed him that per Dr. Angelena Form, his lipids and LFTs were ok.   Also informed the pt that his echo showed LV function is normal, no valve disease, and the walls of his heart are a little thickened. Informed the pt that his aortic root is slightly enlarged, and Dr. Angelena Form recommends that we order for him to have a chest CTA done, to evaluate the aorta.  Informed the pt that I will place the order in the system and have a [facility] scheduler call him back to arrange this appt.  Pt verbalized understanding and agrees with this plan.

## 2017-05-09 ENCOUNTER — Other Ambulatory Visit: Payer: Self-pay

## 2017-05-09 DIAGNOSIS — Z01812 Encounter for preprocedural laboratory examination: Secondary | ICD-10-CM

## 2017-05-09 DIAGNOSIS — I77810 Thoracic aortic ectasia: Secondary | ICD-10-CM

## 2017-05-09 NOTE — Progress Notes (Signed)
Patient needs BMET prior to CT. 

## 2017-05-12 ENCOUNTER — Other Ambulatory Visit: Payer: PPO | Admitting: *Deleted

## 2017-05-12 DIAGNOSIS — I77810 Thoracic aortic ectasia: Secondary | ICD-10-CM | POA: Diagnosis not present

## 2017-05-12 DIAGNOSIS — Z01812 Encounter for preprocedural laboratory examination: Secondary | ICD-10-CM

## 2017-05-12 LAB — BASIC METABOLIC PANEL
BUN / CREAT RATIO: 14 (ref 10–24)
BUN: 13 mg/dL (ref 8–27)
CO2: 24 mmol/L (ref 20–29)
CREATININE: 0.92 mg/dL (ref 0.76–1.27)
Calcium: 9.5 mg/dL (ref 8.6–10.2)
Chloride: 104 mmol/L (ref 96–106)
GFR calc non Af Amer: 85 mL/min/{1.73_m2} (ref >59)
GFR, EST AFRICAN AMERICAN: 98 mL/min/{1.73_m2} (ref >59)
Glucose: 73 mg/dL (ref 65–99)
Potassium: 4.2 mmol/L (ref 3.5–5.2)
Sodium: 143 mmol/L (ref 134–144)

## 2017-05-18 ENCOUNTER — Other Ambulatory Visit: Payer: PPO

## 2017-05-19 ENCOUNTER — Ambulatory Visit (INDEPENDENT_AMBULATORY_CARE_PROVIDER_SITE_OTHER)
Admission: RE | Admit: 2017-05-19 | Discharge: 2017-05-19 | Disposition: A | Discharge status: Home / Self Care | Payer: PPO | Source: Ambulatory Visit | Attending: Cardiovascular Disease | Admitting: Cardiovascular Disease

## 2017-05-19 DIAGNOSIS — I7789 Other specified disorders of arteries and arterioles: Secondary | ICD-10-CM | POA: Diagnosis not present

## 2017-05-19 DIAGNOSIS — I700 Atherosclerosis of aorta: Secondary | ICD-10-CM | POA: Diagnosis not present

## 2017-05-19 DIAGNOSIS — I77810 Thoracic aortic ectasia: Secondary | ICD-10-CM | POA: Diagnosis not present

## 2017-05-19 MED ORDER — IOPAMIDOL (ISOVUE-370) INJECTION 76%
100.0000 mL | Freq: Once | INTRAVENOUS | Status: AC | PRN
  Administered 2017-05-19: 100 mL via INTRAVENOUS

## 2017-05-20 ENCOUNTER — Telehealth: Payer: Self-pay | Admitting: *Deleted

## 2017-05-20 MED ORDER — METOPROLOL TARTRATE 25 MG PO TABS: 12.5000 mg | ORAL_TABLET | Freq: Two times a day (BID) | ORAL | 3 refills | Status: DC

## 2017-05-20 NOTE — Telephone Encounter (Signed)
Agree. Thanks. chris 

## 2017-05-20 NOTE — Telephone Encounter (Signed)
I spoke with pt and reviewed CT results with him. He reports he has been holding metoprolol since last office visit and his fatigue has improved.  Per Dr. Camillia Herter last note I advised pt to resume metoprolol tartrate 12.5 mg by mouth twice daily.  I asked pt to call us if unable to tolerate this dose.

## 2017-05-28 ENCOUNTER — Encounter: Payer: Self-pay | Admitting: Gastroenterology

## 2017-06-14 ENCOUNTER — Other Ambulatory Visit: Payer: Self-pay | Admitting: Family Medicine

## 2017-06-14 DIAGNOSIS — J309 Allergic rhinitis, unspecified: Secondary | ICD-10-CM

## 2017-06-20 ENCOUNTER — Telehealth: Payer: Self-pay

## 2017-06-20 ENCOUNTER — Other Ambulatory Visit: Payer: Self-pay

## 2017-06-20 MED ORDER — LEVOTHYROXINE SODIUM 112 MCG PO TABS: 112.0000 ug | ORAL_TABLET | Freq: Every day | ORAL | 0 refills | Status: DC

## 2017-06-20 NOTE — Telephone Encounter (Signed)
Phone call attempted, no answer. Patient needs appointment for further refills on medication. Okay for [facility] nurse to inform.

## 2017-08-02 ENCOUNTER — Encounter: Payer: Self-pay | Admitting: Family Medicine

## 2017-08-02 ENCOUNTER — Ambulatory Visit (INDEPENDENT_AMBULATORY_CARE_PROVIDER_SITE_OTHER): Payer: PPO | Admitting: Family Medicine

## 2017-08-02 VITALS — BP 122/78 | HR 90 | Temp 98.3°F | Resp 20 | Ht 71.0 in | Wt 199.5 lb

## 2017-08-02 DIAGNOSIS — J0110 Acute frontal sinusitis, unspecified: Secondary | ICD-10-CM | POA: Diagnosis not present

## 2017-08-02 MED ORDER — PREDNISONE 20 MG PO TABS: ORAL_TABLET | ORAL | 0 refills | Status: DC

## 2017-08-02 MED ORDER — DOXYCYCLINE HYCLATE 100 MG PO TABS: 100.0000 mg | ORAL_TABLET | Freq: Two times a day (BID) | ORAL | 0 refills | Status: DC

## 2017-08-02 NOTE — Patient Instructions (Signed)
Rest, hydrate.  + Flonase, mucinex (DM if cough), nasal saline.  Doxycyline and prednisone prescribed, take until completed.  If cough present it can last up to 6-8 weeks.  F/U 2 weeks of not improved.    Sinusitis, Adult Sinusitis is soreness and inflammation of your sinuses. Sinuses are hollow spaces in the bones around your face. They are located:  Around your eyes.  In the middle of your forehead.  Behind your nose.  In your cheekbones.  Your sinuses and nasal passages are lined with a stringy fluid (mucus). Mucus normally drains out of your sinuses. When your nasal tissues get inflamed or swollen, the mucus can get trapped or blocked so air cannot flow through your sinuses. This lets bacteria, viruses, and funguses grow, and that leads to infection. Follow these instructions at home: Medicines  Take, use, or apply over-the-counter and prescription medicines only as told by your doctor. These may include nasal sprays.  If you were prescribed an antibiotic medicine, take it as told by your doctor. Do not stop taking the antibiotic even if you start to feel better. Hydrate and Humidify  Drink enough water to keep your pee (urine) clear or pale yellow.  Use a cool mist humidifier to keep the humidity level in your home above 50%.  Breathe in steam for 10-15 minutes, 3-4 times a day or as told by your doctor. You can do this in the bathroom while a hot shower is running.  Try not to spend time in cool or dry air. Rest  Rest as much as possible.  Sleep with your head raised (elevated).  Make sure to get enough sleep each night. General instructions  Put a warm, moist washcloth on your face 3-4 times a day or as told by your doctor. This will help with discomfort.  Wash your hands often with soap and water. If there is no soap and water, use hand sanitizer.  Do not smoke. Avoid being around people who are smoking (secondhand smoke).  Keep all follow-up visits as told by  your doctor. This is important. Contact a doctor if:  You have a fever.  Your symptoms get worse.  Your symptoms do not get better within 10 days. Get help right away if:  You have a very bad headache.  You cannot stop throwing up (vomiting).  You have pain or swelling around your face or eyes.  You have trouble seeing.  You feel confused.  Your neck is stiff.  You have trouble breathing. This information is not intended to replace advice given to you by your health care provider. Make sure you discuss any questions you have with your health care provider. Document Released: 08/25/2007 Document Revised: 11/02/2015 Document Reviewed: 01/01/2015 Elsevier Interactive Patient Education  [employer].

## 2017-08-02 NOTE — Progress Notes (Signed)
Lawrence Hunter , [birth year], 67 y.o., male MRN: [id number] Patient Care Team    Relationship Specialty Notifications Start End  McGowen, Adrian Blackwater, MD PCP - General Family Medicine  04/23/14    Comment: Awilda Metro, Annita Brod, MD PCP - Cardiology Cardiology Admissions 04/29/17   Burnell Blanks, MD Consulting Physician Cardiology  04/23/14   Armbruster, Carlota Raspberry, MD Consulting Physician Gastroenterology  05/31/16     Chief Complaint  Patient presents with  . URI    facial pressure,headache     Subjective: Pt presents for an OV with complaints of facial pressure of 2 days duration.  Associated symptoms include headache, increased congestion, and phlegm production a few weeks.  Pt has tried Flonase, singular and Claritin D to ease their symptoms. He is a smoker.  He denies fever, chills, nausea or productive cough.   Depression screen PHQ 2/9 08/21/2014  Decreased Interest 0  Down, Depressed, Hopeless 0  PHQ - 2 Score 0    Allergies  Allergen Reactions  . Plavix [Clopidogrel Bisulfate] Rash   Social History   Tobacco Use  . Smoking status: Current Every Day Smoker    Packs/day: 1.00    Years: 30.00    Pack years: 30.00  . Smokeless tobacco: Never Used  Substance Use Topics  . Alcohol use: No   Past Medical History:  Diagnosis Date  . Allergy   . Arthralgia of both knees 09/2015   Plain films with minimal DJD changes medial compartment, o/w nl.  . Arthritis   . Claudication ([place])    Normal ABI's in 2006  . Coronary artery disease    s/p stents  . History of adenomatous polyp of colon 05/19/2016   Recall 04/2021 (Dr. Havery Moros)  . Hyperlipidemia   . Hypothyroidism   . IFG (impaired fasting glucose) 02/2016   HbA1c 6.1%  . Nephrolithiasis    passed one stone approx 2008; no prob since (saw urologist briefly)  . Normal nuclear stress test 2012  . Prostatitis    When pt in 27s and 23s; saw Dr. Gaynelle Arabian and eventually got a TURP per pt's  description.  No probs since then.  . Recurrent sinusitis    Sinus plain films 08/2014: bilat maxillary sinusitis (chronic)  . S/P coronary artery stent placement 2005   RCA and LCX   Past Surgical History:  Procedure Laterality Date  . APPENDECTOMY    . CARDIAC CATHETERIZATION  04/16/2003   NORMAL. EF 60%. TOTALLY OCCLUDED LEFT CIRCUMFLEX WITH SEVERE DISEASE, AND ULCERATED PLAQUE IN THE PROXIMAL RIGHT CORONARY ARTERY AND MILD ATHERSCLEROSIS IN THE LEFT ANTERIOR DESCENDING  . CARDIOVASCULAR STRESS TEST  2009;2012; 10/2014   2012 normal nuclear stress test.  2016 ETT no ischemia.  . COLONOSCOPY W/ POLYPECTOMY  05/19/2016   Multiple polyps--tubular adenoma.  Recall 5 yrs (04/2021).  . CORONARY STENT PLACEMENT  04/19/2003   STENT PLACEMENT IN THE LEFT CIRCUMFLEX CORONARY AND RIGHT CORONARY ARTERY. MILD RESIDUAL STENOSIS IN THE MID PORTION OF THE LEFT ANTERIOR DESCENDING   Family History  Problem Relation Age of Onset  . Hypertension Mother   . Stroke Mother   . Hypertension Father   . Stroke Father   . Prostate cancer Father   . Heart attack Brother        STENT  . Colon cancer Neg Hx    Allergies as of 08/02/2017      Reactions   Plavix [clopidogrel Bisulfate] Rash      Medication  List        Accurate as of 08/02/17  2:33 PM. Always use your most recent med list.          aspirin 81 MG tablet Take 81 mg by mouth daily.   atorvastatin 80 MG tablet Commonly known as:  LIPITOR Take 1 tablet (80 mg total) by mouth daily.   fluticasone 50 MCG/ACT nasal spray Commonly known as:  FLONASE Place 2 sprays into both nostrils daily.   levothyroxine 112 MCG tablet Commonly known as:  SYNTHROID, LEVOTHROID Take 1 tablet (112 mcg total) by mouth daily. Appointment needed for further refills   metoprolol tartrate 25 MG tablet Commonly known as:  LOPRESSOR Take 0.5 tablets (12.5 mg total) by mouth 2 (two) times daily.   montelukast 10 MG tablet Commonly known as:  SINGULAIR TAKE  1 TABLET BY MOUTH EVERYDAY AT BEDTIME   VITAMIN B-12 PO Take 1 tablet by mouth daily.       All past medical history, surgical history, allergies, family history, immunizations andmedications were updated in the EMR today and reviewed under the history and medication portions of their EMR.     ROS: Negative, with the exception of above mentioned in HPI   Objective:  BP 122/78 (BP Location: Right Arm, Patient Position: Sitting, Cuff Size: Large)   Pulse 90   Temp 98.3 F (36.8 C)   Resp 20   Ht 5\' 11"  (1.803 m)   Wt 199 lb 8 oz (90.5 kg)   SpO2 97%   BMI 27.82 kg/m  Body mass index is 27.82 kg/m. Gen: Afebrile. No acute distress. Nontoxic in appearance, well developed, well nourished.  HENT: AT. [place]. Bilateral TM visualized without erythema or bulging. MMM, no oral lesions. Bilateral nares with erythema, mild bleeding, drainage. Throat without erythema or exudates. No cough on exam. Raspy voice (smoker). TTP frontal sinus.  Eyes:Pupils Equal Round Reactive to light, Extraocular movements intact,  Conjunctiva without redness, discharge or icterus. Neck/lymp/endocrine: Supple,no lymphadenopathy CV: RRR Chest: CTAB, no wheeze or crackles. Good air movement, normal resp effort.  Skin: no rashes, purpura or petechiae.  Neuro:  Normal gait. PERLA. EOMi. Alert. Oriented x3   No exam data present No results found. No results found for this or any previous visit (from the past 24 hour(s)).  Assessment/Plan: Lawrence Hunter is a 67 y.o. male present for OV for  Acute non-recurrent frontal sinusitis Most symptoms surrounding frontal sinus infection, he does have some increased phlegm/chronic bronchitis symptoms that have been chronic. However, lung exam was good.  Rest, hydrate.  + Flonase, mucinex (DM if cough), nettie pot or nasal saline.  Doxycyline and prednisone prescribed, take until completed.  If cough present it can last up to 6-8 weeks.  F/U 2 weeks of not improved.     Reviewed expectations re: course of current medical issues.  Discussed self-management of symptoms.  Outlined signs and symptoms indicating need for more acute intervention.  Patient verbalized understanding and all questions were answered.  Patient received an After-Visit Summary.    No orders of the defined types were placed in this encounter.    Note is dictated utilizing voice recognition software. Although note has been proof read prior to signing, occasional typographical errors still can be missed. If any questions arise, please do not hesitate to call for verification.   electronically signed by:  Howard Pouch, DO  [place]

## 2017-09-09 ENCOUNTER — Other Ambulatory Visit: Payer: Self-pay

## 2017-09-09 MED ORDER — LEVOTHYROXINE SODIUM 112 MCG PO TABS: 112.0000 ug | ORAL_TABLET | Freq: Every day | ORAL | 0 refills | Status: DC

## 2017-09-18 ENCOUNTER — Other Ambulatory Visit: Payer: Self-pay | Admitting: Family Medicine

## 2017-09-19 NOTE — Telephone Encounter (Signed)
Pt is over due for f/u RCI (routine chronic illnesses), needs office visit for more refills. Rx was sent on 09/08/17 for #30 w/ 0RF. Pt needs to schedule follow up w/ Dr. Anitra Lauth for more refills.

## 2017-09-19 NOTE — Telephone Encounter (Signed)
Needs office visit for more refills.

## 2017-09-19 NOTE — Telephone Encounter (Signed)
Left message for pt to call back  °

## 2017-10-04 ENCOUNTER — Ambulatory Visit (INDEPENDENT_AMBULATORY_CARE_PROVIDER_SITE_OTHER): Payer: PPO | Admitting: Family Medicine

## 2017-10-04 ENCOUNTER — Encounter: Payer: Self-pay | Admitting: Family Medicine

## 2017-10-04 ENCOUNTER — Other Ambulatory Visit: Payer: Self-pay | Admitting: Family Medicine

## 2017-10-04 VITALS — BP 104/54 | HR 63 | Temp 98.5°F | Resp 16 | Ht 71.0 in | Wt 192.5 lb

## 2017-10-04 DIAGNOSIS — R7303 Prediabetes: Secondary | ICD-10-CM

## 2017-10-04 DIAGNOSIS — E7800 Pure hypercholesterolemia, unspecified: Secondary | ICD-10-CM

## 2017-10-04 DIAGNOSIS — F17200 Nicotine dependence, unspecified, uncomplicated: Secondary | ICD-10-CM

## 2017-10-04 DIAGNOSIS — I10 Essential (primary) hypertension: Secondary | ICD-10-CM

## 2017-10-04 DIAGNOSIS — E039 Hypothyroidism, unspecified: Secondary | ICD-10-CM | POA: Diagnosis not present

## 2017-10-04 DIAGNOSIS — E663 Overweight: Secondary | ICD-10-CM | POA: Diagnosis not present

## 2017-10-04 LAB — BASIC METABOLIC PANEL
BUN: 14 mg/dL (ref 6–23)
CALCIUM: 9.8 mg/dL (ref 8.4–10.5)
CO2: 28 mEq/L (ref 19–32)
Chloride: 106 mEq/L (ref 96–112)
Creatinine, Ser: 0.98 mg/dL (ref 0.40–1.50)
GFR: 80.7 mL/min (ref >60.00)
GLUCOSE: 105 mg/dL — AB (ref 70–99)
Potassium: 4.3 mEq/L (ref 3.5–5.1)
SODIUM: 142 meq/L (ref 135–145)

## 2017-10-04 LAB — HEMOGLOBIN A1C: Hgb A1c MFr Bld: 6.3 % (ref 4.6–6.5)

## 2017-10-04 LAB — TSH: TSH: 0.46 u[IU]/mL (ref 0.35–4.50)

## 2017-10-04 MED ORDER — LEVOTHYROXINE SODIUM 112 MCG PO TABS
112.0000 ug | ORAL_TABLET | Freq: Every day | ORAL | 3 refills | Status: DC
Start: 2017-10-04 — End: 2018-07-17

## 2017-10-04 NOTE — Patient Instructions (Signed)
Buy nicotine patches: 21 mg patch, apply one per day for 1 month, then change to 14mg  patch and apply one per day for 1 month, then change to 7 mg patch and apply 1 per day for 1 month.  Then no more nicotine patch.  You can also use nicotine gum or lozenge : 1 of these up to 4 times per day as needed for strong cravings.  Do not smoke cigarettes while you are taking nicotine.

## 2017-10-04 NOTE — Progress Notes (Signed)
OFFICE VISIT  10/04/2017   CC:  Chief Complaint  Patient presents with  . Follow-up    RCI, pt is not fasting.    HPI:    Patient is a 67 y.o.  male who presents for f/u hypothyroidism, IFG, CAD, tobacco dependence. I last saw him 02/2016 for his CPE.  He last saw his cardiologist 04/2017--lipid panel at that time was excellent. Was having some fatigue, which responded to decrease in lopressor dosing to 12.5mg  bid. Echo normal except mildly enlarged aortic root, f/u CTA chest/aorta showed same (no aneurism or dissection)--see details in PMH/PSH sections below.  Hypoth: takes thyroid med daily, morning on empty stomach w/out any other meds and waits an hour to eat.  IFG: not limiting diet at all.  We discussed this today.  Tob depend: smoking about a pack a day, [place] lights.  Smokes cig 30 min after getting up in morning.  He is considering using nicotine replacement method and we discussed details of this today.  Not interested in chantix today.   ROS: still some mild/intermittent fatigue.  no dizziness, polyuria, polydipsia, HA's, palpitations, SOB, CP, or LE swelling.  No melena or hematochezia.   Past Medical History:  Diagnosis Date  . Allergy   . Aortic root enlargement (HCC)    CTA chest/aorta 04/2017:  Mild enlargement of aortic root, measuring 3.9 x 3.8 x 3.6 cm.  . Arthralgia of both knees 09/2015   Plain films with minimal DJD changes medial compartment, o/w nl.  . Arthritis   . Claudication ([place])    Normal ABI's in 2006  . Coronary artery disease    s/p stents  . Fatigue due to treatment    better when lopressor dose was decreased to 12.5mg  bid 04/2017.  Marland Kitchen History of adenomatous polyp of colon 05/19/2016   Recall 04/2021 (Dr. Havery Moros)  . Hyperlipidemia   . Hypothyroidism   . IFG (impaired fasting glucose) 02/2016   HbA1c 6.1%  . Nephrolithiasis    passed one stone approx 2008; no prob since (saw urologist briefly)  . Normal nuclear stress test 2012  .  Prostatitis    When pt in 29s and 41s; saw Dr. Gaynelle Arabian and eventually got a TURP per pt's description.  No probs since then.  . Recurrent sinusitis    Sinus plain films 08/2014: bilat maxillary sinusitis (chronic)  . S/P coronary artery stent placement 2005   RCA and LCX    Past Surgical History:  Procedure Laterality Date  . APPENDECTOMY    . CARDIAC CATHETERIZATION  04/16/2003   NORMAL. EF 60%. TOTALLY OCCLUDED LEFT CIRCUMFLEX WITH SEVERE DISEASE, AND ULCERATED PLAQUE IN THE PROXIMAL RIGHT CORONARY ARTERY AND MILD ATHERSCLEROSIS IN THE LEFT ANTERIOR DESCENDING  . CARDIOVASCULAR STRESS TEST  2009;2012; 10/2014   2012 normal nuclear stress test.  2016 ETT no ischemia.  . COLONOSCOPY W/ POLYPECTOMY  05/19/2016   Multiple polyps--tubular adenoma.  Recall 5 yrs (04/2021).  . CORONARY STENT PLACEMENT  04/19/2003   STENT PLACEMENT IN THE LEFT CIRCUMFLEX CORONARY AND RIGHT CORONARY ARTERY. MILD RESIDUAL STENOSIS IN THE MID PORTION OF THE LEFT ANTERIOR DESCENDING  . TRANSTHORACIC ECHOCARDIOGRAM  05/03/2017   EF 60-65%, normal valves, mildly enlarged aortic root.    Outpatient Medications Prior to Visit  Medication Sig Dispense Refill  . aspirin 81 MG tablet Take 81 mg by mouth daily.      Marland Kitchen atorvastatin (LIPITOR) 80 MG tablet Take 1 tablet (80 mg total) by mouth daily. 90 tablet 3  .  Cyanocobalamin (VITAMIN B-12 PO) Take 1 tablet by mouth daily.    . fluticasone (FLONASE) 50 MCG/ACT nasal spray Place 2 sprays into both nostrils daily. 16 g 11  . levothyroxine (SYNTHROID, LEVOTHROID) 112 MCG tablet Take 1 tablet (112 mcg total) by mouth daily. Appointment needed for further refills 30 tablet 0  . metoprolol tartrate (LOPRESSOR) 25 MG tablet Take 0.5 tablets (12.5 mg total) by mouth 2 (two) times daily. 90 tablet 3  . montelukast (SINGULAIR) 10 MG tablet TAKE 1 TABLET BY MOUTH EVERYDAY AT BEDTIME 90 tablet 1  . doxycycline (VIBRA-TABS) 100 MG tablet Take 1 tablet (100 mg total) by mouth 2 (two)  times daily. (Patient not taking: Reported on 10/04/2017) 20 tablet 0  . predniSONE (DELTASONE) 20 MG tablet 60 mg x3d, 40 mg x3d, 20 mg x2d, 10 mg x2d (Patient not taking: Reported on 10/04/2017) 18 tablet 0   Facility-Administered Medications Prior to Visit  Medication Dose Route Frequency Provider Last Rate Last Dose  . 0.9 %  sodium chloride infusion  500 mL Intravenous Continuous Armbruster, Carlota Raspberry, MD        Allergies  Allergen Reactions  . Plavix [Clopidogrel Bisulfate] Rash    ROS As per HPI  PE: Blood pressure (!) 104/54, pulse 63, temperature 98.5 F (36.9 C), temperature source Oral, resp. rate 16, height 5\' 11"  (1.803 m), weight 192 lb 8 oz (87.3 kg), SpO2 96 %. Gen: Alert, well appearing.  Patient is oriented to person, place, time, and situation. AFFECT: pleasant, lucid thought and speech. CV: RRR, no m/r/g.   LUNGS: CTA bilat, nonlabored resps, good aeration in all lung fields. EXT: no clubbing, cyanosis, or edema.    LABS:  Lab Results  Component Value Date   TSH 3.19 05/27/2016   No results found for: WBC, HGB, HCT, MCV, PLT Lab Results  Component Value Date   CREATININE 0.92 05/12/2017   BUN 13 05/12/2017   NA 143 05/12/2017   K 4.2 05/12/2017   CL 104 05/12/2017   CO2 24 05/12/2017   Lab Results  Component Value Date   ALT 34 04/25/2017   AST 21 04/25/2017   ALKPHOS 126 (H) 04/25/2017   BILITOT 0.7 04/25/2017   Lab Results  Component Value Date   CHOL 129 04/25/2017   Lab Results  Component Value Date   HDL 40 04/25/2017   Lab Results  Component Value Date   LDLCALC 66 04/25/2017   Lab Results  Component Value Date   TRIG 116 04/25/2017   Lab Results  Component Value Date   CHOLHDL 3.2 04/25/2017   Lab Results  Component Value Date   PSA 1.34 03/18/2016   Lab Results  Component Value Date   HGBA1C 6.1 03/19/2016    IMPRESSION AND PLAN:  1) Hypothyroidism: taking T4 correctly. Due for TSH monitoring today.  2) IFG:  discussed changes in diet and increase in activity today. Recheck A1c today.  3) CAD: stable.  Continue ASA, BB, statin.  Continue routine cardiology f/u.  4) Tobacco dependence: pt wants to try quitting. Decided on nicotine replacement method. Instructions: Buy nicotine patches: 21 mg patch, apply one per day for 1 month, then change to 14mg  patch and apply one per day for 1 month, then change to 7 mg patch and apply 1 per day for 1 month.  Then no more nicotine patch. You can also use nicotine gum or lozenge : 1 of these up to 4 times per day as needed for  strong cravings. Do not smoke cigarettes while you are taking nicotine.  An After Visit Summary was printed and given to the patient.  FOLLOW UP: Return in about 6 months (around 04/06/2018) for annual CPE (fasting).  Signed:  Crissie Sickles, MD           10/04/2017

## 2017-11-15 ENCOUNTER — Telehealth: Payer: Self-pay | Admitting: Family Medicine

## 2017-11-15 NOTE — Telephone Encounter (Signed)
Copied from [place] [id number]. Topic: Quick Communication - See Telephone Encounter >> Nov 15, 2017  3:49 PM Gardiner Ramus wrote: CRM for notification. See Telephone encounter for: 11/15/17. Pt is about to be a grandfather and wated to know if he needed to get tdap vaccine. Pt would like to know if he needs to get any other vaccines. Please advise

## 2017-11-15 NOTE — Telephone Encounter (Signed)
I agree

## 2017-11-15 NOTE — Telephone Encounter (Signed)
Sw pt, advised him that he had a Tdap on 08/21/14, so he should be good for that, as for other vaccines looks like he would only need to consider a flu vaccine. Pt voiced understanding.

## 2017-12-10 ENCOUNTER — Other Ambulatory Visit: Payer: Self-pay | Admitting: Family Medicine

## 2017-12-10 DIAGNOSIS — J309 Allergic rhinitis, unspecified: Secondary | ICD-10-CM

## 2018-01-30 ENCOUNTER — Encounter: Payer: Self-pay | Admitting: Cardiovascular Disease

## 2018-02-20 ENCOUNTER — Encounter: Payer: Self-pay | Admitting: Cardiovascular Disease

## 2018-02-20 ENCOUNTER — Ambulatory Visit: Payer: PPO | Admitting: Cardiovascular Disease

## 2018-02-20 VITALS — BP 112/64 | HR 67 | Ht 71.0 in | Wt 198.4 lb

## 2018-02-20 DIAGNOSIS — Z720 Tobacco use: Secondary | ICD-10-CM | POA: Diagnosis not present

## 2018-02-20 DIAGNOSIS — E7800 Pure hypercholesterolemia, unspecified: Secondary | ICD-10-CM | POA: Diagnosis not present

## 2018-02-20 DIAGNOSIS — I2510 Atherosclerotic heart disease of native coronary artery without angina pectoris: Secondary | ICD-10-CM | POA: Diagnosis not present

## 2018-02-20 NOTE — Progress Notes (Signed)
Chief Complaint  Patient presents with  . Follow-up    CAD     History of Present Illness: 67 yo male with history of CAD, HLD, tobacco abuse and hypothyroidism here today for cardiac follow up. He had a NSTEMI in January 2005 and had a Cypher drug eluting stent placed in the occluded Circumflex and a Cypher drug eluting stent placed in the RCA. He has had no further caths since then. Stress myoview in July 2012 showed no ischemia. Exercise stress test August 2016 with no ischemia. Echo February 2019 with LVEF=60-65%, LVH. The aortic root was mildly dilated. Chest CTA with no evidence of aneurysm or dissection of the aorta.   He is here today for follow up. The patient denies any chest pain, dyspnea, palpitations, lower extremity edema, orthopnea, PND, dizziness, near syncope or syncope.   Primary Care Physician: Tammi Sou, MD  Past Medical History:  Diagnosis Date  . Allergy   . Aortic root enlargement (HCC)    CTA chest/aorta 04/2017:  Mild enlargement of aortic root, measuring 3.9 x 3.8 x 3.6 cm.  . Arthralgia of both knees 09/2015   Plain films with minimal DJD changes medial compartment, o/w nl.  . Arthritis   . Claudication ([place])    Normal ABI's in 2006  . Coronary artery disease    s/p stents  . Fatigue due to treatment    better when lopressor dose was decreased to 12.5mg  bid 04/2017.  Marland Kitchen History of adenomatous polyp of colon 05/19/2016   Recall 04/2021 (Dr. Havery Moros)  . Hyperlipidemia   . Hypothyroidism   . Nephrolithiasis    passed one stone approx 2008; no prob since (saw urologist briefly)  . Normal nuclear stress test 2012  . Prediabetes 02/2016   HbA1c 6.1%.  2019 A1c 6.3%.  . Prostatitis    When pt in 74s and 57s; saw Dr. Gaynelle Arabian and eventually got a TURP per pt's description.  No probs since then.  . Recurrent sinusitis    Sinus plain films 08/2014: bilat maxillary sinusitis (chronic)  . S/P coronary artery stent placement 2005   RCA and LCX     Past Surgical History:  Procedure Laterality Date  . APPENDECTOMY    . CARDIAC CATHETERIZATION  04/16/2003   NORMAL. EF 60%. TOTALLY OCCLUDED LEFT CIRCUMFLEX WITH SEVERE DISEASE, AND ULCERATED PLAQUE IN THE PROXIMAL RIGHT CORONARY ARTERY AND MILD ATHERSCLEROSIS IN THE LEFT ANTERIOR DESCENDING  . CARDIOVASCULAR STRESS TEST  2009;2012; 10/2014   2012 normal nuclear stress test.  2016 ETT no ischemia.  . COLONOSCOPY W/ POLYPECTOMY  05/19/2016   Multiple polyps--tubular adenoma.  Recall 5 yrs (04/2021).  . CORONARY STENT PLACEMENT  04/19/2003   STENT PLACEMENT IN THE LEFT CIRCUMFLEX CORONARY AND RIGHT CORONARY ARTERY. MILD RESIDUAL STENOSIS IN THE MID PORTION OF THE LEFT ANTERIOR DESCENDING  . TRANSTHORACIC ECHOCARDIOGRAM  05/03/2017   EF 60-65%, normal valves, mildly enlarged aortic root.    Current Outpatient Medications  Medication Sig Dispense Refill  . aspirin 81 MG tablet Take 81 mg by mouth daily.      Marland Kitchen atorvastatin (LIPITOR) 80 MG tablet Take 1 tablet (80 mg total) by mouth daily. 90 tablet 3  . Cyanocobalamin (VITAMIN B-12 PO) Take 1 tablet by mouth daily.    . fluticasone (FLONASE) 50 MCG/ACT nasal spray Place 2 sprays into both nostrils daily. 16 g 11  . levothyroxine (SYNTHROID, LEVOTHROID) 112 MCG tablet Take 1 tablet (112 mcg total) by mouth daily. 90 tablet  3  . metoprolol tartrate (LOPRESSOR) 25 MG tablet Take 0.5 tablets (12.5 mg total) by mouth 2 (two) times daily. 90 tablet 3  . montelukast (SINGULAIR) 10 MG tablet TAKE 1 TABLET BY MOUTH EVERYDAY AT BEDTIME 90 tablet 1   Current Facility-Administered Medications  Medication Dose Route Frequency Provider Last Rate Last Dose  . 0.9 %  sodium chloride infusion  500 mL Intravenous Continuous Armbruster, Carlota Raspberry, MD        Allergies  Allergen Reactions  . Plavix [Clopidogrel Bisulfate] Rash    Social History   Socioeconomic History  . Marital status: Married    Spouse name: Not on file  . Number of children: 2   . Years of education: Not on file  . Highest education level: Not on file  Occupational History  . Occupation: Press photographer Rep  Social Needs  . Financial resource strain: Not on file  . Food insecurity:    Worry: Not on file    Inability: Not on file  . Transportation needs:    Medical: Not on file    Non-medical: Not on file  Tobacco Use  . Smoking status: Current Every Day Smoker    Packs/day: 1.00    Years: 30.00    Pack years: 30.00  . Smokeless tobacco: Never Used  Substance and Sexual Activity  . Alcohol use: No  . Drug use: No  . Sexual activity: Yes  Lifestyle  . Physical activity:    Days per week: Not on file    Minutes per session: Not on file  . Stress: Not on file  Relationships  . Social connections:    Talks on phone: Not on file    Gets together: Not on file    Attends religious service: Not on file    Active member of club or organization: Not on file    Attends meetings of clubs or organizations: Not on file    Relationship status: Not on file  . Intimate partner violence:    Fear of current or ex partner: Not on file    Emotionally abused: Not on file    Physically abused: Not on file    Forced sexual activity: Not on file  Other Topics Concern  . Not on file  Social History Narrative   Married, 1 son in [place] and one in [place].   Lives in [place].   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   Tob: 40 pack-yr hx, current as of 04/2014.   Alcohol: social/rare.    Family History  Problem Relation Age of Onset  . Hypertension Mother   . Stroke Mother   . Hypertension Father   . Stroke Father   . Prostate cancer Father   . Heart attack Brother        STENT  . Colon cancer Neg Hx     Review of Systems:  As stated in the HPI and otherwise negative.   BP 112/64   Pulse 67   Ht 5\' 11"  (1.803 m)   Wt 198 lb 6.4 oz (90 kg)   SpO2 98%   BMI 27.67 kg/m   Physical  Examination: General: Well developed, well nourished, NAD  HEENT: OP clear, mucus membranes moist  SKIN: warm, dry. No rashes. Neuro: No focal deficits  Musculoskeletal: Muscle strength 5/5 all ext  Psychiatric: Mood and affect normal  Neck: No JVD, no carotid bruits, no thyromegaly, no lymphadenopathy.  Lungs:Clear  bilaterally, no wheezes, rhonci, crackles Cardiovascular: Regular rate and rhythm. No murmurs, gallops or rubs. Abdomen:Soft. Bowel sounds present. Non-tender.  Extremities: No lower extremity edema. Pulses are 2 + in the bilateral DP/PT.  Echo February 2019: Left ventricle: The cavity size was normal. There was severe   focal basal and moderate concentric hypertrophy. Systolic   function was normal. The estimated ejection fraction was in the   range of 60% to 65%. Wall motion was normal; there were no   regional wall motion abnormalities. Left ventricular diastolic   function parameters were normal. - Aortic valve: Moderate focal calcification involving the   noncoronary cusp. - Aorta: Aortic root dimension: 42 mm (ED). Ascending aortic   diameter: 39 mm (S). - Aortic root: The aortic root was mildly dilated. - Ascending aorta: The ascending aorta was mildly dilated. - Left atrium: The atrium was moderately dilated.  EKG:  EKG is  ordered today. The ekg ordered today demonstrates  NSR, rate 67 bpm.   Recent Labs: 04/25/2017: ALT 34 10/04/2017: BUN 14; Creatinine, Ser 0.98; Potassium 4.3; Sodium 142; TSH 0.46   Lipid Panel    Component Value Date/Time   CHOL 129 04/25/2017 1008   TRIG 116 04/25/2017 1008   HDL 40 04/25/2017 1008   CHOLHDL 3.2 04/25/2017 1008   CHOLHDL 3.7 01/16/2016 0848   VLDL 25 01/16/2016 0848   LDLCALC 66 04/25/2017 1008     Wt Readings from Last 3 Encounters:  02/20/18 198 lb 6.4 oz (90 kg)  10/04/17 192 lb 8 oz (87.3 kg)  08/02/17 199 lb 8 oz (90.5 kg)     Other studies Reviewed: Additional studies/ records that were reviewed today  include: . Review of the above records demonstrates:    Assessment and Plan:   1. CAD without angina: LV function normal by echo in February 2019. No chest pain. Continue ASA, statin and beta blocker. He did not have chest pain before his last stents. Will update  Exercise treadmill stress test.   2. Tobacco abuse: Smoking cessation is advised.   3. Hyperlipidemia: LDL at goal this year. Continue statin. Repeat lipids and LFTs in February 2020.     Current medicines are reviewed at length with the patient today.  The patient does not have concerns regarding medicines.  The following changes have been made:  no change  Labs/ tests ordered today include:   Orders Placed This Encounter  Procedures  . Lipid Profile  . Hepatic function panel  . EXERCISE TOLERANCE TEST (ETT)  . EKG 12-Lead    Disposition:   FU with me in 12 months  Signed, Lauree Chandler, MD 02/20/2018 12:07 PM    [address], [place], [place]  [address] Phone: [phone]; Fax: [phone]

## 2018-02-20 NOTE — Patient Instructions (Signed)
Medication Instructions:  Your physician recommends that you continue on your current medications as directed. Please refer to the Current Medication list given to you today.  If you need a refill on your cardiac medications before your next appointment, please call your pharmacy.   Lab work: Your physician recommends that you return for lab work in: February 2020.  Lipid and liver profiles.  This will be fasting.  The lab opens at 7:30 AM  If you have labs (blood work) drawn today and your tests are completely normal, you will receive your results only by: Marland Kitchen MyChart Message (if you have MyChart) OR . A paper copy in the mail If you have any lab test that is abnormal or we need to change your treatment, we will call you to review the results.  Testing/Procedures: Your physician has requested that you have an exercise tolerance test. For further information please visit [email or web address]. Please also follow instruction sheet, as given.    Follow-Up: At [facility], you and your health needs are our priority.  As part of our continuing mission to provide you with exceptional heart care, we have created designated Provider Care Teams.  These Care Teams include your primary Cardiologist (physician) and Advanced Practice Providers (APPs -  Physician Assistants and Nurse Practitioners) who all work together to provide you with the care you need, when you need it. You will need a follow up appointment in 12 months.  Please call our office 2 months in advance to schedule this appointment.  You may see Lauree Chandler, MD or one of the following Advanced Practice Providers on your designated Care Team:   [place], PA-C Melina Copa, PA-C . Ermalinda Barrios, PA-C  Any Other Special Instructions Will Be Listed Below (If Applicable).

## 2018-02-23 ENCOUNTER — Encounter: Payer: Self-pay | Admitting: Family Medicine

## 2018-03-28 ENCOUNTER — Ambulatory Visit (INDEPENDENT_AMBULATORY_CARE_PROVIDER_SITE_OTHER): Payer: PPO

## 2018-03-28 DIAGNOSIS — I2510 Atherosclerotic heart disease of native coronary artery without angina pectoris: Secondary | ICD-10-CM

## 2018-03-28 LAB — EXERCISE TOLERANCE TEST
CHL CUP RESTING HR STRESS: 86 {beats}/min
Estimated workload: 7 METS
Exercise duration (min): 6 min
Exercise duration (sec): 0 s
MPHR: 151 {beats}/min
Peak HR: 137 {beats}/min
Percent HR: 90 %
RPE: 16

## 2018-04-25 ENCOUNTER — Other Ambulatory Visit (INDEPENDENT_AMBULATORY_CARE_PROVIDER_SITE_OTHER): Payer: PPO

## 2018-04-25 DIAGNOSIS — I2510 Atherosclerotic heart disease of native coronary artery without angina pectoris: Secondary | ICD-10-CM

## 2018-04-25 DIAGNOSIS — E7800 Pure hypercholesterolemia, unspecified: Secondary | ICD-10-CM

## 2018-04-25 LAB — HEPATIC FUNCTION PANEL
ALT: 26 IU/L (ref 0–44)
AST: 21 IU/L (ref 0–40)
Albumin: 4.3 g/dL (ref 3.8–4.8)
Alkaline Phosphatase: 124 IU/L — ABNORMAL HIGH (ref 39–117)
Bilirubin Total: 0.8 mg/dL (ref 0.0–1.2)
Bilirubin, Direct: 0.2 mg/dL (ref 0.00–0.40)
Total Protein: 6.6 g/dL (ref 6.0–8.5)

## 2018-04-25 LAB — LIPID PANEL
Chol/HDL Ratio: 3.3 ratio (ref 0.0–5.0)
Cholesterol, Total: 129 mg/dL (ref 100–199)
HDL: 39 mg/dL — AB (ref >39)
LDL Calculated: 71 mg/dL (ref 0–99)
Triglycerides: 96 mg/dL (ref 0–149)
VLDL Cholesterol Cal: 19 mg/dL (ref 5–40)

## 2018-05-21 ENCOUNTER — Other Ambulatory Visit: Payer: Self-pay | Admitting: Cardiovascular Disease

## 2018-05-21 DIAGNOSIS — E7800 Pure hypercholesterolemia, unspecified: Secondary | ICD-10-CM

## 2018-06-11 ENCOUNTER — Other Ambulatory Visit: Payer: Self-pay | Admitting: Family Medicine

## 2018-06-11 DIAGNOSIS — J309 Allergic rhinitis, unspecified: Secondary | ICD-10-CM

## 2018-06-29 ENCOUNTER — Other Ambulatory Visit: Payer: Self-pay | Admitting: Cardiovascular Disease

## 2018-07-16 ENCOUNTER — Other Ambulatory Visit: Payer: Self-pay | Admitting: Family Medicine

## 2018-07-24 ENCOUNTER — Ambulatory Visit: Payer: PPO | Admitting: Family Medicine

## 2018-07-26 ENCOUNTER — Ambulatory Visit (INDEPENDENT_AMBULATORY_CARE_PROVIDER_SITE_OTHER): Payer: PPO | Admitting: Family Medicine

## 2018-07-26 ENCOUNTER — Encounter: Payer: Self-pay | Admitting: Family Medicine

## 2018-07-26 ENCOUNTER — Other Ambulatory Visit: Payer: Self-pay

## 2018-07-26 VITALS — BP 106/56 | HR 65

## 2018-07-26 DIAGNOSIS — I2510 Atherosclerotic heart disease of native coronary artery without angina pectoris: Secondary | ICD-10-CM

## 2018-07-26 DIAGNOSIS — R7303 Prediabetes: Secondary | ICD-10-CM

## 2018-07-26 DIAGNOSIS — E039 Hypothyroidism, unspecified: Secondary | ICD-10-CM | POA: Diagnosis not present

## 2018-07-26 DIAGNOSIS — F17200 Nicotine dependence, unspecified, uncomplicated: Secondary | ICD-10-CM

## 2018-07-26 NOTE — Progress Notes (Signed)
Virtual Visit via Video Note  I connected with pt on 07/26/18 at 10:20 AM EDT by a video enabled telemedicine application and verified that I am speaking with the correct person using two identifiers.  Location patient: home Location provider:work or home office Persons participating in the virtual visit: patient, provider  I discussed the limitations of evaluation and management by telemedicine and the availability of in person appointments. The patient expressed understanding and agreed to proceed.  Telemedicine visit is a necessity given the COVID-19 restrictions in place at the current time.  HPI: 68 y/o WM being seen today for f/u hypothyroidism, IFG, CAD, and tobacco dependence.  Hypoth: takes levothyr on empty stomach w/out any other meds at the same time.  IFG/prediabetes: he has cut back on sweets some. No exercise.  CAD:  No CP, SOB, DOE, palpitations, arm pain, or jaw pain.  Tob dependence: we discussed nicotine replacement method at length last visit. He did not fill my rx for nicotine patches.  He has cut back some but not decided to completely quit at this time.  ROS: no CP, no SOB, no wheezing, no cough, no dizziness, no HAs, no rashes, no melena/hematochezia.  No polyuria or polydipsia.  No myalgias or arthralgias.   Past Medical History:  Diagnosis Date  . Allergy   . Aortic root enlargement (HCC)    CTA chest/aorta 04/2017:  Mild enlargement of aortic root, measuring 3.9 x 3.8 x 3.6 cm.  . Arthralgia of both knees 09/2015   Plain films with minimal DJD changes medial compartment, o/w nl.  . Arthritis   . Claudication ([place])    Normal ABI's in 2006  . Coronary artery disease    s/p stents  . Fatigue due to treatment    better when lopressor dose was decreased to 12.5mg  bid 04/2017.  Marland Kitchen History of adenomatous polyp of colon 05/19/2016   Recall 04/2021 (Dr. Havery Moros)  . Hyperlipidemia   . Hypothyroidism   . Nephrolithiasis    passed one stone approx 2008; no  prob since (saw urologist briefly)  . Normal nuclear stress test 2012  . Prediabetes 02/2016   HbA1c 6.1%.  2019 A1c 6.3%.  . Prostatitis    When pt in 62s and 47s; saw Dr. Gaynelle Arabian and eventually got a TURP per pt's description.  No probs since then.  . Recurrent sinusitis    Sinus plain films 08/2014: bilat maxillary sinusitis (chronic)  . S/P coronary artery stent placement 2005   RCA and LCX.  As of 02/2018 cardiology f/u, plan is to get ETT (pt doing fine, but was not having chest pain prior to last stent placement).  . Tobacco dependence    current as of 02/2018    Past Surgical History:  Procedure Laterality Date  . APPENDECTOMY    . CARDIAC CATHETERIZATION  04/16/2003   NORMAL. EF 60%. TOTALLY OCCLUDED LEFT CIRCUMFLEX WITH SEVERE DISEASE, AND ULCERATED PLAQUE IN THE PROXIMAL RIGHT CORONARY ARTERY AND MILD ATHERSCLEROSIS IN THE LEFT ANTERIOR DESCENDING  . CARDIOVASCULAR STRESS TEST  2009;2012; 10/2014   2012 normal nuclear stress test.  2016 ETT no ischemia.  . COLONOSCOPY W/ POLYPECTOMY  05/19/2016   Multiple polyps--tubular adenoma.  Recall 5 yrs (04/2021).  . CORONARY STENT PLACEMENT  04/19/2003   STENT PLACEMENT IN THE LEFT CIRCUMFLEX CORONARY AND RIGHT CORONARY ARTERY. MILD RESIDUAL STENOSIS IN THE MID PORTION OF THE LEFT ANTERIOR DESCENDING  . TRANSTHORACIC ECHOCARDIOGRAM  05/03/2017   EF 60-65%, normal valves, mildly enlarged aortic root.  Family History  Problem Relation Age of Onset  . Hypertension Mother   . Stroke Mother   . Hypertension Father   . Stroke Father   . Prostate cancer Father   . Heart attack Brother        STENT  . Colon cancer Neg Hx      Current Outpatient Medications:  .  aspirin 81 MG tablet, Take 81 mg by mouth daily.  , Disp: , Rfl:  .  atorvastatin (LIPITOR) 80 MG tablet, TAKE 1 TABLET BY MOUTH EVERY DAY, Disp: 90 tablet, Rfl: 2 .  Cyanocobalamin (VITAMIN B-12 PO), Take 1 tablet by mouth daily., Disp: , Rfl:  .  fluticasone (FLONASE)  50 MCG/ACT nasal spray, Place 2 sprays into both nostrils daily., Disp: 16 g, Rfl: 11 .  levothyroxine (SYNTHROID) 112 MCG tablet, TAKE 1 TABLET BY MOUTH EVERY DAY, Disp: 30 tablet, Rfl: 0 .  metoprolol tartrate (LOPRESSOR) 25 MG tablet, TAKE 1 TABLET BY MOUTH TWICE A DAY, Disp: 180 tablet, Rfl: 3 .  montelukast (SINGULAIR) 10 MG tablet, TAKE 1 TABLET BY MOUTH EVERYDAY AT BEDTIME, Disp: 90 tablet, Rfl: 0  Current Facility-Administered Medications:  .  0.9 %  sodium chloride infusion, 500 mL, Intravenous, Continuous, Armbruster, Carlota Raspberry, MD  EXAM:  VITALS per patient if applicable: BP (!) 423/53 (BP Location: Right Arm, Patient Position: Sitting, Cuff Size: Normal)   Pulse 65    GENERAL: alert, oriented, appears well and in no acute distress  HEENT: atraumatic, conjunttiva clear, no obvious abnormalities on inspection of external nose and ears  NECK: normal movements of the head and neck  LUNGS: on inspection no signs of respiratory distress, breathing rate appears normal, no obvious gross SOB, gasping or wheezing  CV: no obvious cyanosis  MS: moves all visible extremities without noticeable abnormality  PSYCH/NEURO: pleasant and cooperative, no obvious depression or anxiety, speech and thought processing grossly intact  LABS: none today    Chemistry      Component Value Date/Time   NA 142 10/04/2017 1151   NA 143 05/12/2017 0939   K 4.3 10/04/2017 1151   CL 106 10/04/2017 1151   CO2 28 10/04/2017 1151   BUN 14 10/04/2017 1151   BUN 13 05/12/2017 0939   CREATININE 0.98 10/04/2017 1151      Component Value Date/Time   CALCIUM 9.8 10/04/2017 1151   ALKPHOS 124 (H) 04/25/2018 0820   AST 21 04/25/2018 0820   ALT 26 04/25/2018 0820   BILITOT 0.8 04/25/2018 0820     No results found for: WBC, HGB, HCT, MCV, PLT Lab Results  Component Value Date   CHOL 129 04/25/2018   HDL 39 (L) 04/25/2018   LDLCALC 71 04/25/2018   TRIG 96 04/25/2018   CHOLHDL 3.3 04/25/2018   Lab  Results  Component Value Date   TSH 0.46 10/04/2017   Lab Results  Component Value Date   HGBA1C 6.3 10/04/2017    ASSESSMENT AND PLAN:  Discussed the following assessment and plan:  1) Hypothyroidism: The current medical regimen is effective;  continue present plan and medications. TSH monitoring--future order placed.  2) IFG: has cut back on simple sugars but o/w no change in diet and no exercise. HbA1c--future. BMET--future.  3) Tobacco dependence: cut back but not ready to completely quit. Encouraged complete cessation.  4) CAD: asymptomatic.  Continue ASA, statin, BB. Needs to quit smoking.  Following Hba1c.  I discussed the assessment and treatment plan with the patient. The patient  was provided an opportunity to ask questions and all were answered. The patient agreed with the plan and demonstrated an understanding of the instructions.   The patient was advised to call back or seek an in-person evaluation if the symptoms worsen or if the condition fails to improve as anticipated.  F/u: 6 mo CPE  Signed:  Crissie Sickles, MD           07/27/2018

## 2018-08-07 ENCOUNTER — Other Ambulatory Visit (INDEPENDENT_AMBULATORY_CARE_PROVIDER_SITE_OTHER): Payer: PPO

## 2018-08-07 ENCOUNTER — Other Ambulatory Visit: Payer: Self-pay

## 2018-08-07 DIAGNOSIS — E039 Hypothyroidism, unspecified: Secondary | ICD-10-CM

## 2018-08-07 DIAGNOSIS — I2510 Atherosclerotic heart disease of native coronary artery without angina pectoris: Secondary | ICD-10-CM | POA: Diagnosis not present

## 2018-08-07 DIAGNOSIS — R7303 Prediabetes: Secondary | ICD-10-CM

## 2018-08-07 LAB — BASIC METABOLIC PANEL
BUN: 15 mg/dL (ref 6–23)
CO2: 27 mEq/L (ref 19–32)
Calcium: 9.1 mg/dL (ref 8.4–10.5)
Chloride: 104 mEq/L (ref 96–112)
Creatinine, Ser: 1.01 mg/dL (ref 0.40–1.50)
GFR: 73.15 mL/min (ref >60.00)
Glucose, Bld: 150 mg/dL — ABNORMAL HIGH (ref 70–99)
Potassium: 4.2 mEq/L (ref 3.5–5.1)
Sodium: 140 mEq/L (ref 135–145)

## 2018-08-07 LAB — HEMOGLOBIN A1C: Hgb A1c MFr Bld: 6.2 % (ref 4.6–6.5)

## 2018-08-07 LAB — TSH: TSH: 1.85 u[IU]/mL (ref 0.35–4.50)

## 2018-08-08 ENCOUNTER — Other Ambulatory Visit: Payer: Self-pay

## 2018-08-08 MED ORDER — METFORMIN HCL 500 MG PO TABS: 500.0000 mg | ORAL_TABLET | Freq: Every day | ORAL | 3 refills | Status: DC

## 2018-08-21 DIAGNOSIS — I77811 Abdominal aortic ectasia: Secondary | ICD-10-CM

## 2018-08-21 HISTORY — DX: Abdominal aortic ectasia: I77.811

## 2018-09-03 ENCOUNTER — Encounter (HOSPITAL_BASED_OUTPATIENT_CLINIC_OR_DEPARTMENT_OTHER): Payer: Self-pay | Admitting: *Deleted

## 2018-09-03 ENCOUNTER — Other Ambulatory Visit: Payer: Self-pay

## 2018-09-03 ENCOUNTER — Emergency Department (HOSPITAL_BASED_OUTPATIENT_CLINIC_OR_DEPARTMENT_OTHER): Payer: PPO

## 2018-09-03 ENCOUNTER — Emergency Department (HOSPITAL_BASED_OUTPATIENT_CLINIC_OR_DEPARTMENT_OTHER)
Admission: EM | Admit: 2018-09-03 | Discharge: 2018-09-03 | Disposition: A | Discharge status: Home / Self Care | Payer: PPO | Attending: Emergency Medicine | Admitting: Emergency Medicine

## 2018-09-03 DIAGNOSIS — E785 Hyperlipidemia, unspecified: Secondary | ICD-10-CM | POA: Insufficient documentation

## 2018-09-03 DIAGNOSIS — Z79899 Other long term (current) drug therapy: Secondary | ICD-10-CM | POA: Diagnosis not present

## 2018-09-03 DIAGNOSIS — M545 Low back pain: Secondary | ICD-10-CM | POA: Diagnosis not present

## 2018-09-03 DIAGNOSIS — M5442 Lumbago with sciatica, left side: Secondary | ICD-10-CM | POA: Insufficient documentation

## 2018-09-03 DIAGNOSIS — F17210 Nicotine dependence, cigarettes, uncomplicated: Secondary | ICD-10-CM | POA: Insufficient documentation

## 2018-09-03 DIAGNOSIS — I2510 Atherosclerotic heart disease of native coronary artery without angina pectoris: Secondary | ICD-10-CM | POA: Insufficient documentation

## 2018-09-03 LAB — URINALYSIS, ROUTINE W REFLEX MICROSCOPIC
Bilirubin Urine: NEGATIVE
Glucose, UA: NEGATIVE mg/dL
Hgb urine dipstick: NEGATIVE
Ketones, ur: NEGATIVE mg/dL
Leukocytes,Ua: NEGATIVE
Nitrite: NEGATIVE
Protein, ur: NEGATIVE mg/dL
Specific Gravity, Urine: 1.015 (ref 1.005–1.030)
pH: 6 (ref 5.0–8.0)

## 2018-09-03 MED ORDER — HYDROCODONE-ACETAMINOPHEN 5-325 MG PO TABS
1.0000 | ORAL_TABLET | Freq: Once | ORAL | Status: AC
  Administered 2018-09-03: 1 via ORAL
  Filled 2018-09-03: qty 1

## 2018-09-03 MED ORDER — HYDROCODONE-ACETAMINOPHEN 5-325 MG PO TABS: 1.0000 | ORAL_TABLET | ORAL | 0 refills | Status: DC | PRN

## 2018-09-03 NOTE — ED Notes (Signed)
Pt verbalized understanding need to have a ride when discharged r/t receiving pain medication.

## 2018-09-03 NOTE — ED Provider Notes (Signed)
[facility]ERG[facility] DEPART[facility] Provider Note   CSN: [id number] Arrival date & time: 09/03/18  1943    History   Chief Complaint Chief Complaint  Patient presents with  . Back Pain    HPI Lawrence Hunter is a 68 y.o. male.     HPI  68 year old male presents with acute left lower back pain.  Started yesterday afternoon when he got out of the truck after going fishing.  Pain radiates down the lateral aspect of his leg into his ankle.  No numbness or weakness.  No incontinence or fever.  When moving or walking the pain can get pretty bad but at rest is about a 2 out of 10.  He took Tylenol yesterday.  Has not take anything today but did apply a patch.  It is in a similar area to when he had a kidney stone but that did not radiate down his leg.  He has not noticed any urinary symptoms or hematuria.  Past Medical History:  Diagnosis Date  . Allergy   . Aortic root enlargement (HCC)    CTA chest/aorta 04/2017:  Mild enlargement of aortic root, measuring 3.9 x 3.8 x 3.6 cm.  . Arthralgia of both knees 09/2015   Plain films with minimal DJD changes medial compartment, o/w nl.  . Arthritis   . Claudication ([place])    Normal ABI's in 2006  . Coronary artery disease    s/p stents  . Fatigue due to treatment    better when lopressor dose was decreased to 12.5mg  bid 04/2017.  Marland Kitchen History of adenomatous polyp of colon 05/19/2016   Recall 04/2021 (Dr. Havery Moros)  . Hyperlipidemia   . Hypothyroidism   . Nephrolithiasis    passed one stone approx 2008; no prob since (saw urologist briefly)  . Normal nuclear stress test 2012  . Prediabetes 02/2016   HbA1c 6.1%.  2019 A1c 6.3%.  . Prostatitis    When pt in 81s and 79s; saw Dr. Gaynelle Arabian and eventually got a TURP per pt's description.  No probs since then.  . Recurrent sinusitis    Sinus plain films 08/2014: bilat maxillary sinusitis (chronic)  . S/P coronary artery stent placement 2005   RCA and LCX.  As of 02/2018 cardiology  f/u, plan is to get ETT (pt doing fine, but was not having chest pain prior to last stent placement).  . Tobacco dependence    current as of 02/2018    Patient Active Problem List   Diagnosis Date Noted  . Aortic root enlargement ([place]) 05/05/2017  . Allergic rhinitis 02/14/2015  . Hypothyroidism   . S/P coronary artery stent placement   . Claudication ([place])   . Nephrolithiasis   . Prostatitis   . Colon cancer screening 04/23/2014  . CAD (coronary artery disease) 10/09/2010  . Tobacco abuse 10/09/2010  . Hyperlipidemia 10/09/2010    Past Surgical History:  Procedure Laterality Date  . APPENDECTOMY    . CARDIAC CATHETERIZATION  04/16/2003   NORMAL. EF 60%. TOTALLY OCCLUDED LEFT CIRCUMFLEX WITH SEVERE DISEASE, AND ULCERATED PLAQUE IN THE PROXIMAL RIGHT CORONARY ARTERY AND MILD ATHERSCLEROSIS IN THE LEFT ANTERIOR DESCENDING  . CARDIOVASCULAR STRESS TEST  2009;2012; 10/2014; 03/2018   2012 normal nuclear stress test.  2016 ETT no ischemia.  ETT normal 03/2018.  Marland Kitchen COLONOSCOPY W/ POLYPECTOMY  05/19/2016   Multiple polyps--tubular adenoma.  Recall 5 yrs (04/2021).  . CORONARY STENT PLACEMENT  04/19/2003   STENT PLACEMENT IN THE LEFT CIRCUMFLEX CORONARY AND  RIGHT CORONARY ARTERY. MILD RESIDUAL STENOSIS IN THE MID PORTION OF THE LEFT ANTERIOR DESCENDING  . TRANSTHORACIC ECHOCARDIOGRAM  05/03/2017   EF 60-65%, normal valves, mildly enlarged aortic root.        Home Medications    Prior to Admission medications   Medication Sig Start Date End Date Taking? Authorizing Provider  aspirin 81 MG tablet Take 81 mg by mouth daily.      [provider]  atorvastatin (LIPITOR) 80 MG tablet TAKE 1 TABLET BY MOUTH EVERY DAY 05/22/18   Burnell Blanks, MD  Cyanocobalamin (VITAMIN B-12 PO) Take 1 tablet by mouth daily.    [provider]  fluticasone (FLONASE) 50 MCG/ACT nasal spray Place 2 sprays into both nostrils daily. 04/23/14   McGowen, Adrian Blackwater, MD  levothyroxine  (SYNTHROID) 112 MCG tablet TAKE 1 TABLET BY MOUTH EVERY DAY 07/17/18   McGowen, Adrian Blackwater, MD  metFORMIN (GLUCOPHAGE) 500 MG tablet Take 1 tablet (500 mg total) by mouth daily with supper. 08/08/18   McGowen, Adrian Blackwater, MD  metoprolol tartrate (LOPRESSOR) 25 MG tablet TAKE 1 TABLET BY MOUTH TWICE A DAY 06/29/18   Burnell Blanks, MD  montelukast (SINGULAIR) 10 MG tablet TAKE 1 TABLET BY MOUTH EVERYDAY AT BEDTIME 06/12/18   McGowen, Adrian Blackwater, MD    Family History Family History  Problem Relation Age of Onset  . Hypertension Mother   . Stroke Mother   . Hypertension Father   . Stroke Father   . Prostate cancer Father   . Heart attack Brother        STENT  . Colon cancer Neg Hx     Social History Social History   Tobacco Use  . Smoking status: Current Every Day Smoker    Packs/day: 1.00    Years: 30.00    Pack years: 30.00    Types: Cigarettes  . Smokeless tobacco: Never Used  Substance Use Topics  . Alcohol use: No  . Drug use: No     Allergies   Plavix [clopidogrel bisulfate]   Review of Systems Review of Systems  Constitutional: Negative for fever.  Gastrointestinal: Negative for abdominal pain.  Genitourinary: Negative for dysuria and hematuria.  Musculoskeletal: Positive for back pain.  Neurological: Negative for weakness and numbness.  All other systems reviewed and are negative.    Physical Exam Updated Vital Signs BP 116/66 (BP Location: Right Arm)   Pulse 67   Temp 98.4 F (36.9 C) (Oral)   Resp 16   Ht 5\' 11"  (1.803 m)   Wt 89.8 kg   SpO2 99%   BMI 27.62 kg/m   Physical Exam Vitals signs and nursing note reviewed.  Constitutional:      Appearance: He is well-developed.  HENT:     Head: Normocephalic and atraumatic.     Right Ear: External ear normal.     Left Ear: External ear normal.     Nose: Nose normal.  Eyes:     General:        Right eye: No discharge.        Left eye: No discharge.  Neck:     Musculoskeletal: Neck supple.   Cardiovascular:     Rate and Rhythm: Normal rate and regular rhythm.     Heart sounds: Normal heart sounds.  Pulmonary:     Effort: Pulmonary effort is normal.     Breath sounds: Normal breath sounds.  Abdominal:     General: There is no distension.  Palpations: Abdomen is soft.     Tenderness: There is no abdominal tenderness.  Musculoskeletal:     Lumbar back: He exhibits no tenderness and no bony tenderness.       Back:  Skin:    General: Skin is warm and dry.  Neurological:     Mental Status: He is alert.     Comments: 5/5 strength in BLE. Normal gross sensation  Psychiatric:        Mood and Affect: Mood is not anxious.      ED Treatments / Results  Labs (all labs ordered are listed, but only abnormal results are displayed) Labs Reviewed  URINALYSIS, ROUTINE W REFLEX MICROSCOPIC    EKG None  Radiology Dg Lumbar Spine Complete  Result Date: 09/03/2018 CLINICAL DATA:  Low back pain radiating into the left leg EXAM: LUMBAR SPINE - COMPLETE 4+ VIEW COMPARISON:  None. FINDINGS: Degenerative facet disease throughout the lumbar spine. Disc spaces are maintained. Early anterior degenerative spurring. No fracture or subluxation. Aortoiliac atherosclerosis. No visible aneurysm. IMPRESSION: Degenerative changes, most notable in the facets. No acute bony abnormality. Aortic atherosclerosis. Electronically Signed   By: Rolm Baptise M.D.   On: 09/03/2018 21:14    Procedures Ultrasound ED Abd  Date/Time: 09/03/2018 9:35 PM Performed by: Sherwood Gambler, MD Authorized by: Sherwood Gambler, MD   Procedure details:    Indications: back pain     Assessment for:  AAA   Aorta:  Visualized       Vascular findings:    Aorta: aorta normal (< 3cm)     Aorta diameter:  2.95 cm Comments:     Maximum diameter 2.95 cm in transverse orientation.    (including critical care time)  Medications Ordered in ED Medications  HYDROcodone-acetaminophen (NORCO/VICODIN) 5-325 MG per  tablet 1 tablet (1 tablet Oral Given 09/03/18 2141)     Initial Impression / Assessment and Plan / ED Course  I have reviewed the triage vital signs and the nursing notes.  Pertinent labs & imaging results that were available during my care of the patient were reviewed by me and considered in my medical decision making (see chart for details).        Patient's pain appears to be from sciatica.  He is in a decent amount of pain and will be given hydrocodone.  No blood in his urine and with the radicular symptoms down the leg it does not sound like a stone.  I did do a bedside ultrasound and there is no obvious aneurysm.  His aorta diameter is technically less than 3 though it is getting close.  I discussed he will need an outpatient ultrasound to follow-up but I do not think this is AAA and do not think he needs an emergent CT.  Will treat with hydrocodone and at this point he will need outpatient follow-up.  No emergent indication per MRI.  Final Clinical Impressions(s) / ED Diagnoses   Final diagnoses:  None    ED Discharge Orders    None       Sherwood Gambler, MD 09/03/18 2228

## 2018-09-03 NOTE — Discharge Instructions (Signed)
If you develop worsening, recurrent, or continued back pain, numbness or weakness in the legs, incontinence of your bowels or bladders, numbness of your buttocks, fever, abdominal pain, or any other new/concerning symptoms then return to the ER for evaluation.   The ultrasound of your abdomen today did not show an abdominal aortic aneurysm but your numbers were close to 3 cm.  You will need to have this repeated by your primary care physician.

## 2018-09-03 NOTE — ED Notes (Signed)
Pt states pain improved when he sits in chair.

## 2018-09-03 NOTE — ED Notes (Addendum)
Pt sitting in chair in room. RN asked if his pain had improved. Pt replied "no, not really but as long as I'm sitting like this I'm fine."

## 2018-09-03 NOTE — ED Triage Notes (Signed)
Pt reports he went fishing yesterday. Today he has pain in his low back radiating down his left leg. Denies specific injury

## 2018-09-04 ENCOUNTER — Other Ambulatory Visit: Payer: Self-pay | Admitting: Family Medicine

## 2018-09-04 DIAGNOSIS — J309 Allergic rhinitis, unspecified: Secondary | ICD-10-CM

## 2018-09-05 ENCOUNTER — Telehealth: Payer: Self-pay | Admitting: Family Medicine

## 2018-09-05 DIAGNOSIS — I77811 Abdominal aortic ectasia: Secondary | ICD-10-CM

## 2018-09-05 NOTE — Telephone Encounter (Signed)
Spoke with patients wife, Olin Hauser.   Patient was seen in ER for sciatica pain, prescribed Norco. Wife reports patient still in pain and unable to sleep.  Bedside US showed aorta diameter of 2.95cm, ER provider recommends outpatient [place], not emergent for CT/MRI. Patients wife is requesting radiology studies to be ordered. Offered appt with PCP on Thursday 09/07/2018, wife declines stating that's too far out. Offered appt with other [facility] provider today, wife prefers studies ordered/completed prior to appt.  Please advise.

## 2018-09-05 NOTE — Telephone Encounter (Signed)
I reviewed the ED MD's note from 09/03/18. Aortic ultrasound ordered for [facility].  We'll call with results and decide about follow up at that time.-thx

## 2018-09-05 NOTE — Telephone Encounter (Signed)
Patient is requesting radiology order per hospitalist. Patient wasn't sure if the hospitalist was recommending an [place] or and MRI.

## 2018-09-06 ENCOUNTER — Other Ambulatory Visit: Payer: Self-pay

## 2018-09-06 ENCOUNTER — Ambulatory Visit (HOSPITAL_BASED_OUTPATIENT_CLINIC_OR_DEPARTMENT_OTHER)
Admission: RE | Admit: 2018-09-06 | Discharge: 2018-09-06 | Disposition: A | Discharge status: Home / Self Care | Payer: PPO | Source: Ambulatory Visit | Attending: Family Medicine | Admitting: Family Medicine

## 2018-09-06 DIAGNOSIS — M549 Dorsalgia, unspecified: Secondary | ICD-10-CM | POA: Diagnosis not present

## 2018-09-06 DIAGNOSIS — I77811 Abdominal aortic ectasia: Secondary | ICD-10-CM

## 2018-09-06 DIAGNOSIS — Z136 Encounter for screening for cardiovascular disorders: Secondary | ICD-10-CM | POA: Diagnosis not present

## 2018-09-07 ENCOUNTER — Other Ambulatory Visit: Payer: Self-pay

## 2018-09-07 ENCOUNTER — Encounter: Payer: Self-pay | Admitting: Family Medicine

## 2018-09-07 HISTORY — PX: OTHER SURGICAL HISTORY: SHX169

## 2018-09-07 MED ORDER — LEVOTHYROXINE SODIUM 112 MCG PO TABS: 112.0000 ug | ORAL_TABLET | Freq: Every day | ORAL | 1 refills | Status: DC

## 2018-09-08 NOTE — Telephone Encounter (Signed)
Pt was advised of [place] results yesterday.

## 2018-09-11 NOTE — Telephone Encounter (Signed)
Patient would like a call back. He isn't sure why an ultrasound was ordered for his sciatica pain which he said is a little better.

## 2018-09-12 NOTE — Telephone Encounter (Signed)
Pt's wife, Olin Hauser advised that PCP did not order [place] but Dr.Goldston did and would need to call his office regarding results or reason for [place].

## 2018-10-31 ENCOUNTER — Other Ambulatory Visit: Payer: Self-pay | Admitting: Family Medicine

## 2018-12-26 ENCOUNTER — Telehealth: Payer: Self-pay | Admitting: Family Medicine

## 2018-12-26 ENCOUNTER — Other Ambulatory Visit: Payer: Self-pay

## 2018-12-26 DIAGNOSIS — J309 Allergic rhinitis, unspecified: Secondary | ICD-10-CM

## 2018-12-26 MED ORDER — MONTELUKAST SODIUM 10 MG PO TABS: ORAL_TABLET | ORAL | 0 refills | Status: DC

## 2018-12-26 NOTE — Telephone Encounter (Signed)
Pt calling for a refill on his generic singulair to go to [facility] please. Has CPE scheduled for 02/01/19.

## 2018-12-26 NOTE — Telephone Encounter (Signed)
Rx sent to [facility]

## 2019-01-25 ENCOUNTER — Other Ambulatory Visit: Payer: Self-pay | Admitting: Family Medicine

## 2019-02-01 ENCOUNTER — Ambulatory Visit (INDEPENDENT_AMBULATORY_CARE_PROVIDER_SITE_OTHER): Payer: PPO | Admitting: Family Medicine

## 2019-02-01 ENCOUNTER — Encounter: Payer: Self-pay | Admitting: Family Medicine

## 2019-02-01 ENCOUNTER — Other Ambulatory Visit: Payer: Self-pay

## 2019-02-01 VITALS — BP 105/66 | HR 77 | Temp 97.9°F | Resp 16 | Ht 70.0 in | Wt 192.2 lb

## 2019-02-01 DIAGNOSIS — E039 Hypothyroidism, unspecified: Secondary | ICD-10-CM

## 2019-02-01 DIAGNOSIS — I10 Essential (primary) hypertension: Secondary | ICD-10-CM | POA: Diagnosis not present

## 2019-02-01 DIAGNOSIS — Z0000 Encounter for general adult medical examination without abnormal findings: Secondary | ICD-10-CM

## 2019-02-01 DIAGNOSIS — G4719 Other hypersomnia: Secondary | ICD-10-CM

## 2019-02-01 DIAGNOSIS — I2510 Atherosclerotic heart disease of native coronary artery without angina pectoris: Secondary | ICD-10-CM

## 2019-02-01 DIAGNOSIS — E663 Overweight: Secondary | ICD-10-CM

## 2019-02-01 DIAGNOSIS — T50905A Adverse effect of unspecified drugs, medicaments and biological substances, initial encounter: Secondary | ICD-10-CM

## 2019-02-01 DIAGNOSIS — R7303 Prediabetes: Secondary | ICD-10-CM | POA: Diagnosis not present

## 2019-02-01 DIAGNOSIS — Z125 Encounter for screening for malignant neoplasm of prostate: Secondary | ICD-10-CM | POA: Diagnosis not present

## 2019-02-01 DIAGNOSIS — E7800 Pure hypercholesterolemia, unspecified: Secondary | ICD-10-CM | POA: Diagnosis not present

## 2019-02-01 LAB — LIPID PANEL
Cholesterol: 147 mg/dL (ref 0–200)
HDL: 44.1 mg/dL (ref >39.00)
LDL Cholesterol: 77 mg/dL (ref 0–99)
NonHDL: 103.21
Total CHOL/HDL Ratio: 3
Triglycerides: 132 mg/dL (ref 0.0–149.0)
VLDL: 26.4 mg/dL (ref 0.0–40.0)

## 2019-02-01 LAB — COMPREHENSIVE METABOLIC PANEL
ALT: 24 U/L (ref 0–53)
AST: 18 U/L (ref 0–37)
Albumin: 4.4 g/dL (ref 3.5–5.2)
Alkaline Phosphatase: 91 U/L (ref 39–117)
BUN: 15 mg/dL (ref 6–23)
CO2: 27 mEq/L (ref 19–32)
Calcium: 9.4 mg/dL (ref 8.4–10.5)
Chloride: 107 mEq/L (ref 96–112)
Creatinine, Ser: 0.94 mg/dL (ref 0.40–1.50)
GFR: 79.36 mL/min (ref >60.00)
Glucose, Bld: 114 mg/dL — ABNORMAL HIGH (ref 70–99)
Potassium: 4.4 mEq/L (ref 3.5–5.1)
Sodium: 141 mEq/L (ref 135–145)
Total Bilirubin: 0.9 mg/dL (ref 0.2–1.2)
Total Protein: 6.7 g/dL (ref 6.0–8.3)

## 2019-02-01 LAB — CBC WITH DIFFERENTIAL/PLATELET
Basophils Absolute: 0.1 10*3/uL (ref 0.0–0.1)
Basophils Relative: 1.2 % (ref 0.0–3.0)
Eosinophils Absolute: 0.3 10*3/uL (ref 0.0–0.7)
Eosinophils Relative: 3.5 % (ref 0.0–5.0)
HCT: 50.8 % (ref 39.0–52.0)
Hemoglobin: 17.4 g/dL — ABNORMAL HIGH (ref 13.0–17.0)
Lymphocytes Relative: 41.1 % (ref 12.0–46.0)
Lymphs Abs: 3.9 10*3/uL (ref 0.7–4.0)
MCHC: 34.2 g/dL (ref 30.0–36.0)
MCV: 87.3 fl (ref 78.0–100.0)
Monocytes Absolute: 0.6 10*3/uL (ref 0.1–1.0)
Monocytes Relative: 6.5 % (ref 3.0–12.0)
Neutro Abs: 4.5 10*3/uL (ref 1.4–7.7)
Neutrophils Relative %: 47.7 % (ref 43.0–77.0)
Platelets: 202 10*3/uL (ref 150.0–400.0)
RBC: 5.82 Mil/uL — ABNORMAL HIGH (ref 4.22–5.81)
RDW: 14.4 % (ref 11.5–15.5)
WBC: 9.5 10*3/uL (ref 4.0–10.5)

## 2019-02-01 LAB — PSA, MEDICARE: PSA: 1.48 ng/ml (ref 0.10–4.00)

## 2019-02-01 LAB — HEMOGLOBIN A1C: Hgb A1c MFr Bld: 6 % (ref 4.6–6.5)

## 2019-02-01 LAB — TSH: TSH: 3.08 u[IU]/mL (ref 0.35–4.50)

## 2019-02-01 MED ORDER — BISOPROLOL FUMARATE 5 MG PO TABS: 5.0000 mg | ORAL_TABLET | Freq: Every day | ORAL | 3 refills | Status: DC

## 2019-02-01 NOTE — Progress Notes (Addendum)
Office Note 02/01/2019  CC:  Chief Complaint  Patient presents with  . Annual Exam    pt is fasting    HPI:  Lawrence Hunter is a 68 y.o. White male who is here for annual health maintenance exam.  For the last 6 mo he has been having a little numbness on bottoms of both feet intermittently as well as burning sensation in focal area on top of R foot that is noted more at night. No pain anywhere else. Spending more time on feet last few months. I just started him on metformin for prediabetes about 6 mo ago.  Describes excessive daytime sleepiness for the last couple years --"ever since I've been taking metoprolol".   He does not sleep well at night, up and down every few hours to urinate and has trouble getting back to sleep.  He denies snoring or witness apneic periods in sleep.  He dores not take any sleep meds.  No probs initiating sleep.  No awakenings in night with a gasp. As long as he is keeping busy he feels fine. Still smoking cigs.  Past Medical History:  Diagnosis Date  . Allergy   . Aortic root enlargement (HCC)    CTA chest/aorta 04/2017:  Mild enlargement of aortic root, measuring 3.9 x 3.8 x 3.6 cm.  . Arthralgia of both knees 09/2015   Plain films with minimal DJD changes medial compartment, o/w nl.  . Arthritis   . Claudication ([place])    Normal ABI's in 2006  . Coronary artery disease    s/p stents  . 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  Rpt u/s 3 yrs.  . Fatigue due to treatment    better when lopressor dose was decreased to 12.5mg  bid 04/2017.  Marland Kitchen History of adenomatous polyp of colon 05/19/2016   Recall 04/2021 (Dr. Havery Moros)  . Hyperlipidemia   . Hypothyroidism   . Nephrolithiasis    passed one stone approx 2008; no prob since (saw urologist briefly)  . Normal nuclear stress test 2012  . Prediabetes 02/2016   HbA1c 6.1%.  2019 A1c 6.3%.  . Prostatitis    When pt in 66s and 63s; saw  Dr. Gaynelle Arabian and eventually got a TURP per pt's description.  No probs since then.  . Recurrent sinusitis    Sinus plain films 08/2014: bilat maxillary sinusitis (chronic)  . S/P coronary artery stent placement 2005   RCA and LCX.  As of 02/2018 cardiology f/u, plan is to get ETT (pt doing fine, but was not having chest pain prior to last stent placement).  . Tobacco dependence    current as of 02/2018    Past Surgical History:  Procedure Laterality Date  . Aortic ultrasound  09/07/2018   Ectatic abd aorta (3.4 cm max diameter) but no aneurism.  Rpt u/s 3 yrs.  . APPENDECTOMY    . CARDIAC CATHETERIZATION  04/16/2003   NORMAL. EF 60%. TOTALLY OCCLUDED LEFT CIRCUMFLEX WITH SEVERE DISEASE, AND ULCERATED PLAQUE IN THE PROXIMAL RIGHT CORONARY ARTERY AND MILD ATHERSCLEROSIS IN THE LEFT ANTERIOR DESCENDING  . CARDIOVASCULAR STRESS TEST  2009;2012; 10/2014; 03/2018   2012 normal nuclear stress test.  2016 ETT no ischemia.  ETT normal 03/2018.  Marland Kitchen COLONOSCOPY W/ POLYPECTOMY  05/19/2016   Multiple polyps--tubular adenoma.  Recall 5 yrs (04/2021).  . CORONARY STENT PLACEMENT  04/19/2003   STENT PLACEMENT IN THE LEFT CIRCUMFLEX CORONARY AND RIGHT CORONARY ARTERY.  MILD RESIDUAL STENOSIS IN THE MID PORTION OF THE LEFT ANTERIOR DESCENDING  . TRANSTHORACIC ECHOCARDIOGRAM  05/03/2017   EF 60-65%, normal valves, mildly enlarged aortic root.    Family History  Problem Relation Age of Onset  . Hypertension Mother   . Stroke Mother   . Hypertension Father   . Stroke Father   . Prostate cancer Father   . Heart attack Brother        STENT  . Colon cancer Neg Hx     Social History   Socioeconomic History  . Marital status: Married    Spouse name: Not on file  . Number of children: 2  . Years of education: Not on file  . Highest education level: Not on file  Occupational History  . Occupation: Press photographer Rep  Social Needs  . Financial resource strain: Not on file  . Food insecurity    Worry: Not on  file    Inability: Not on file  . Transportation needs    Medical: Not on file    Non-medical: Not on file  Tobacco Use  . Smoking status: Current Every Day Smoker    Packs/day: 1.00    Years: 30.00    Pack years: 30.00    Types: Cigarettes  . Smokeless tobacco: Never Used  Substance and Sexual Activity  . Alcohol use: No  . Drug use: No  . Sexual activity: Yes  Lifestyle  . Physical activity    Days per week: Not on file    Minutes per session: Not on file  . Stress: Not on file  Relationships  . Social Herbalist on phone: Not on file    Gets together: Not on file    Attends religious service: Not on file    Active member of club or organization: Not on file    Attends meetings of clubs or organizations: Not on file    Relationship status: Not on file  . Intimate partner violence    Fear of current or ex partner: Not on file    Emotionally abused: Not on file    Physically abused: Not on file    Forced sexual activity: Not on file  Other Topics Concern  . Not on file  Social History Narrative   Married, 1 son in [place] and one in [place].   Lives in [place].   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   Tob: 40 pack-yr hx, current as of 04/2014.   Alcohol: social/rare.    Outpatient Medications Prior to Visit  Medication Sig Dispense Refill  . aspirin 81 MG tablet Take 81 mg by mouth daily.      Marland Kitchen atorvastatin (LIPITOR) 80 MG tablet TAKE 1 TABLET BY MOUTH EVERY DAY 90 tablet 2  . Cyanocobalamin (VITAMIN B-12 PO) Take 1 tablet by mouth daily.    . fluticasone (FLONASE) 50 MCG/ACT nasal spray Place 2 sprays into both nostrils daily. 16 g 11  . levothyroxine (SYNTHROID) 112 MCG tablet Take 1 tablet (112 mcg total) by mouth daily. 90 tablet 1  . metFORMIN (GLUCOPHAGE) 500 MG tablet TAKE 1 TABLET (500 MG TOTAL) BY MOUTH DAILY WITH SUPPER. 30 tablet 0  . metoprolol tartrate (LOPRESSOR) 25 MG tablet  TAKE 1 TABLET BY MOUTH TWICE A DAY 180 tablet 3  . montelukast (SINGULAIR) 10 MG tablet TAKE 1 TABLET BY MOUTH EVERYDAY AT BEDTIME 90 tablet 0  . HYDROcodone-acetaminophen ([place])  5-325 MG tablet Take 1 tablet by mouth every 4 (four) hours as needed for severe pain. (Patient not taking: Reported on 02/01/2019) 15 tablet 0   Facility-Administered Medications Prior to Visit  Medication Dose Route Frequency Provider Last Rate Last Dose  . 0.9 %  sodium chloride infusion  500 mL Intravenous Continuous Armbruster, Carlota Raspberry, MD        Allergies  Allergen Reactions  . Plavix [Clopidogrel Bisulfate] Rash    ROS Review of Systems  Constitutional: Negative for appetite change, chills, fatigue and fever.  HENT: Negative for congestion, dental problem, ear pain and sore throat.   Eyes: Negative for discharge, redness and visual disturbance.  Respiratory: Negative for cough, chest tightness, shortness of breath and wheezing.   Cardiovascular: Negative for chest pain, palpitations and leg swelling.  Gastrointestinal: Negative for abdominal pain, blood in stool, diarrhea, nausea and vomiting.  Genitourinary: Negative for difficulty urinating, dysuria, flank pain, frequency, hematuria and urgency.  Musculoskeletal: Negative for arthralgias, back pain, joint swelling, myalgias and neck stiffness.  Skin: Negative for pallor and rash.  Neurological: Positive for numbness (feet-see hpi). Negative for dizziness, speech difficulty, weakness and headaches.       Daytime sleepiness  Hematological: Negative for adenopathy. Does not bruise/bleed easily.  Psychiatric/Behavioral: Negative for confusion and sleep disturbance. The patient is not nervous/anxious.     PE; Blood pressure 105/66, pulse 77, temperature 97.9 F (36.6 C), temperature source Temporal, resp. rate 16, height 5\' 10"  (1.778 m), weight 192 lb 3.2 oz (87.2 kg), SpO2 96 %. Body mass index is 27.58 kg/m.  Gen: Alert, well appearing.  Patient  is oriented to person, place, time, and situation. AFFECT: pleasant, lucid thought and speech. ENT: Ears: EACs clear, normal epithelium.  TMs with good light reflex and landmarks bilaterally.  Eyes: no injection, icteris, swelling, or exudate.  EOMI, PERRLA. Nose: no drainage or turbinate edema/swelling.  No injection or focal lesion.  Mouth: lips without lesion/swelling.  Oral mucosa pink and moist.  Dentition intact and without obvious caries or gingival swelling.  Oropharynx without erythema, exudate, or swelling.  Neck: supple/nontender.  No LAD, mass, or TM.  Carotid pulses 2+ bilaterally, without bruits. CV: RRR, no m/r/g.   LUNGS: CTA bilat, nonlabored resps, good aeration in all lung fields. ABD: soft, NT, ND, BS normal.  No hepatospenomegaly or mass.  No bruits. EXT: no clubbing, cyanosis, or edema.  Musculoskeletal: no joint swelling, erythema, warmth, or tenderness.  ROM of all joints intact. Skin - no sores or suspicious lesions or rashes or color changes Rectal exam: negative without mass, lesions or tenderness, PROSTATE EXAM: smooth and symmetric without nodules or tenderness, enlarged 1 to 2+.  Foot exam - bilateral normal; no swelling, tenderness or skin or vascular lesions. Color and temperature is normal. Sensation is intact. Peripheral pulses are palpable but barely. Toenails are normal. No PAD changes to skin.   Pertinent labs:  Lab Results  Component Value Date   TSH 1.85 08/07/2018   No results found for: WBC, HGB, HCT, MCV, PLT Lab Results  Component Value Date   CREATININE 1.01 08/07/2018   BUN 15 08/07/2018   NA 140 08/07/2018   K 4.2 08/07/2018   CL 104 08/07/2018   CO2 27 08/07/2018   Lab Results  Component Value Date   ALT 26 04/25/2018   AST 21 04/25/2018   ALKPHOS 124 (H) 04/25/2018   BILITOT 0.8 04/25/2018   Lab Results  Component Value Date   CHOL  129 04/25/2018   Lab Results  Component Value Date   HDL 39 (L) 04/25/2018   Lab Results   Component Value Date   LDLCALC 71 04/25/2018   Lab Results  Component Value Date   TRIG 96 04/25/2018   Lab Results  Component Value Date   CHOLHDL 3.3 04/25/2018   Lab Results  Component Value Date   PSA 1.34 03/18/2016   Lab Results  Component Value Date   HGBA1C 6.2 08/07/2018    ASSESSMENT AND PLAN:   1) Excessive daytime somnolence: ? Not enough nighttime sleep vs ? Metoprolol side effect. No improvement with cutting metoprolol 25mg  in half bid for the last year or sol Will do trial of switch to bisoprolol 5mg  qd.  2) Prediabetes; recently with mild periph neuropathy sx's. Exam pretty normal at this time.  3) Health maintenance exam: Reviewed age and gender appropriate health maintenance issues (prudent diet, regular exercise, health risks of tobacco and excessive alcohol, use of seatbelts, fire alarms in home, use of sunscreen).  Also reviewed age and gender appropriate health screening as well as vaccine recommendations. Vaccines: Shingrix->he is deferring this for now.  All other vaccines UTD. Labs: fasting HP labs, Hba1c (prediab), PSA. Prostate ca screening: DRE , PSA. Colon ca screening: recall 07/2021.  4) CAD-asymptomatic.  Continue ASA, statin, note switch from metop to bisop. Encouraged smoking cessation.  An After Visit Summary was printed and given to the patient.  FOLLOW UP:  Return in about 4 months (around 06/01/2019) for routine chronic illness f/u.  Signed:  Crissie Sickles, MD           02/01/2019

## 2019-02-01 NOTE — Patient Instructions (Addendum)
Stop metoprolol. Start bisoprolol as per updated med list.    Health Maintenance, Male Adopting a healthy lifestyle and getting preventive care are important in promoting health and wellness. Ask your health care provider about:  The right schedule for you to have regular tests and exams.  Things you can do on your own to prevent diseases and keep yourself healthy. What should I know about diet, weight, and exercise? Eat a healthy diet   Eat a diet that includes plenty of vegetables, fruits, low-fat dairy products, and lean protein.  Do not eat a lot of foods that are high in solid fats, added sugars, or sodium. Maintain a healthy weight Body mass index (BMI) is a measurement that can be used to identify possible weight problems. It estimates body fat based on height and weight. Your health care provider can help determine your BMI and help you achieve or maintain a healthy weight. Get regular exercise Get regular exercise. This is one of the most important things you can do for your health. Most adults should:  Exercise for at least 150 minutes each week. The exercise should increase your heart rate and make you sweat (moderate-intensity exercise).  Do strengthening exercises at least twice a week. This is in addition to the moderate-intensity exercise.  Spend less time sitting. Even light physical activity can be beneficial. Watch cholesterol and blood lipids Have your blood tested for lipids and cholesterol at 70 years of age, then have this test every 5 years. You may need to have your cholesterol levels checked more often if:  Your lipid or cholesterol levels are high.  You are older than 70 years of age.  You are at high risk for heart disease. What should I know about cancer screening? Many types of cancers can be detected early and may often be prevented. Depending on your health history and family history, you may need to have cancer screening at various ages. This may  include screening for:  Colorectal cancer.  Prostate cancer.  Skin cancer.  Lung cancer. What should I know about heart disease, diabetes, and high blood pressure? Blood pressure and heart disease  High blood pressure causes heart disease and increases the risk of stroke. This is more likely to develop in people who have high blood pressure readings, are of African descent, or are overweight.  Talk with your health care provider about your target blood pressure readings.  Have your blood pressure checked: ? Every 3-5 years if you are 28-33 years of age. ? Every year if you are 66 years old or older.  If you are between the ages of 25 and 81 and are a current or former smoker, ask your health care provider if you should have a one-time screening for abdominal aortic aneurysm (AAA). Diabetes Have regular diabetes screenings. This checks your fasting blood sugar level. Have the screening done:  Once every three years after age 24 if you are at a normal weight and have a low risk for diabetes.  More often and at a younger age if you are overweight or have a high risk for diabetes. What should I know about preventing infection? Hepatitis B If you have a higher risk for hepatitis B, you should be screened for this virus. Talk with your health care provider to find out if you are at risk for hepatitis B infection. Hepatitis C Blood testing is recommended for:  Everyone born from [birth year] through 1967.  Anyone with known risk factors for hepatitis  C. Sexually transmitted infections (STIs)  You should be screened each year for STIs, including gonorrhea and chlamydia, if: ? You are sexually active and are younger than 71 years of age. ? You are older than 70 years of age and your health care provider tells you that you are at risk for this type of infection. ? Your sexual activity has changed since you were last screened, and you are at increased risk for chlamydia or gonorrhea. Ask your  health care provider if you are at risk.  Ask your health care provider about whether you are at high risk for HIV. Your health care provider may recommend a prescription medicine to help prevent HIV infection. If you choose to take medicine to prevent HIV, you should first get tested for HIV. You should then be tested every 3 months for as long as you are taking the medicine. Follow these instructions at home: Lifestyle  Do not use any products that contain nicotine or tobacco, such as cigarettes, e-cigarettes, and chewing tobacco. If you need help quitting, ask your health care provider.  Do not use street drugs.  Do not share needles.  Ask your health care provider for help if you need support or information about quitting drugs. Alcohol use  Do not drink alcohol if your health care provider tells you not to drink.  If you drink alcohol: ? Limit how much you have to 0-2 drinks a day. ? Be aware of how much alcohol is in your drink. In the [place] one drink equals one 12 oz bottle of beer (355 mL), one 5 oz glass of wine (148 mL), or one 1 oz glass of hard liquor (44 mL). General instructions  Schedule regular health, dental, and eye exams.  Stay current with your vaccines.  Tell your health care provider if: ? You often feel depressed. ? You have ever been abused or do not feel safe at home. Summary  Adopting a healthy lifestyle and getting preventive care are important in promoting health and wellness.  Follow your health care provider's instructions about healthy diet, exercising, and getting tested or screened for diseases.  Follow your health care provider's instructions on monitoring your cholesterol and blood pressure. This information is not intended to replace advice given to you by your health care provider. Make sure you discuss any questions you have with your health care provider. Document Released: 09/04/2007 Document Revised: 03/01/2018 Document Reviewed:  03/01/2018 Elsevier Patient Education  2020 [employer].

## 2019-02-08 NOTE — Progress Notes (Signed)
Chief Complaint  Patient presents with  . Follow-up    CAD     History of Present Illness: 68 yo male with history of CAD, HLD, tobacco abuse and hypothyroidism here today for cardiac follow up. He had a NSTEMI in January 2005 and had a Cypher drug eluting stent placed in the occluded Circumflex and a Cypher drug eluting stent placed in the RCA. He has had no further caths since then. Stress myoview in July 2012 showed no ischemia. Exercise stress test August 2016 with no ischemia. Echo February 2019 with LVEF=60-65%, LVH. The aortic root was mildly dilated. Chest CTA with no evidence of aneurysm or dissection of the aorta. Exercise stress test January 2020 with no ischemic EKG changes.   He is here today for follow up. The patient denies any chest pain, dyspnea, palpitations, lower extremity edema, orthopnea, PND, dizziness, near syncope or syncope. He has been somnolent during the day.   Primary Care Physician: Tammi Sou, MD  Past Medical History:  Diagnosis Date  . Allergy   . Aortic root enlargement (HCC)    CTA chest/aorta 04/2017:  Mild enlargement of aortic root, measuring 3.9 x 3.8 x 3.6 cm.  . Arthralgia of both knees 09/2015   Plain films with minimal DJD changes medial compartment, o/w nl.  . Arthritis   . Claudication ([place])    Normal ABI's in 2006  . Coronary artery disease    s/p stents  . Ectatic abdominal aorta ([place]) 08/2018   Ectatic abd aorta (3.4 cm max diameter) but no aneurism.  Rpt u/s 3 yrs.  . Fatigue due to treatment    better when lopressor dose was decreased to 12.5mg  bid 04/2017.  Marland Kitchen History of adenomatous polyp of colon 05/19/2016   Recall 04/2021 (Dr. Havery Moros)  . Hyperlipidemia   . Hypothyroidism   . Nephrolithiasis    passed one stone approx 2008; no prob since (saw urologist briefly)  . Normal nuclear stress test 2012  . Prediabetes 02/2016   HbA1c 6.1%.  2019 A1c 6.3%.  . Prostatitis    When pt in 56s and 46s; saw Dr. Gaynelle Arabian and  eventually got a TURP per pt's description.  No probs since then.  . Recurrent sinusitis    Sinus plain films 08/2014: bilat maxillary sinusitis (chronic)  . S/P coronary artery stent placement 2005   RCA and LCX.  As of 02/2018 cardiology f/u, plan is to get ETT (pt doing fine, but was not having chest pain prior to last stent placement).  . Tobacco dependence    current as of 02/2018    Past Surgical History:  Procedure Laterality Date  . Aortic ultrasound  09/07/2018   Ectatic abd aorta (3.4 cm max diameter) but no aneurism.  Rpt u/s 3 yrs.  . APPENDECTOMY    . CARDIAC CATHETERIZATION  04/16/2003   NORMAL. EF 60%. TOTALLY OCCLUDED LEFT CIRCUMFLEX WITH SEVERE DISEASE, AND ULCERATED PLAQUE IN THE PROXIMAL RIGHT CORONARY ARTERY AND MILD ATHERSCLEROSIS IN THE LEFT ANTERIOR DESCENDING  . CARDIOVASCULAR STRESS TEST  2009;2012; 10/2014; 03/2018   2012 normal nuclear stress test.  2016 ETT no ischemia.  ETT normal 03/2018.  Marland Kitchen COLONOSCOPY W/ POLYPECTOMY  05/19/2016   Multiple polyps--tubular adenoma.  Recall 5 yrs (04/2021).  . CORONARY STENT PLACEMENT  04/19/2003   STENT PLACEMENT IN THE LEFT CIRCUMFLEX CORONARY AND RIGHT CORONARY ARTERY. MILD RESIDUAL STENOSIS IN THE MID PORTION OF THE LEFT ANTERIOR DESCENDING  . TRANSTHORACIC ECHOCARDIOGRAM  05/03/2017  EF 60-65%, normal valves, mildly enlarged aortic root.    Current Outpatient Medications  Medication Sig Dispense Refill  . aspirin 81 MG tablet Take 81 mg by mouth daily.      Marland Kitchen atorvastatin (LIPITOR) 80 MG tablet TAKE 1 TABLET BY MOUTH EVERY DAY 90 tablet 2  . bisoprolol (ZEBETA) 5 MG tablet Take 1 tablet (5 mg total) by mouth daily. 30 tablet 3  . Cyanocobalamin (VITAMIN B-12 PO) Take 1 tablet by mouth daily.    . fluticasone (FLONASE) 50 MCG/ACT nasal spray Place 2 sprays into both nostrils daily. 16 g 11  . levothyroxine (SYNTHROID) 112 MCG tablet Take 1 tablet (112 mcg total) by mouth daily. 90 tablet 1  . metFORMIN (GLUCOPHAGE) 500 MG  tablet TAKE 1 TABLET (500 MG TOTAL) BY MOUTH DAILY WITH SUPPER. 30 tablet 0  . montelukast (SINGULAIR) 10 MG tablet TAKE 1 TABLET BY MOUTH EVERYDAY AT BEDTIME 90 tablet 0   Current Facility-Administered Medications  Medication Dose Route Frequency Provider Last Rate Last Dose  . 0.9 %  sodium chloride infusion  500 mL Intravenous Continuous Armbruster, Carlota Raspberry, MD        Allergies  Allergen Reactions  . Plavix [Clopidogrel Bisulfate] Rash    Social History   Socioeconomic History  . Marital status: Married    Spouse name: Not on file  . Number of children: 2  . Years of education: Not on file  . Highest education level: Not on file  Occupational History  . Occupation: Press photographer Rep  Social Needs  . Financial resource strain: Not on file  . Food insecurity    Worry: Not on file    Inability: Not on file  . Transportation needs    Medical: Not on file    Non-medical: Not on file  Tobacco Use  . Smoking status: Current Every Day Smoker    Packs/day: 1.00    Years: 30.00    Pack years: 30.00    Types: Cigarettes  . Smokeless tobacco: Never Used  Substance and Sexual Activity  . Alcohol use: No  . Drug use: No  . Sexual activity: Yes  Lifestyle  . Physical activity    Days per week: Not on file    Minutes per session: Not on file  . Stress: Not on file  Relationships  . Social Herbalist on phone: Not on file    Gets together: Not on file    Attends religious service: Not on file    Active member of club or organization: Not on file    Attends meetings of clubs or organizations: Not on file    Relationship status: Not on file  . Intimate partner violence    Fear of current or ex partner: Not on file    Emotionally abused: Not on file    Physically abused: Not on file    Forced sexual activity: Not on file  Other Topics Concern  . Not on file  Social History Narrative   Married, 1 son in [place] and one in [place].   Lives in [place].   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   Tob: 40 pack-yr hx, current as of 04/2014.   Alcohol: social/rare.    Family History  Problem Relation Age of Onset  . Hypertension Mother   . Stroke Mother   . Hypertension Father   . Stroke Father   . Prostate  cancer Father   . Heart attack Brother        STENT  . Colon cancer Neg Hx     Review of Systems:  As stated in the HPI and otherwise negative.   BP 120/60   Pulse (!) 54   Ht 5\' 10"  (1.778 m)   Wt 193 lb 6.4 oz (87.7 kg)   SpO2 97%   BMI 27.75 kg/m   Physical Examination: General: Well developed, well nourished, NAD  HEENT: OP clear, mucus membranes moist  SKIN: warm, dry. No rashes. Neuro: No focal deficits  Musculoskeletal: Muscle strength 5/5 all ext  Psychiatric: Mood and affect normal  Neck: No JVD, no carotid bruits, no thyromegaly, no lymphadenopathy.  Lungs:Clear bilaterally, no wheezes, rhonci, crackles Cardiovascular: Regular rate and rhythm. No murmurs, gallops or rubs. Abdomen:Soft. Bowel sounds present. Non-tender.  Extremities: No lower extremity edema. Pulses are 2 + in the bilateral DP/PT.  Echo February 2019: Left ventricle: The cavity size was normal. There was severe   focal basal and moderate concentric hypertrophy. Systolic   function was normal. The estimated ejection fraction was in the   range of 60% to 65%. Wall motion was normal; there were no   regional wall motion abnormalities. Left ventricular diastolic   function parameters were normal. - Aortic valve: Moderate focal calcification involving the   noncoronary cusp. - Aorta: Aortic root dimension: 42 mm (ED). Ascending aortic   diameter: 39 mm (S). - Aortic root: The aortic root was mildly dilated. - Ascending aorta: The ascending aorta was mildly dilated. - Left atrium: The atrium was moderately dilated.  EKG:  EKG is ordered today. The ekg ordered today demonstrates  Sinus brady, rate  54 bpm.   Recent Labs: 02/01/2019: ALT 24; BUN 15; Creatinine, Ser 0.94; Hemoglobin 17.4; Platelets 202.0; Potassium 4.4; Sodium 141; TSH 3.08   Lipid Panel    Component Value Date/Time   CHOL 147 02/01/2019 0935   CHOL 129 04/25/2018 0820   TRIG 132.0 02/01/2019 0935   HDL 44.10 02/01/2019 0935   HDL 39 (L) 04/25/2018 0820   CHOLHDL 3 02/01/2019 0935   VLDL 26.4 02/01/2019 0935   LDLCALC 77 02/01/2019 0935   LDLCALC 71 04/25/2018 0820     Wt Readings from Last 3 Encounters:  02/09/19 193 lb 6.4 oz (87.7 kg)  02/01/19 192 lb 3.2 oz (87.2 kg)  09/03/18 198 lb (89.8 kg)     Other studies Reviewed: Additional studies/ records that were reviewed today include: . Review of the above records demonstrates:    Assessment and Plan:   1. CAD without angina: LV function normal by echo in February 2019. Normal exercise stress test January 2020. He has no chest pain. Will continue ASA, beta blocker and statin.    2. Tobacco abuse: I have asked him to stop smoking. He wishes to stop  3. Hyperlipidemia: LDL near goal last week. Continue statin.   4. Daytime somnolence: We discussed arranging a sleep study but he wants to think about this.   Current medicines are reviewed at length with the patient today.  The patient does not have concerns regarding medicines.  The following changes have been made:  no change  Labs/ tests ordered today include:   Orders Placed This Encounter  Procedures  . EKG 12-Lead    Disposition:   FU with me in 12 months  Signed, Lauree Chandler, MD 02/09/2019 9:09 AM    [facility]  [address] [place], [place]  [address] Phone: [phone]; Fax: [phone]

## 2019-02-09 ENCOUNTER — Encounter: Payer: Self-pay | Admitting: Cardiovascular Disease

## 2019-02-09 ENCOUNTER — Ambulatory Visit (INDEPENDENT_AMBULATORY_CARE_PROVIDER_SITE_OTHER): Payer: PPO | Admitting: Cardiovascular Disease

## 2019-02-09 ENCOUNTER — Other Ambulatory Visit: Payer: Self-pay

## 2019-02-09 VITALS — BP 120/60 | HR 54 | Ht 70.0 in | Wt 193.4 lb

## 2019-02-09 DIAGNOSIS — Z720 Tobacco use: Secondary | ICD-10-CM

## 2019-02-09 DIAGNOSIS — E7800 Pure hypercholesterolemia, unspecified: Secondary | ICD-10-CM

## 2019-02-09 DIAGNOSIS — I2510 Atherosclerotic heart disease of native coronary artery without angina pectoris: Secondary | ICD-10-CM

## 2019-02-09 NOTE — Patient Instructions (Addendum)
Medication Instructions:  Your physician recommends that you continue on your current medications as directed. Please refer to the Current Medication list given to you today.  *If you need a refill on your cardiac medications before your next appointment, please call your pharmacy*  Lab Work: None Ordered   Testing/Procedures: None Ordered   Follow-Up: At [facility], you and your health needs are our priority.  As part of our continuing mission to provide you with exceptional heart care, we have created designated Provider Care Teams.  These Care Teams include your primary Cardiologist (physician) and Advanced Practice Providers (APPs -  Physician Assistants and Nurse Practitioners) who all work together to provide you with the care you need, when you need it.  Your next appointment:   12 month(s)  The format for your next appointment:   In Person  Provider:   You may see Lauree Chandler, MD or one of the following Advanced Practice Providers on your designated Care Team:    Melina Copa, PA-C  Ermalinda Barrios, PA-C

## 2019-02-18 ENCOUNTER — Other Ambulatory Visit: Payer: Self-pay | Admitting: Family Medicine

## 2019-03-08 ENCOUNTER — Other Ambulatory Visit: Payer: Self-pay | Admitting: Cardiovascular Disease

## 2019-03-08 ENCOUNTER — Other Ambulatory Visit: Payer: Self-pay | Admitting: Family Medicine

## 2019-03-08 DIAGNOSIS — E7800 Pure hypercholesterolemia, unspecified: Secondary | ICD-10-CM

## 2019-03-09 ENCOUNTER — Ambulatory Visit: Payer: PPO | Attending: Internal Medicine

## 2019-03-09 DIAGNOSIS — U071 COVID-19: Secondary | ICD-10-CM

## 2019-03-09 DIAGNOSIS — R238 Other skin changes: Secondary | ICD-10-CM | POA: Diagnosis not present

## 2019-03-10 LAB — NOVEL CORONAVIRUS, NAA: SARS-CoV-2, NAA: NOT DETECTED

## 2019-03-14 ENCOUNTER — Encounter: Payer: Self-pay | Admitting: Family Medicine

## 2019-03-18 ENCOUNTER — Other Ambulatory Visit: Payer: Self-pay | Admitting: Family Medicine

## 2019-03-18 DIAGNOSIS — J309 Allergic rhinitis, unspecified: Secondary | ICD-10-CM

## 2019-04-13 ENCOUNTER — Other Ambulatory Visit: Payer: Self-pay | Admitting: Family Medicine

## 2019-04-19 ENCOUNTER — Ambulatory Visit: Payer: PPO

## 2019-04-28 ENCOUNTER — Ambulatory Visit: Payer: PPO

## 2019-05-09 DIAGNOSIS — H903 Sensorineural hearing loss, bilateral: Secondary | ICD-10-CM | POA: Diagnosis not present

## 2019-05-14 ENCOUNTER — Other Ambulatory Visit: Payer: Self-pay | Admitting: Family Medicine

## 2019-05-18 ENCOUNTER — Ambulatory Visit: Payer: PPO | Admitting: Cardiovascular Disease

## 2019-05-31 ENCOUNTER — Other Ambulatory Visit: Payer: Self-pay | Admitting: Family Medicine

## 2019-06-04 ENCOUNTER — Ambulatory Visit: Payer: PPO | Admitting: Family Medicine

## 2019-06-06 ENCOUNTER — Ambulatory Visit (INDEPENDENT_AMBULATORY_CARE_PROVIDER_SITE_OTHER): Payer: PPO | Admitting: Family Medicine

## 2019-06-06 ENCOUNTER — Encounter: Payer: Self-pay | Admitting: Family Medicine

## 2019-06-06 ENCOUNTER — Other Ambulatory Visit: Payer: Self-pay

## 2019-06-06 ENCOUNTER — Other Ambulatory Visit: Payer: Self-pay | Admitting: Family Medicine

## 2019-06-06 VITALS — BP 120/72 | HR 57 | Temp 97.9°F | Resp 16 | Ht 70.0 in | Wt 197.4 lb

## 2019-06-06 DIAGNOSIS — D751 Secondary polycythemia: Secondary | ICD-10-CM

## 2019-06-06 DIAGNOSIS — I2510 Atherosclerotic heart disease of native coronary artery without angina pectoris: Secondary | ICD-10-CM

## 2019-06-06 DIAGNOSIS — F17200 Nicotine dependence, unspecified, uncomplicated: Secondary | ICD-10-CM

## 2019-06-06 DIAGNOSIS — R7303 Prediabetes: Secondary | ICD-10-CM

## 2019-06-06 DIAGNOSIS — E7800 Pure hypercholesterolemia, unspecified: Secondary | ICD-10-CM | POA: Diagnosis not present

## 2019-06-06 DIAGNOSIS — R400 Somnolence: Secondary | ICD-10-CM | POA: Diagnosis not present

## 2019-06-06 NOTE — Progress Notes (Signed)
OFFICE VISIT  06/06/2019   CC:  Chief Complaint  Patient presents with  . Follow-up    RCI, pt is fasting    HPI:    Patient is a 69 y.o.  male who presents for 4 mo f/u multiple medical problems ongoing: HLD, tobacco depend, CAD, prediabetes.  A/P as of last visit: ") Excessive daytime somnolence: ? Not enough nighttime sleep vs ? Metoprolol side effect. No improvement with cutting metoprolol 25mg  in half bid for the last year or sol Will do trial of switch to bisoprolol 5mg  qd.  2) Prediabetes; recently with mild periph neuropathy sx's. Exam pretty normal at this time.  3) Health maintenance exam: Reviewed age and gender appropriate health maintenance issues (prudent diet, regular exercise, health risks of tobacco and excessive alcohol, use of seatbelts, fire alarms in home, use of sunscreen).  Also reviewed age and gender appropriate health screening as well as vaccine recommendations. Vaccines: Shingrix->he is deferring this for now.  All other vaccines UTD. Labs: fasting HP labs, Hba1c (prediab), PSA. Prostate ca screening: DRE , PSA. Colon ca screening: recall 07/2021.  4) CAD-asymptomatic.  Continue ASA, statin, note switch from metop to bisop. Encouraged smoking cessation."  Interim hx: Cardiology f/u 02/09/19, no changes made. Dr. Angelena Form encouraged sleep study for his excessive daytime sleepiness and pt wanted to think about it first. Still smoking 1 ppd cigs.  He says he is getting closer to trying to quit. No tingling or burning in feet or hands, occ numbness on plantar surfaces both fee-->not sure if related to prolonged time standing. Diet: not really focusing on limiting starchy foods and sugary foods.   He is working on building projects around his home, very busy. We discussed lung ca screening today but he wants to hold off for now. Continues with good compliance with his statin, no side effects. No home bp monitoring to report.   ROS: no fevers, no  CP, no SOB, no wheezing, no cough, no dizziness, no HAs, no rashes, no melena/hematochezia.  No polyuria or polydipsia.  No myalgias or arthralgias.  No focal weakness, paresthesias, or tremors.  No acute vision or hearing abnormalities. No n/v/d or abd pain.  No palpitations.    Past Medical History:  Diagnosis Date  . Allergy   . Aortic root enlargement (HCC)    CTA chest/aorta 04/2017:  Mild enlargement of aortic root, measuring 3.9 x 3.8 x 3.6 cm.  . Arthralgia of both knees 09/2015   Plain films with minimal DJD changes medial compartment, o/w nl.  . Arthritis   . Claudication ([place])    Normal ABI's in 2006  . Coronary artery disease    s/p stents  . 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  Rpt u/s 3 yrs.  . Fatigue due to treatment    better when lopressor dose was decreased to 12.5mg  bid 04/2017.  Marland Kitchen History of adenomatous polyp of colon 05/19/2016   Recall 04/2021 (Dr. Havery Moros)  . Hyperlipidemia   . Hypothyroidism   . Nephrolithiasis    passed one stone approx 2008; no prob since (saw urologist briefly)  . Normal nuclear stress test 2012  . Prediabetes 02/2016   HbA1c 6.1%.  2019 A1c 6.3%.  . Prostatitis    When pt in 45s and 44s; saw Dr. Gaynelle Arabian and eventually got a TURP per pt's description.  No probs since then.  . Recurrent sinusitis    Sinus plain  films 08/2014: bilat maxillary sinusitis (chronic)  . S/P coronary artery stent placement 2005   RCA and LCX.  As of 02/2018 cardiology f/u, plan is to get ETT (pt doing fine, but was not having chest pain prior to last stent placement).  . Tobacco dependence    current as of 01/2019    Past Surgical History:  Procedure Laterality Date  . Aortic ultrasound  09/07/2018   Ectatic abd aorta (3.4 cm max diameter) but no aneurism.  Rpt u/s 3 yrs.  . APPENDECTOMY    . CARDIAC CATHETERIZATION  04/16/2003   NORMAL. EF 60%. TOTALLY OCCLUDED LEFT CIRCUMFLEX WITH SEVERE  DISEASE, AND ULCERATED PLAQUE IN THE PROXIMAL RIGHT CORONARY ARTERY AND MILD ATHERSCLEROSIS IN THE LEFT ANTERIOR DESCENDING  . CARDIOVASCULAR STRESS TEST  2009;2012; 10/2014; 03/2018   2012 normal nuclear stress test.  2016 ETT no ischemia.  ETT normal 03/2018.  Marland Kitchen COLONOSCOPY W/ POLYPECTOMY  05/19/2016   Multiple polyps--tubular adenoma.  Recall 5 yrs (04/2021).  . CORONARY STENT PLACEMENT  04/19/2003   STENT PLACEMENT IN THE LEFT CIRCUMFLEX CORONARY AND RIGHT CORONARY ARTERY. MILD RESIDUAL STENOSIS IN THE MID PORTION OF THE LEFT ANTERIOR DESCENDING  . TRANSTHORACIC ECHOCARDIOGRAM  05/03/2017   EF 60-65%, normal valves, mildly enlarged aortic root.    Outpatient Medications Prior to Visit  Medication Sig Dispense Refill  . aspirin 81 MG tablet Take 81 mg by mouth daily.      Marland Kitchen atorvastatin (LIPITOR) 80 MG tablet TAKE 1 TABLET BY MOUTH EVERY DAY 90 tablet 3  . bisoprolol (ZEBETA) 5 MG tablet TAKE 1 TABLET BY MOUTH EVERY DAY 90 tablet 0  . Cyanocobalamin (VITAMIN B-12 PO) Take 1 tablet by mouth daily.    . fluticasone (FLONASE) 50 MCG/ACT nasal spray Place 2 sprays into both nostrils daily. 16 g 11  . levothyroxine (SYNTHROID) 112 MCG tablet TAKE 1 TABLET BY MOUTH EVERY DAY 90 tablet 0  . metFORMIN (GLUCOPHAGE) 500 MG tablet TAKE 1 TABLET (500 MG TOTAL) BY MOUTH DAILY WITH SUPPER. 30 tablet 0  . montelukast (SINGULAIR) 10 MG tablet TAKE 1 TABLET BY MOUTH EVERYDAY AT BEDTIME 90 tablet 1   Facility-Administered Medications Prior to Visit  Medication Dose Route Frequency Provider Last Rate Last Admin  . 0.9 %  sodium chloride infusion  500 mL Intravenous Continuous Armbruster, Carlota Raspberry, MD        Allergies  Allergen Reactions  . Plavix [Clopidogrel Bisulfate] Rash    ROS As per HPI  PE: Blood pressure 120/72, pulse (!) 57, temperature 97.9 F (36.6 C), temperature source Temporal, resp. rate 16, height 5\' 10"  (1.778 m), weight 197 lb 6.4 oz (89.5 kg), SpO2 100 %. Gen: Alert, well  appearing.  Patient is oriented to person, place, time, and situation. AFFECT: pleasant, lucid thought and speech. CV: RRR, no m/r/g.   LUNGS: CTA bilat, nonlabored resps, good aeration in all lung fields.   LABS:  Lab Results  Component Value Date   TSH 3.08 02/01/2019   Lab Results  Component Value Date   WBC 9.5 02/01/2019   HGB 17.4 (H) 02/01/2019   HCT 50.8 02/01/2019   MCV 87.3 02/01/2019   PLT 202.0 02/01/2019   Lab Results  Component Value Date   CREATININE 0.94 02/01/2019   BUN 15 02/01/2019   NA 141 02/01/2019   K 4.4 02/01/2019   CL 107 02/01/2019   CO2 27 02/01/2019   Lab Results  Component Value Date   ALT 24 02/01/2019  AST 18 02/01/2019   ALKPHOS 91 02/01/2019   BILITOT 0.9 02/01/2019   Lab Results  Component Value Date   CHOL 147 02/01/2019   Lab Results  Component Value Date   HDL 44.10 02/01/2019   Lab Results  Component Value Date   LDLCALC 77 02/01/2019   Lab Results  Component Value Date   TRIG 132.0 02/01/2019   Lab Results  Component Value Date   CHOLHDL 3 02/01/2019   Lab Results  Component Value Date   PSA 1.48 02/01/2019   PSA 1.34 03/18/2016   Lab Results  Component Value Date   HGBA1C 6.0 02/01/2019    IMPRESSION AND PLAN:  1) Prediabetes: really needs to get started with TLC. Hba1c and fasting glucose today.  2) HLD: tolerating statin.  LDL 77, hepatic panel normal 4 mo ago.  Plan repeat lipids 3 mo.  3) Tobacco dependence: high risk for lung cancer obviously. I encouraged him to get lung cancer screening CT but he declined at this time, says he is close to picking a quit date for cessation trial.  He has mild erythrocytosis, suspect from long term smoking.  Recheck CBC today and if stable will plan on q6-12 mo f/u Hb.  4) CAD: asympt.  Preserved LV function.  Continue ASA, BB, statin.  Keep approp cardiology f/u.  5) Daytime somnolence: he feels like this is less of a problem compared to the last 6 mo. Says  sleep feels restorative, no morning HAs.  No witnessed apnea in sleep. No probs with fighting off sleep while driving.  Main issue is falling asleep easily after suppertime. He continues to decline referral for OSA eval today.  An After Visit Summary was printed and given to the patient.  FOLLOW UP: Return in about 3 months (around 09/06/2019) for routine chronic illness f/u.  Signed:  Crissie Sickles, MD           06/06/2019

## 2019-06-07 LAB — BASIC METABOLIC PANEL
BUN: 12 mg/dL (ref 7–25)
CO2: 24 mmol/L (ref 20–32)
Calcium: 9.6 mg/dL (ref 8.6–10.3)
Chloride: 106 mmol/L (ref 98–110)
Creat: 0.89 mg/dL (ref 0.70–1.18)
Glucose, Bld: 91 mg/dL (ref 65–99)
Potassium: 4.1 mmol/L (ref 3.5–5.3)
Sodium: 141 mmol/L (ref 135–146)

## 2019-06-07 LAB — CBC
HCT: 49.3 % (ref 38.5–50.0)
Hemoglobin: 16.6 g/dL (ref 13.2–17.1)
MCH: 29.2 pg (ref 27.0–33.0)
MCHC: 33.7 g/dL (ref 32.0–36.0)
MCV: 86.6 fL (ref 80.0–100.0)
MPV: 11.3 fL (ref 7.5–12.5)
Platelets: 193 10*3/uL (ref 140–400)
RBC: 5.69 10*6/uL (ref 4.20–5.80)
RDW: 13.4 % (ref 11.0–15.0)
WBC: 8.6 10*3/uL (ref 3.8–10.8)

## 2019-06-07 LAB — HEMOGLOBIN A1C
Hgb A1c MFr Bld: 5.6 % of total Hgb (ref <5.7)
Mean Plasma Glucose: 114 (calc)
eAG (mmol/L): 6.3 (calc)

## 2019-06-29 ENCOUNTER — Other Ambulatory Visit: Payer: Self-pay | Admitting: Family Medicine

## 2019-07-16 ENCOUNTER — Other Ambulatory Visit: Payer: Self-pay | Admitting: Family Medicine

## 2019-08-06 DIAGNOSIS — H903 Sensorineural hearing loss, bilateral: Secondary | ICD-10-CM | POA: Diagnosis not present

## 2019-08-23 ENCOUNTER — Other Ambulatory Visit: Payer: Self-pay | Admitting: Family Medicine

## 2019-09-11 ENCOUNTER — Other Ambulatory Visit: Payer: Self-pay | Admitting: Family Medicine

## 2019-09-11 DIAGNOSIS — J309 Allergic rhinitis, unspecified: Secondary | ICD-10-CM

## 2019-09-22 ENCOUNTER — Other Ambulatory Visit: Payer: Self-pay | Admitting: Family Medicine

## 2019-09-25 ENCOUNTER — Encounter: Payer: Self-pay | Admitting: Family Medicine

## 2019-10-07 ENCOUNTER — Other Ambulatory Visit: Payer: Self-pay | Admitting: Family Medicine

## 2019-10-15 ENCOUNTER — Encounter: Payer: Self-pay | Admitting: Family Medicine

## 2019-10-15 ENCOUNTER — Other Ambulatory Visit: Payer: Self-pay

## 2019-10-15 ENCOUNTER — Ambulatory Visit (INDEPENDENT_AMBULATORY_CARE_PROVIDER_SITE_OTHER): Payer: PPO | Admitting: Family Medicine

## 2019-10-15 VITALS — BP 113/69 | HR 62 | Temp 97.9°F | Resp 16 | Ht 70.0 in | Wt 196.6 lb

## 2019-10-15 DIAGNOSIS — I2510 Atherosclerotic heart disease of native coronary artery without angina pectoris: Secondary | ICD-10-CM

## 2019-10-15 DIAGNOSIS — G4710 Hypersomnia, unspecified: Secondary | ICD-10-CM

## 2019-10-15 DIAGNOSIS — E039 Hypothyroidism, unspecified: Secondary | ICD-10-CM | POA: Diagnosis not present

## 2019-10-15 DIAGNOSIS — R7303 Prediabetes: Secondary | ICD-10-CM | POA: Diagnosis not present

## 2019-10-15 DIAGNOSIS — D751 Secondary polycythemia: Secondary | ICD-10-CM

## 2019-10-15 DIAGNOSIS — E7800 Pure hypercholesterolemia, unspecified: Secondary | ICD-10-CM | POA: Diagnosis not present

## 2019-10-15 LAB — TSH: TSH: 1.21 u[IU]/mL (ref 0.35–4.50)

## 2019-10-15 LAB — BASIC METABOLIC PANEL
BUN: 13 mg/dL (ref 6–23)
CO2: 26 mEq/L (ref 19–32)
Calcium: 9.5 mg/dL (ref 8.4–10.5)
Chloride: 106 mEq/L (ref 96–112)
Creatinine, Ser: 1.04 mg/dL (ref 0.40–1.50)
GFR: 70.48 mL/min (ref >60.00)
Glucose, Bld: 160 mg/dL — ABNORMAL HIGH (ref 70–99)
Potassium: 3.8 mEq/L (ref 3.5–5.1)
Sodium: 139 mEq/L (ref 135–145)

## 2019-10-15 LAB — CBC
HCT: 45.8 % (ref 39.0–52.0)
Hemoglobin: 15.7 g/dL (ref 13.0–17.0)
MCHC: 34.3 g/dL (ref 30.0–36.0)
MCV: 86.3 fl (ref 78.0–100.0)
Platelets: 181 10*3/uL (ref 150.0–400.0)
RBC: 5.31 Mil/uL (ref 4.22–5.81)
RDW: 14.6 % (ref 11.5–15.5)
WBC: 7.6 10*3/uL (ref 4.0–10.5)

## 2019-10-15 MED ORDER — BISOPROLOL FUMARATE 5 MG PO TABS: 5.0000 mg | ORAL_TABLET | Freq: Every day | ORAL | 3 refills | Status: DC

## 2019-10-15 MED ORDER — METFORMIN HCL 500 MG PO TABS: 500.0000 mg | ORAL_TABLET | Freq: Every day | ORAL | 3 refills | Status: DC

## 2019-10-15 NOTE — Progress Notes (Signed)
OFFICE VISIT  10/15/2019   CC:  Chief Complaint  Patient presents with  . Follow-up    RCI, pt is not fasting   HPI:    Patient is a 69 y.o.  male who presents for 3 mo f/u prediabetes, HLD, Hypothyroidism. A/P as of last visit: "1) Prediabetes: really needs to get started with TLC. Hba1c and fasting glucose today.  2) HLD: tolerating statin.  LDL 77, hepatic panel normal 4 mo ago.  Plan repeat lipids 3 mo.  3) Tobacco dependence: high risk for lung cancer obviously. I encouraged him to get lung cancer screening CT but he declined at this time, says he is close to picking a quit date for cessation trial.  He has mild erythrocytosis, suspect from long term smoking.  Recheck CBC today and if stable will plan on q6-12 mo f/u Hb.  4) CAD: asympt.  Preserved LV function.  Continue ASA, BB, statin.  Keep approp cardiology f/u.  5) Daytime somnolence: he feels like this is less of a problem compared to the last 6 mo. Says sleep feels restorative, no morning HAs.  No witnessed apnea in sleep. No probs with fighting off sleep while driving.  Main issue is falling asleep easily after suppertime. He continues to decline referral for OSA eval today"  INTERIM HX: Feeling fine but he reiterates that his primary issue remains SIGNIFICANT excessive daytime somnolence if he sits for any amount of time--going on for a year or more. If he gets up and gets moving he says he is fine. Says he feels like he sleeps all night, feels like he rested fine.  No probs with sleep initiation when he goes to bed at night.  No restless legs.  He says his wife does not comment on any apneic events in his sleep but he admits he has never asked her.  No waking up with a gasp.  No morning HAs. His sx's have not changed any with a switch in his beta blocker. He does continue to smoke, says he is pushing himself to try to quit gradually. No CP, no SOB, no focal weakness, no paresthesias.    ROS: no fevers, no  wheezing, no cough, no dizziness, no HAs, no rashes, no melena/hematochezia.  No polyuria or polydipsia.  No myalgias or arthralgias.  No focal weakness, paresthesias, or tremors.  No acute vision or hearing abnormalities. No n/v/d or abd pain.  No palpitations.     Past Medical History:  Diagnosis Date  . Allergy   . Aortic root enlargement (HCC)    CTA chest/aorta 04/2017:  Mild enlargement of aortic root, measuring 3.9 x 3.8 x 3.6 cm.  . Arthralgia of both knees 09/2015   Plain films with minimal DJD changes medial compartment, o/w nl.  . Arthritis   . Claudication ([place])    Normal ABI's in 2006  . Coronary artery disease    s/p stents  . 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  Rpt u/s 3 yrs.  . Fatigue due to treatment    better when lopressor dose was decreased to 12.5mg  bid 04/2017.  Marland Kitchen Hearing loss    Bilat sensorineural; [facility] ENT eval 07/2019; pt candidate for cochlear implants  . History of adenomatous polyp of colon 05/19/2016   Recall 04/2021 (Dr. Havery Moros)  . Hyperlipidemia   . Hypothyroidism   . Nephrolithiasis    passed one stone approx 2008; no prob since (saw  urologist briefly)  . Normal nuclear stress test 2012  . Prediabetes 02/2016   HbA1c 6.1%.  2019 A1c 6.3%.  . Prostatitis    When pt in 42s and 59s; saw Dr. Gaynelle Arabian and eventually got a TURP per pt's description.  No probs since then.  . Recurrent sinusitis    Sinus plain films 08/2014: bilat maxillary sinusitis (chronic)  . S/P coronary artery stent placement 2005   RCA and LCX.  As of 02/2018 cardiology f/u, plan is to get ETT (pt doing fine, but was not having chest pain prior to last stent placement).  . Tobacco dependence    current as of 01/2019    Past Surgical History:  Procedure Laterality Date  . Aortic ultrasound  09/07/2018   Ectatic abd aorta (3.4 cm max diameter) but no aneurism.  Rpt u/s 3 yrs.  . APPENDECTOMY    . CARDIAC  CATHETERIZATION  04/16/2003   NORMAL. EF 60%. TOTALLY OCCLUDED LEFT CIRCUMFLEX WITH SEVERE DISEASE, AND ULCERATED PLAQUE IN THE PROXIMAL RIGHT CORONARY ARTERY AND MILD ATHERSCLEROSIS IN THE LEFT ANTERIOR DESCENDING  . CARDIOVASCULAR STRESS TEST  2009;2012; 10/2014; 03/2018   2012 normal nuclear stress test.  2016 ETT no ischemia.  ETT normal 03/2018.  Marland Kitchen COLONOSCOPY W/ POLYPECTOMY  05/19/2016   Multiple polyps--tubular adenoma.  Recall 5 yrs (04/2021).  . CORONARY STENT PLACEMENT  04/19/2003   STENT PLACEMENT IN THE LEFT CIRCUMFLEX CORONARY AND RIGHT CORONARY ARTERY. MILD RESIDUAL STENOSIS IN THE MID PORTION OF THE LEFT ANTERIOR DESCENDING  . TRANSTHORACIC ECHOCARDIOGRAM  05/03/2017   EF 60-65%, normal valves, mildly enlarged aortic root.    Outpatient Medications Prior to Visit  Medication Sig Dispense Refill  . aspirin 81 MG tablet Take 81 mg by mouth daily.      Marland Kitchen atorvastatin (LIPITOR) 80 MG tablet TAKE 1 TABLET BY MOUTH EVERY DAY 90 tablet 3  . Cyanocobalamin (VITAMIN B-12 PO) Take 1 tablet by mouth daily.    . fluticasone (FLONASE) 50 MCG/ACT nasal spray Place 2 sprays into both nostrils daily. 16 g 11  . levothyroxine (SYNTHROID) 112 MCG tablet TAKE 1 TABLET BY MOUTH EVERY DAY 90 tablet 0  . montelukast (SINGULAIR) 10 MG tablet TAKE 1 TABLET BY MOUTH EVERYDAY AT BEDTIME 90 tablet 0  . bisoprolol (ZEBETA) 5 MG tablet TAKE 1 TABLET BY MOUTH EVERY DAY 90 tablet 0  . metFORMIN (GLUCOPHAGE) 500 MG tablet Take 1 tablet (500 mg total) by mouth daily with supper. OFFICE VISIT NEEDED 30 tablet 0   Facility-Administered Medications Prior to Visit  Medication Dose Route Frequency Provider Last Rate Last Admin  . 0.9 %  sodium chloride infusion  500 mL Intravenous Continuous Armbruster, Carlota Raspberry, MD        Allergies  Allergen Reactions  . Plavix [Clopidogrel Bisulfate] Rash    ROS As per HPI  PE: Blood pressure 113/69, pulse 62, temperature 97.9 F (36.6 C), temperature source Oral, resp.  rate 16, height 5\' 10"  (1.778 m), weight 196 lb 9.6 oz (89.2 kg), SpO2 97 %. Gen: Alert, well appearing.  Patient is oriented to person, place, time, and situation. AFFECT: pleasant, lucid thought and speech. KDT:OIZT: no injection, icteris, swelling, or exudate.  EOMI, PERRLA. Mouth: lips without lesion/swelling.  Oral mucosa pink and moist. Oropharynx without erythema, exudate, or swelling.  CV: RRR, no m/r/g.   LUNGS: CTA bilat, nonlabored resps, good aeration in all lung fields. EXT: no clubbing or cyanosis.  no edema.    LABS:  Lab Results  Component Value Date   TSH 3.08 02/01/2019   Lab Results  Component Value Date   WBC 8.6 06/06/2019   HGB 16.6 06/06/2019   HCT 49.3 06/06/2019   MCV 86.6 06/06/2019   PLT 193 06/06/2019   Lab Results  Component Value Date   CREATININE 0.89 06/06/2019   BUN 12 06/06/2019   NA 141 06/06/2019   K 4.1 06/06/2019   CL 106 06/06/2019   CO2 24 06/06/2019   Lab Results  Component Value Date   ALT 24 02/01/2019   AST 18 02/01/2019   ALKPHOS 91 02/01/2019   BILITOT 0.9 02/01/2019   Lab Results  Component Value Date   CHOL 147 02/01/2019   Lab Results  Component Value Date   HDL 44.10 02/01/2019   Lab Results  Component Value Date   LDLCALC 77 02/01/2019   Lab Results  Component Value Date   TRIG 132.0 02/01/2019   Lab Results  Component Value Date   CHOLHDL 3 02/01/2019   Lab Results  Component Value Date   PSA 1.48 02/01/2019   PSA 1.34 03/18/2016   Lab Results  Component Value Date   HGBA1C 5.6 06/06/2019    IMPRESSION AND PLAN:  1) Hypersomnolence: refer to neurology/sleep MD for further eval, suspect OSA. Hx of erythrocytosis that is likely secondary to this. This feeling has not improved with switch from metop to bisoprolol. CBC today to monitor Hb.  2) Prediabetes: well controlled on metformin 500mg  qhs. A1c, lytes/cr today.  3) HLD: tolerating atorva 80mg  qd well. Not fasting today so I'll not do  lipids. Hepatic panel normal 01/2019.  4) Hypothyroidism: taking T4 correctly. TSH today.  5) Tobacco dependence: he is trying to motivate himself to make better effort at cessation.  6) CAD; asymptomatic.  Continue bystolic, atorva, ASA, and appropriate cardiology f/u.  An After Visit Summary was printed and given to the patient.  FOLLOW UP: Return in about 6 months (around 04/16/2020) for annual CPE (fasting).  Signed:  Crissie Sickles, MD           10/15/2019

## 2019-10-16 ENCOUNTER — Other Ambulatory Visit: Payer: Self-pay

## 2019-10-17 ENCOUNTER — Telehealth: Payer: Self-pay

## 2019-10-17 NOTE — Telephone Encounter (Signed)
Spoke with patient and he did not have any other questions regarding recent lab results. He asked if a referral had been done for OSA. Advised it was done on 7/26 and he should receive a call within 7-10 business days for scheduling details.  Client [facility] - Client Client Site [facility] - Day Physician Crissie Sickles - MD Contact Type Call Who Is Calling Patient / Member / Family / Caregiver Caller Name Royden Bulman Caller Phone Number [phone] and [phone] Patient Name Lawrence Hunter Patient DOB 1950/12/02 Call Type Message Only Information Provided Reason for Call Request for Lab/Test Results Initial Comment Caller states he would like to speak with the office regarding his lab results. Disp. Time Disposition Final User 10/16/2019 5:11:19 PM General Information Provided Yes [facility] [place] Call Closed By: Rachel Moulds [place] Transaction Date/Time: 10/16/2019 5:07:07 PM (ET)

## 2019-11-16 ENCOUNTER — Other Ambulatory Visit: Payer: Self-pay | Admitting: Family Medicine

## 2019-12-08 ENCOUNTER — Other Ambulatory Visit: Payer: Self-pay | Admitting: Family Medicine

## 2019-12-08 DIAGNOSIS — J309 Allergic rhinitis, unspecified: Secondary | ICD-10-CM

## 2020-02-10 ENCOUNTER — Other Ambulatory Visit: Payer: Self-pay | Admitting: Family Medicine

## 2020-02-21 ENCOUNTER — Telehealth: Payer: Self-pay | Admitting: Family Medicine

## 2020-02-21 NOTE — Telephone Encounter (Signed)
Left message for patient to schedule Annual Wellness Visit.  Please schedule with Nurse Health Advisor Martha Stanley, RN at [facility]  °

## 2020-02-22 ENCOUNTER — Other Ambulatory Visit: Payer: Self-pay | Admitting: Cardiovascular Disease

## 2020-02-22 DIAGNOSIS — E7800 Pure hypercholesterolemia, unspecified: Secondary | ICD-10-CM

## 2020-02-27 DIAGNOSIS — M79672 Pain in left foot: Secondary | ICD-10-CM | POA: Diagnosis not present

## 2020-02-27 DIAGNOSIS — M722 Plantar fascial fibromatosis: Secondary | ICD-10-CM | POA: Diagnosis not present

## 2020-03-03 ENCOUNTER — Other Ambulatory Visit: Payer: Self-pay | Admitting: Family Medicine

## 2020-03-03 DIAGNOSIS — J309 Allergic rhinitis, unspecified: Secondary | ICD-10-CM

## 2020-05-04 NOTE — Progress Notes (Signed)
Chief Complaint  Patient presents with  . Follow-up    CAD    History of Present Illness: 70 yo male with history of CAD, HLD, tobacco abuse and hypothyroidism here today for cardiac follow up. He had a NSTEMI in January 2005 and had a Cypher drug eluting stent placed in the occluded Circumflex and a Cypher drug eluting stent placed in the RCA. He has had no further caths since then. Stress myoview in July 2012 showed no ischemia. Exercise stress test August 2016 with no ischemia. Echo February 2019 with LVEF=60-65%, LVH. The aortic root was mildly dilated. Chest CTA with no evidence of aneurysm or dissection of the aorta. Exercise stress test January 2020 with no ischemic EKG changes.   He is here today for follow up. The patient denies any chest pain, dyspnea, palpitations, lower extremity edema, orthopnea, PND, dizziness, near syncope or syncope.   Primary Care Physician: Tammi Sou, MD  Past Medical History:  Diagnosis Date  . Allergy   . Aortic root enlargement (HCC)    CTA chest/aorta 04/2017:  Mild enlargement of aortic root, measuring 3.9 x 3.8 x 3.6 cm.  . Arthralgia of both knees 09/2015   Plain films with minimal DJD changes medial compartment, o/w nl.  . Arthritis   . Claudication ([place])    Normal ABI's in 2006  . Coronary artery disease    s/p stents  . 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  Rpt u/s 3 yrs.  . Fatigue due to treatment    better when lopressor dose was decreased to 12.5mg  bid 04/2017.  Marland Kitchen Hearing loss    Bilat sensorineural; [facility] ENT eval 07/2019; pt candidate for cochlear implants  . History of adenomatous polyp of colon 05/19/2016   Recall 04/2021 (Dr. Havery Moros)  . Hyperlipidemia   . Hypothyroidism   . Nephrolithiasis    passed one stone approx 2008; no prob since (saw urologist briefly)  . Normal nuclear stress test 2012  . Prediabetes 02/2016   HbA1c 6.1%.  2019 A1c 6.3%.  .  Prostatitis    When pt in 66s and 35s; saw Dr. Gaynelle Arabian and eventually got a TURP per pt's description.  No probs since then.  . Recurrent sinusitis    Sinus plain films 08/2014: bilat maxillary sinusitis (chronic)  . S/P coronary artery stent placement 2005   RCA and LCX.  As of 02/2018 cardiology f/u, plan is to get ETT (pt doing fine, but was not having chest pain prior to last stent placement).  . Tobacco dependence    current as of 01/2019    Past Surgical History:  Procedure Laterality Date  . Aortic ultrasound  09/07/2018   Ectatic abd aorta (3.4 cm max diameter) but no aneurism.  Rpt u/s 3 yrs.  . APPENDECTOMY    . CARDIAC CATHETERIZATION  04/16/2003   NORMAL. EF 60%. TOTALLY OCCLUDED LEFT CIRCUMFLEX WITH SEVERE DISEASE, AND ULCERATED PLAQUE IN THE PROXIMAL RIGHT CORONARY ARTERY AND MILD ATHERSCLEROSIS IN THE LEFT ANTERIOR DESCENDING  . CARDIOVASCULAR STRESS TEST  2009;2012; 10/2014; 03/2018   2012 normal nuclear stress test.  2016 ETT no ischemia.  ETT normal 03/2018.  Marland Kitchen COLONOSCOPY W/ POLYPECTOMY  05/19/2016   Multiple polyps--tubular adenoma.  Recall 5 yrs (04/2021).  . CORONARY STENT PLACEMENT  04/19/2003   STENT PLACEMENT IN THE LEFT CIRCUMFLEX CORONARY AND RIGHT CORONARY ARTERY. MILD RESIDUAL STENOSIS IN THE MID PORTION OF THE  LEFT ANTERIOR DESCENDING  . TRANSTHORACIC ECHOCARDIOGRAM  05/03/2017   EF 60-65%, normal valves, mildly enlarged aortic root.    Current Outpatient Medications  Medication Sig Dispense Refill  . aspirin 81 MG tablet Take 81 mg by mouth daily.    Marland Kitchen atorvastatin (LIPITOR) 80 MG tablet TAKE 1 TABLET BY MOUTH EVERY DAY 90 tablet 0  . bisoprolol (ZEBETA) 5 MG tablet Take 1 tablet (5 mg total) by mouth daily. 90 tablet 3  . Cyanocobalamin (VITAMIN B-12 PO) Take 1 tablet by mouth daily.    . fluticasone (FLONASE) 50 MCG/ACT nasal spray Place 2 sprays into both nostrils daily. 16 g 11  . levothyroxine (SYNTHROID) 112 MCG tablet TAKE 1 TABLET BY MOUTH EVERY  DAY 90 tablet 1  . metFORMIN (GLUCOPHAGE) 500 MG tablet Take 1 tablet (500 mg total) by mouth daily with supper. 90 tablet 3  . montelukast (SINGULAIR) 10 MG tablet TAKE 1 TABLET BY MOUTH EVERYDAY AT BEDTIME 90 tablet 0   Current Facility-Administered Medications  Medication Dose Route Frequency Provider Last Rate Last Admin  . 0.9 %  sodium chloride infusion  500 mL Intravenous Continuous Armbruster, Carlota Raspberry, MD        Allergies  Allergen Reactions  . Plavix [Clopidogrel Bisulfate] Rash    Social History   Socioeconomic History  . Marital status: Married    Spouse name: Not on file  . Number of children: 2  . Years of education: Not on file  . Highest education level: Not on file  Occupational History  . Occupation: Press photographer Rep  Tobacco Use  . Smoking status: Current Every Day Smoker    Packs/day: 1.00    Years: 30.00    Pack years: 30.00    Types: Cigarettes  . Smokeless tobacco: Never Used  Vaping Use  . Vaping Use: Never used  Substance and Sexual Activity  . Alcohol use: No  . Drug use: No  . Sexual activity: Yes  Other Topics Concern  . Not on file  Social History Narrative   Married, 1 son in [place] and one in [place].   Lives in [place].   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   Tob: 40 pack-yr hx, current as of 04/2014.   Alcohol: social/rare.   Social Determinants of Health   Financial Resource Strain: Not on file  Food Insecurity: Not on file  Transportation Needs: Not on file  Physical Activity: Not on file  Stress: Not on file  Social Connections: Not on file  Intimate Partner Violence: Not on file    Family History  Problem Relation Age of Onset  . Hypertension Mother   . Stroke Mother   . Hypertension Father   . Stroke Father   . Prostate cancer Father   . Heart attack Brother        STENT  . Colon cancer Neg Hx     Review of Systems:  As stated in the HPI and otherwise  negative.   BP 130/70   Pulse 63   Ht 5\' 10"  (1.778 m)   Wt 200 lb (90.7 kg)   SpO2 98%   BMI 28.70 kg/m   Physical Examination: General: Well developed, well nourished, NAD  HEENT: OP clear, mucus membranes moist  SKIN: warm, dry. No rashes. Neuro: No focal deficits  Musculoskeletal: Muscle strength 5/5 all ext  Psychiatric: Mood and affect normal  Neck: No JVD, no carotid bruits, no  thyromegaly, no lymphadenopathy.  Lungs:Clear bilaterally, no wheezes, rhonci, crackles Cardiovascular: Regular rate and rhythm. No murmurs, gallops or rubs. Abdomen:Soft. Bowel sounds present. Non-tender.  Extremities: No lower extremity edema. Pulses are 2 + in the bilateral DP/PT.  Echo February 2019: Left ventricle: The cavity size was normal. There was severe   focal basal and moderate concentric hypertrophy. Systolic   function was normal. The estimated ejection fraction was in the   range of 60% to 65%. Wall motion was normal; there were no   regional wall motion abnormalities. Left ventricular diastolic   function parameters were normal. - Aortic valve: Moderate focal calcification involving the   noncoronary cusp. - Aorta: Aortic root dimension: 42 mm (ED). Ascending aortic   diameter: 39 mm (S). - Aortic root: The aortic root was mildly dilated. - Ascending aorta: The ascending aorta was mildly dilated. - Left atrium: The atrium was moderately dilated.  EKG:  EKG is ordered today. The ekg ordered today demonstrates  sinus  Recent Labs: 10/15/2019: BUN 13; Creatinine, Ser 1.04; Hemoglobin 15.7; Platelets 181.0; Potassium 3.8; Sodium 139; TSH 1.21   Lipid Panel    Component Value Date/Time   CHOL 147 02/01/2019 0935   CHOL 129 04/25/2018 0820   TRIG 132.0 02/01/2019 0935   HDL 44.10 02/01/2019 0935   HDL 39 (L) 04/25/2018 0820   CHOLHDL 3 02/01/2019 0935   VLDL 26.4 02/01/2019 0935   LDLCALC 77 02/01/2019 0935   LDLCALC 71 04/25/2018 0820     Wt Readings from Last 3  Encounters:  05/05/20 200 lb (90.7 kg)  10/15/19 196 lb 9.6 oz (89.2 kg)  06/06/19 197 lb 6.4 oz (89.5 kg)     Other studies Reviewed: Additional studies/ records that were reviewed today include: . Review of the above records demonstrates:    Assessment and Plan:   1. CAD without angina: LV function normal by echo in February 2019. Normal exercise stress test January 2020. No chest pain. Continue ASA, statin and beta blocker. Check BMET today  2. Tobacco abuse: Smoking cessation encouraged  3. Hyperlipidemia: LDL near goal in November 2020. Continue statin. Will check lipids and LFTS now   Current medicines are reviewed at length with the patient today.  The patient does not have concerns regarding medicines.  The following changes have been made:  no change  Labs/ tests ordered today include:   Orders Placed This Encounter  Procedures  . Basic metabolic panel  . Lipid panel  . Hepatic function panel  . EKG 12-Lead    Disposition:   FU with me in 12 months  Signed, Lauree Chandler, MD 05/05/2020 9:35 AM    [facility] [address], [place], [place]  [address] Phone: [phone]; Fax: [phone]

## 2020-05-05 ENCOUNTER — Other Ambulatory Visit: Payer: Self-pay

## 2020-05-05 ENCOUNTER — Ambulatory Visit: Payer: PPO | Admitting: Cardiovascular Disease

## 2020-05-05 ENCOUNTER — Encounter: Payer: Self-pay | Admitting: Cardiovascular Disease

## 2020-05-05 VITALS — BP 130/70 | HR 63 | Ht 70.0 in | Wt 200.0 lb

## 2020-05-05 DIAGNOSIS — E7800 Pure hypercholesterolemia, unspecified: Secondary | ICD-10-CM

## 2020-05-05 DIAGNOSIS — I2510 Atherosclerotic heart disease of native coronary artery without angina pectoris: Secondary | ICD-10-CM | POA: Diagnosis not present

## 2020-05-05 DIAGNOSIS — Z720 Tobacco use: Secondary | ICD-10-CM | POA: Diagnosis not present

## 2020-05-05 LAB — BASIC METABOLIC PANEL
BUN/Creatinine Ratio: 17 (ref 10–24)
BUN: 16 mg/dL (ref 8–27)
CO2: 23 mmol/L (ref 20–29)
Calcium: 9.7 mg/dL (ref 8.6–10.2)
Chloride: 102 mmol/L (ref 96–106)
Creatinine, Ser: 0.94 mg/dL (ref 0.76–1.27)
GFR calc Af Amer: 94 mL/min/{1.73_m2} (ref >59)
GFR calc non Af Amer: 81 mL/min/{1.73_m2} (ref >59)
Glucose: 101 mg/dL — ABNORMAL HIGH (ref 65–99)
Potassium: 4.3 mmol/L (ref 3.5–5.2)
Sodium: 140 mmol/L (ref 134–144)

## 2020-05-05 LAB — LIPID PANEL
Chol/HDL Ratio: 3.4 ratio (ref 0.0–5.0)
Cholesterol, Total: 152 mg/dL (ref 100–199)
HDL: 45 mg/dL (ref >39)
LDL Chol Calc (NIH): 85 mg/dL (ref 0–99)
Triglycerides: 125 mg/dL (ref 0–149)
VLDL Cholesterol Cal: 22 mg/dL (ref 5–40)

## 2020-05-05 LAB — HEPATIC FUNCTION PANEL
ALT: 27 IU/L (ref 0–44)
AST: 21 IU/L (ref 0–40)
Albumin: 4.6 g/dL (ref 3.7–4.7)
Alkaline Phosphatase: 121 IU/L (ref 44–121)
Bilirubin Total: 0.6 mg/dL (ref 0.0–1.2)
Bilirubin, Direct: 0.18 mg/dL (ref 0.00–0.40)
Total Protein: 6.8 g/dL (ref 6.0–8.5)

## 2020-05-05 NOTE — Patient Instructions (Addendum)
.   Medication Instructions:  No changes *If you need a refill on your cardiac medications before your next appointment, please call your pharmacy*   Lab Work: Today: BMET, Lipids/Liver Function  If you have labs (blood work) drawn today and your tests are completely normal, you will receive your results only by: Marland Kitchen MyChart Message (if you have MyChart) OR . A paper copy in the mail If you have any lab test that is abnormal or we need to change your treatment, we will call you to review the results.   Testing/Procedures: none   Follow-Up: At [facility], you and your health needs are our priority.  As part of our continuing mission to provide you with exceptional heart care, we have created designated Provider Care Teams.  These Care Teams include your primary Cardiologist (physician) and Advanced Practice Providers (APPs -  Physician Assistants and Nurse Practitioners) who all work together to provide you with the care you need, when you need it.  Your next appointment:   12 month(s)  The format for your next appointment:   In Person  Provider:   You may see Lauree Chandler, MD or one of the following Advanced Practice Providers on your designated Care Team:    Melina Copa, PA-C  Ermalinda Barrios, PA-C    Other Instructions

## 2020-05-06 ENCOUNTER — Telehealth: Payer: Self-pay | Admitting: *Deleted

## 2020-05-06 DIAGNOSIS — I2510 Atherosclerotic heart disease of native coronary artery without angina pectoris: Secondary | ICD-10-CM

## 2020-05-06 DIAGNOSIS — E7800 Pure hypercholesterolemia, unspecified: Secondary | ICD-10-CM

## 2020-05-06 MED ORDER — EZETIMIBE 10 MG PO TABS: 10.0000 mg | ORAL_TABLET | Freq: Every day | ORAL | 3 refills | Status: DC

## 2020-05-06 NOTE — Telephone Encounter (Signed)
Reviewed results and recommendations with patient.  He will begin zetia and repeat lipids in 3 months.  Appointment scheduled.

## 2020-05-06 NOTE — Telephone Encounter (Signed)
-----   Message from Burnell Blanks, MD sent at 05/06/2020  8:04 AM EST ----- BMET ok. Renal function normal. LFTs ok. LDL not at goal of 70 but not far off. Continue statin. Can we see if he would be willing to start Zetia also at 10 mg daily. This should not have any side effects and when added to the statin it may get him down to goal LDL of 70. Gerald Stabs

## 2020-05-16 ENCOUNTER — Other Ambulatory Visit: Payer: Self-pay | Admitting: Cardiovascular Disease

## 2020-05-16 DIAGNOSIS — E7800 Pure hypercholesterolemia, unspecified: Secondary | ICD-10-CM

## 2020-05-27 ENCOUNTER — Other Ambulatory Visit: Payer: Self-pay | Admitting: Family Medicine

## 2020-05-27 DIAGNOSIS — J309 Allergic rhinitis, unspecified: Secondary | ICD-10-CM

## 2020-06-21 ENCOUNTER — Other Ambulatory Visit: Payer: Self-pay | Admitting: Family Medicine

## 2020-06-21 DIAGNOSIS — J309 Allergic rhinitis, unspecified: Secondary | ICD-10-CM

## 2020-07-15 ENCOUNTER — Other Ambulatory Visit: Payer: Self-pay

## 2020-07-15 ENCOUNTER — Emergency Department
Admission: EM | Admit: 2020-07-15 | Discharge: 2020-07-15 | Disposition: A | Discharge status: Home / Self Care | Payer: PPO | Source: Home / Self Care

## 2020-07-15 ENCOUNTER — Encounter: Payer: Self-pay | Admitting: Emergency Medicine

## 2020-07-15 ENCOUNTER — Telehealth: Payer: Self-pay

## 2020-07-15 DIAGNOSIS — R109 Unspecified abdominal pain: Secondary | ICD-10-CM | POA: Diagnosis not present

## 2020-07-15 DIAGNOSIS — J309 Allergic rhinitis, unspecified: Secondary | ICD-10-CM

## 2020-07-15 DIAGNOSIS — R300 Dysuria: Secondary | ICD-10-CM

## 2020-07-15 LAB — POCT URINALYSIS DIP (MANUAL ENTRY)
Glucose, UA: NEGATIVE mg/dL
Ketones, POC UA: NEGATIVE mg/dL
Leukocytes, UA: NEGATIVE
Nitrite, UA: NEGATIVE
Protein Ur, POC: 30 mg/dL — AB
Spec Grav, UA: >=1.03 — AB (ref 1.010–1.025)
Urobilinogen, UA: 0.2 E.U./dL
pH, UA: 5.5 (ref 5.0–8.0)

## 2020-07-15 MED ORDER — TAMSULOSIN HCL 0.4 MG PO CAPS: 0.4000 mg | ORAL_CAPSULE | Freq: Every day | ORAL | 0 refills | Status: DC

## 2020-07-15 MED ORDER — ACETAMINOPHEN-CODEINE #3 300-30 MG PO TABS: 1.0000 | ORAL_TABLET | Freq: Four times a day (QID) | ORAL | 0 refills | Status: DC | PRN

## 2020-07-15 MED ORDER — MONTELUKAST SODIUM 10 MG PO TABS: ORAL_TABLET | ORAL | 0 refills | Status: DC

## 2020-07-15 NOTE — Telephone Encounter (Signed)
Rx sent for 30 day supply. Pt was advised. Please see FYI below regarding appt

## 2020-07-15 NOTE — Discharge Instructions (Addendum)
I sent in Flomax for you to take 1 tablet daily  I have sent in Tylenol 3 for you to take for pain as needed  I think you have a kidney stone, your urine was clean and there is no infection today.  If your pain gets worse, you have the inability to void, you have high fever, if you notice that you are peeing blood, I would have you follow-up in the ER

## 2020-07-15 NOTE — Telephone Encounter (Signed)
Noted  

## 2020-07-15 NOTE — Telephone Encounter (Signed)
FYI 1)Patient requesting appt today for possible kidney stone. Dr. Anitra Lauth not available, I did attempt to look for another [facility] location close by, only available appts was [facility].  Too far for patient to drive. I advised patient to go to [facility] [place].  He said he knew exactly where office was located.  He is leaving home now to go to [place] clinic.  2) Patient request refill  montelukast (SINGULAIR) 10 MG tablet [[id number]]   [facility]

## 2020-07-15 NOTE — ED Triage Notes (Signed)
3 days of R flank pain  Burning w/ urination Azo yesterday afternoon Hx of kidney stones

## 2020-07-15 NOTE — ED Provider Notes (Signed)
[facility] C[facility]    CSN: [id number] Arrival date & time: 07/15/20  1610      History   Chief Complaint Chief Complaint  Patient presents with  . Flank Pain    right    HPI Lawrence Hunter is a 70 y.o. male.   Reports right flank pain for the last 3 days.  Reports mild dysuria.  Reports an episode of nausea with an episode of pain.  Reports that he is taking Azo with some relief.  Has history of kidney stones.  Has not seen any kidney stones at home yet.  Denies other abdominal pain, gross hematuria, fever, erythema, swelling, discharge, other symptoms  ROS per HPI  The history is provided by the patient.  Flank Pain    Past Medical History:  Diagnosis Date  . Allergy   . Aortic root enlargement (HCC)    CTA chest/aorta 04/2017:  Mild enlargement of aortic root, measuring 3.9 x 3.8 x 3.6 cm.  . Arthralgia of both knees 09/2015   Plain films with minimal DJD changes medial compartment, o/w nl.  . Arthritis   . Claudication ([place])    Normal ABI's in 2006  . Coronary artery disease    s/p stents  . 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  Rpt u/s 3 yrs.  . Fatigue due to treatment    better when lopressor dose was decreased to 12.5mg  bid 04/2017.  Marland Kitchen Hearing loss    Bilat sensorineural; [facility] ENT eval 07/2019; pt candidate for cochlear implants  . History of adenomatous polyp of colon 05/19/2016   Recall 04/2021 (Dr. Havery Moros)  . Hyperlipidemia   . Hypothyroidism   . Nephrolithiasis    passed one stone approx 2008; no prob since (saw urologist briefly)  . Normal nuclear stress test 2012  . Prediabetes 02/2016   HbA1c 6.1%.  2019 A1c 6.3%.  . Prostatitis    When pt in 85s and 61s; saw Dr. Gaynelle Arabian and eventually got a TURP per pt's description.  No probs since then.  . Recurrent sinusitis    Sinus plain films 08/2014: bilat maxillary sinusitis (chronic)  . S/P coronary artery stent placement 2005    RCA and LCX.  As of 02/2018 cardiology f/u, plan is to get ETT (pt doing fine, but was not having chest pain prior to last stent placement).  . Tobacco dependence    current as of 01/2019    Patient Active Problem List   Diagnosis Date Noted  . Overweight (BMI 25.0-29.9) 02/01/2019  . Aortic root enlargement ([place]) 05/05/2017  . Post-nasal drainage 07/01/2016  . Bilateral sensorineural hearing loss 07/01/2016  . Allergic rhinitis 02/14/2015  . Hypothyroidism   . S/P coronary artery stent placement   . Claudication ([place])   . Nephrolithiasis   . Prostatitis   . Colon cancer screening 04/23/2014  . Coronary atherosclerosis 10/09/2010  . Tobacco use disorder 10/09/2010  . Other and unspecified hyperlipidemia 10/09/2010    Past Surgical History:  Procedure Laterality Date  . Aortic ultrasound  09/07/2018   Ectatic abd aorta (3.4 cm max diameter) but no aneurism.  Rpt u/s 3 yrs.  . APPENDECTOMY    . CARDIAC CATHETERIZATION  04/16/2003   NORMAL. EF 60%. TOTALLY OCCLUDED LEFT CIRCUMFLEX WITH SEVERE DISEASE, AND ULCERATED PLAQUE IN THE PROXIMAL RIGHT CORONARY ARTERY AND MILD ATHERSCLEROSIS IN THE LEFT ANTERIOR DESCENDING  . CARDIOVASCULAR STRESS TEST  5784;6962; 10/2014; 03/2018  2012 normal nuclear stress test.  2016 ETT no ischemia.  ETT normal 03/2018.  Marland Kitchen COLONOSCOPY W/ POLYPECTOMY  05/19/2016   Multiple polyps--tubular adenoma.  Recall 5 yrs (04/2021).  . CORONARY STENT PLACEMENT  04/19/2003   STENT PLACEMENT IN THE LEFT CIRCUMFLEX CORONARY AND RIGHT CORONARY ARTERY. MILD RESIDUAL STENOSIS IN THE MID PORTION OF THE LEFT ANTERIOR DESCENDING  . TRANSTHORACIC ECHOCARDIOGRAM  05/03/2017   EF 60-65%, normal valves, mildly enlarged aortic root.       Home Medications    Prior to Admission medications   Medication Sig Start Date End Date Taking? Authorizing Provider  acetaminophen-codeine (TYLENOL #3) 300-30 MG tablet Take 1-2 tablets by mouth every 6 (six) hours as needed for moderate  pain. 07/15/20  Yes Faustino Congress, NP  aspirin 81 MG tablet Take 81 mg by mouth daily.   Yes [provider]  atorvastatin (LIPITOR) 80 MG tablet TAKE 1 TABLET BY MOUTH EVERY DAY 05/16/20  Yes Burnell Blanks, MD  bisoprolol (ZEBETA) 5 MG tablet Take 1 tablet (5 mg total) by mouth daily. 10/15/19  Yes McGowen, Adrian Blackwater, MD  Cyanocobalamin (VITAMIN B-12 PO) Take 1 tablet by mouth daily.   Yes [provider]  fluticasone (FLONASE) 50 MCG/ACT nasal spray Place 2 sprays into both nostrils daily. 04/23/14  Yes McGowen, Adrian Blackwater, MD  levothyroxine (SYNTHROID) 112 MCG tablet TAKE 1 TABLET BY MOUTH EVERY DAY 02/11/20  Yes McGowen, Adrian Blackwater, MD  metFORMIN (GLUCOPHAGE) 500 MG tablet Take 1 tablet (500 mg total) by mouth daily with supper. 10/15/19  Yes McGowen, Adrian Blackwater, MD  montelukast (SINGULAIR) 10 MG tablet TAKE 1 TABLET BY MOUTH EVERYDAY AT BEDTIME. 07/15/20  Yes McGowen, Adrian Blackwater, MD  tamsulosin (FLOMAX) 0.4 MG CAPS capsule Take 1 capsule (0.4 mg total) by mouth daily. 07/15/20  Yes Faustino Congress, NP  ezetimibe (ZETIA) 10 MG tablet Take 1 tablet (10 mg total) by mouth daily. Patient not taking: Reported on 07/15/2020 05/06/20   Burnell Blanks, MD    Family History Family History  Problem Relation Age of Onset  . Hypertension Mother   . Stroke Mother   . Hypertension Father   . Stroke Father   . Prostate cancer Father   . Heart attack Brother        STENT  . Colon cancer Neg Hx     Social History Social History   Tobacco Use  . Smoking status: Current Every Day Smoker    Packs/day: 1.00    Types: Cigarettes  . Smokeless tobacco: Never Used  Vaping Use  . Vaping Use: Never used  Substance Use Topics  . Alcohol use: No  . Drug use: No     Allergies   Plavix [clopidogrel bisulfate]   Review of Systems Review of Systems  Genitourinary: Positive for flank pain.     Physical Exam Triage Vital Signs ED Triage Vitals  Enc Vitals Group      BP 07/15/20 1621 110/64     Pulse Rate 07/15/20 1621 83     Resp 07/15/20 1621 15     Temp 07/15/20 1621 98.7 F (37.1 C)     Temp Source 07/15/20 1621 Oral     SpO2 07/15/20 1621 95 %     Weight --      Height --      Head Circumference --      Peak Flow --      Pain Score 07/15/20 1625 0  Pain Loc --      Pain Edu? --      Excl. in [place]? --    No data found.  Updated Vital Signs BP 110/64   Pulse 83   Temp 98.7 F (37.1 C) (Oral)   Resp 15   SpO2 95%   Visual Acuity Right Eye Distance:   Left Eye Distance:   Bilateral Distance:    Right Eye Near:   Left Eye Near:    Bilateral Near:     Physical Exam Vitals and nursing note reviewed.  Constitutional:      General: He is not in acute distress.    Appearance: Normal appearance. He is well-developed. He is not ill-appearing.  HENT:     Head: Normocephalic and atraumatic.  Eyes:     Extraocular Movements: Extraocular movements intact.     Conjunctiva/sclera: Conjunctivae normal.     Pupils: Pupils are equal, round, and reactive to light.  Cardiovascular:     Rate and Rhythm: Normal rate and regular rhythm.     Heart sounds: No murmur heard.   Pulmonary:     Effort: Pulmonary effort is normal. No respiratory distress.     Breath sounds: Normal breath sounds.  Abdominal:     General: There is no distension.     Palpations: Abdomen is soft. There is no mass.     Tenderness: There is no abdominal tenderness. There is right CVA tenderness. There is no left CVA tenderness, guarding or rebound.     Hernia: No hernia is present.  Musculoskeletal:        General: Normal range of motion.     Cervical back: Normal range of motion and neck supple.  Skin:    General: Skin is warm and dry.     Capillary Refill: Capillary refill takes less than 2 seconds.  Neurological:     General: No focal deficit present.     Mental Status: He is alert and oriented to person, place, and time.  Psychiatric:        Mood and  Affect: Mood normal.        Behavior: Behavior normal.      UC Treatments / Results  Labs (all labs ordered are listed, but only abnormal results are displayed) Labs Reviewed  POCT URINALYSIS DIP (MANUAL ENTRY) - Abnormal; Notable for the following components:      Result Value   Color, UA other (*)    Bilirubin, UA small (*)    Spec Grav, UA >=1.030 (*)    Blood, UA large (*)    Protein Ur, POC =30 (*)    All other components within normal limits    EKG   Radiology No results found.  Procedures Procedures (including critical care time)  Medications Ordered in UC Medications - No data to display  Initial Impression / Assessment and Plan / UC Course  I have reviewed the triage vital signs and the nursing notes.  Pertinent labs & imaging results that were available during my care of the patient were reviewed by me and considered in my medical decision making (see chart for details).    Right flank pain Dysuria  UA unremarkable for infection in office today Suspect kidney stone Prescribed Flomax Prescribed Tylenol No. 3 as needed for breakthrough pain Follow-up in the ER for acute worsening pain, gross hematuria, inability to void, other concerning symptoms  Final Clinical Impressions(s) / UC Diagnoses   Final diagnoses:  Right flank pain  Dysuria  Discharge Instructions     I sent in Flomax for you to take 1 tablet daily  I have sent in Tylenol 3 for you to take for pain as needed  I think you have a kidney stone, your urine was clean and there is no infection today.  If your pain gets worse, you have the inability to void, you have high fever, if you notice that you are peeing blood, I would have you follow-up in the ER    ED Prescriptions    Medication Sig Dispense Auth. Provider   tamsulosin (FLOMAX) 0.4 MG CAPS capsule Take 1 capsule (0.4 mg total) by mouth daily. 30 capsule Faustino Congress, NP   acetaminophen-codeine (TYLENOL #3) 300-30  MG tablet Take 1-2 tablets by mouth every 6 (six) hours as needed for moderate pain. 15 tablet Faustino Congress, NP     I have reviewed the PDMP during this encounter.   Faustino Congress, NP 07/15/20 1649

## 2020-08-06 ENCOUNTER — Other Ambulatory Visit: Payer: Self-pay | Admitting: Family Medicine

## 2020-08-06 DIAGNOSIS — J309 Allergic rhinitis, unspecified: Secondary | ICD-10-CM

## 2020-08-12 ENCOUNTER — Other Ambulatory Visit: Payer: Self-pay | Admitting: Family Medicine

## 2020-08-13 ENCOUNTER — Other Ambulatory Visit: Payer: Self-pay

## 2020-08-13 ENCOUNTER — Other Ambulatory Visit: Payer: PPO | Admitting: *Deleted

## 2020-08-13 DIAGNOSIS — I2510 Atherosclerotic heart disease of native coronary artery without angina pectoris: Secondary | ICD-10-CM

## 2020-08-13 DIAGNOSIS — E7800 Pure hypercholesterolemia, unspecified: Secondary | ICD-10-CM | POA: Diagnosis not present

## 2020-08-13 LAB — LIPID PANEL
Chol/HDL Ratio: 2.8 ratio (ref 0.0–5.0)
Cholesterol, Total: 110 mg/dL (ref 100–199)
HDL: 39 mg/dL — ABNORMAL LOW (ref >39)
LDL Chol Calc (NIH): 55 mg/dL (ref 0–99)
Triglycerides: 78 mg/dL (ref 0–149)
VLDL Cholesterol Cal: 16 mg/dL (ref 5–40)

## 2020-08-13 NOTE — Telephone Encounter (Signed)
Pt wants to know if he's supposed to continue to take the Ezetimibe medication.  He would like for the nurse to contact him as soon as possible.

## 2020-08-14 ENCOUNTER — Telehealth: Payer: Self-pay | Admitting: Cardiovascular Disease

## 2020-08-14 ENCOUNTER — Other Ambulatory Visit: Payer: Self-pay | Admitting: Family Medicine

## 2020-08-14 NOTE — Telephone Encounter (Signed)
Patient has been notified to continue zetia.  He has refills at pharmacy.

## 2020-08-14 NOTE — Telephone Encounter (Signed)
Lipids were just checked and are at goal per Dr. Angelena Form.   Pt made aware via MyChart to continue atorvastatin and ezetimibe.

## 2020-08-14 NOTE — Telephone Encounter (Signed)
PT WOULD LIKE TO KNOW IF HE SHOULD CONTINUE TAKING ezetimibe (ZETIA) 10 MG tablet

## 2020-08-21 ENCOUNTER — Other Ambulatory Visit: Payer: Self-pay

## 2020-08-21 ENCOUNTER — Telehealth: Payer: Self-pay

## 2020-08-21 DIAGNOSIS — J309 Allergic rhinitis, unspecified: Secondary | ICD-10-CM

## 2020-08-21 MED ORDER — MONTELUKAST SODIUM 10 MG PO TABS: ORAL_TABLET | ORAL | 1 refills | Status: DC

## 2020-08-21 NOTE — Telephone Encounter (Signed)
Rx sent, pt has been made aware.

## 2020-08-21 NOTE — Telephone Encounter (Signed)
Patient refill request.  Patient scheduled appt for CPE on 09/10/20. Patient needs meds until appt  montelukast (SINGULAIR) 10 MG tablet [[id number]]    [facility]

## 2020-09-10 ENCOUNTER — Ambulatory Visit (INDEPENDENT_AMBULATORY_CARE_PROVIDER_SITE_OTHER): Payer: PPO | Admitting: Family Medicine

## 2020-09-10 ENCOUNTER — Other Ambulatory Visit: Payer: Self-pay

## 2020-09-10 ENCOUNTER — Encounter: Payer: Self-pay | Admitting: Family Medicine

## 2020-09-10 VITALS — BP 111/67 | HR 62 | Temp 97.8°F | Ht 69.09 in | Wt 187.0 lb

## 2020-09-10 DIAGNOSIS — F17200 Nicotine dependence, unspecified, uncomplicated: Secondary | ICD-10-CM

## 2020-09-10 DIAGNOSIS — E039 Hypothyroidism, unspecified: Secondary | ICD-10-CM | POA: Diagnosis not present

## 2020-09-10 DIAGNOSIS — Z0000 Encounter for general adult medical examination without abnormal findings: Secondary | ICD-10-CM | POA: Diagnosis not present

## 2020-09-10 DIAGNOSIS — E7800 Pure hypercholesterolemia, unspecified: Secondary | ICD-10-CM | POA: Diagnosis not present

## 2020-09-10 DIAGNOSIS — R7303 Prediabetes: Secondary | ICD-10-CM

## 2020-09-10 LAB — CBC WITH DIFFERENTIAL/PLATELET
Basophils Absolute: 0.1 10*3/uL (ref 0.0–0.1)
Basophils Relative: 0.7 % (ref 0.0–3.0)
Eosinophils Absolute: 0.2 10*3/uL (ref 0.0–0.7)
Eosinophils Relative: 2.4 % (ref 0.0–5.0)
HCT: 46.6 % (ref 39.0–52.0)
Hemoglobin: 15.8 g/dL (ref 13.0–17.0)
Lymphocytes Relative: 37.6 % (ref 12.0–46.0)
Lymphs Abs: 3.1 10*3/uL (ref 0.7–4.0)
MCHC: 34 g/dL (ref 30.0–36.0)
MCV: 86.7 fl (ref 78.0–100.0)
Monocytes Absolute: 0.6 10*3/uL (ref 0.1–1.0)
Monocytes Relative: 7 % (ref 3.0–12.0)
Neutro Abs: 4.3 10*3/uL (ref 1.4–7.7)
Neutrophils Relative %: 52.3 % (ref 43.0–77.0)
Platelets: 178 10*3/uL (ref 150.0–400.0)
RBC: 5.37 Mil/uL (ref 4.22–5.81)
RDW: 14.9 % (ref 11.5–15.5)
WBC: 8.2 10*3/uL (ref 4.0–10.5)

## 2020-09-10 LAB — COMPREHENSIVE METABOLIC PANEL
ALT: 21 U/L (ref 0–53)
AST: 19 U/L (ref 0–37)
Albumin: 4.3 g/dL (ref 3.5–5.2)
Alkaline Phosphatase: 99 U/L (ref 39–117)
BUN: 14 mg/dL (ref 6–23)
CO2: 24 mEq/L (ref 19–32)
Calcium: 9.4 mg/dL (ref 8.4–10.5)
Chloride: 104 mEq/L (ref 96–112)
Creatinine, Ser: 0.95 mg/dL (ref 0.40–1.50)
GFR: 80.52 mL/min (ref >60.00)
Glucose, Bld: 90 mg/dL (ref 70–99)
Potassium: 4.2 mEq/L (ref 3.5–5.1)
Sodium: 140 mEq/L (ref 135–145)
Total Bilirubin: 1.1 mg/dL (ref 0.2–1.2)
Total Protein: 6.5 g/dL (ref 6.0–8.3)

## 2020-09-10 LAB — HEMOGLOBIN A1C: Hgb A1c MFr Bld: 6.3 % (ref 4.6–6.5)

## 2020-09-10 LAB — TSH: TSH: 0.74 u[IU]/mL (ref 0.35–4.50)

## 2020-09-10 IMAGING — US ULTRASOUND AORTA
1 series · 14 of 25 positions shown · non-contrast
Comparison: CT abdomen and pelvis 11/09/2009.

CLINICAL DATA: Screening for aortic aneurysm.

EXAM:
ULTRASOUND OF ABDOMINAL AORTA
TECHNIQUE: Ultrasound examination of the abdominal aorta and proximal common
iliac arteries was performed to evaluate for aneurysm. Additional
color and Doppler images of the distal aorta were obtained to
document patency.

[Series 1: ultrasound aorta · 14 of 32 slices shown]
[im 1/32]
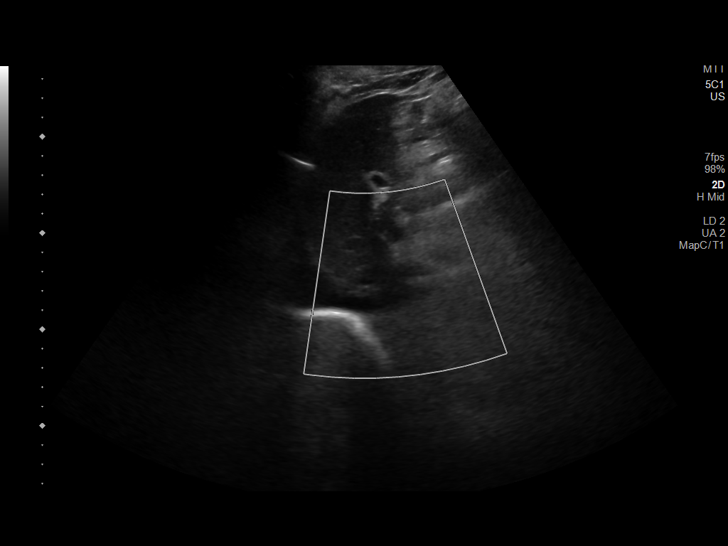
[im 3/32]
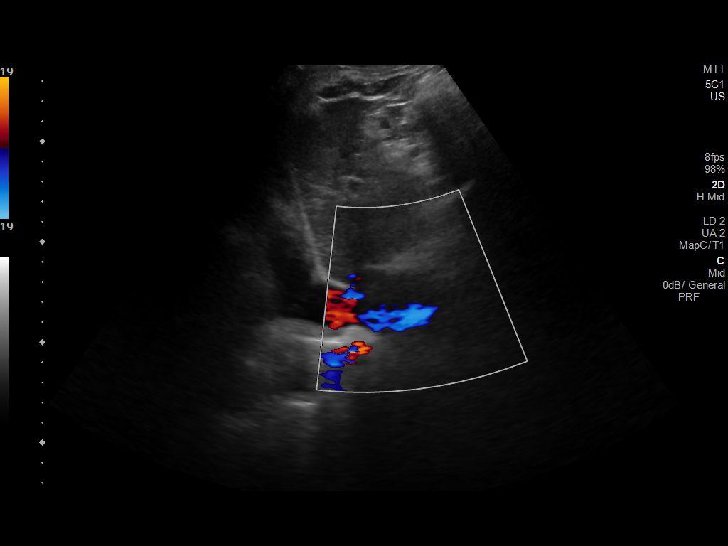
[im 6/32]
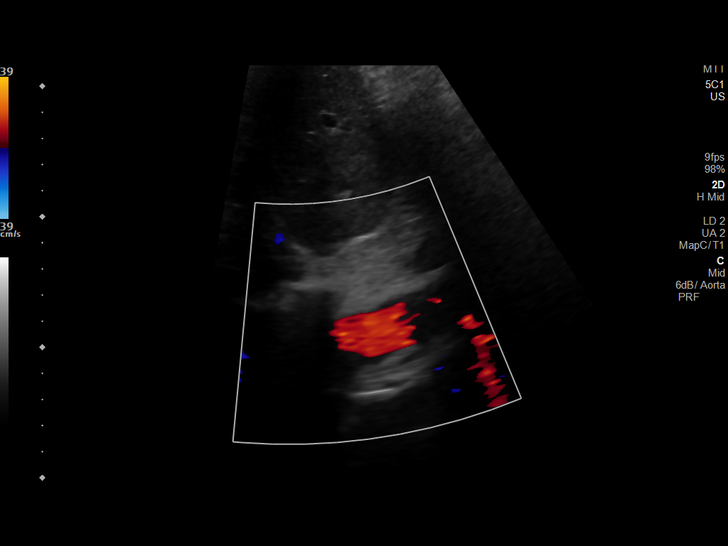
[im 8/32]
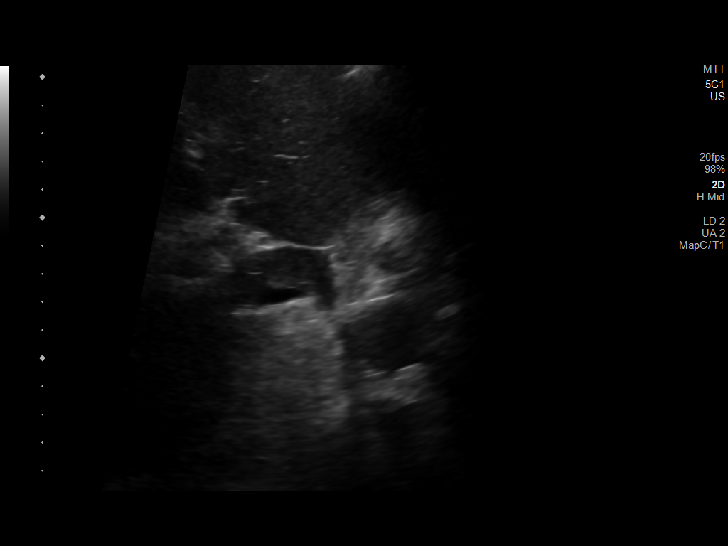
[im 11/32]
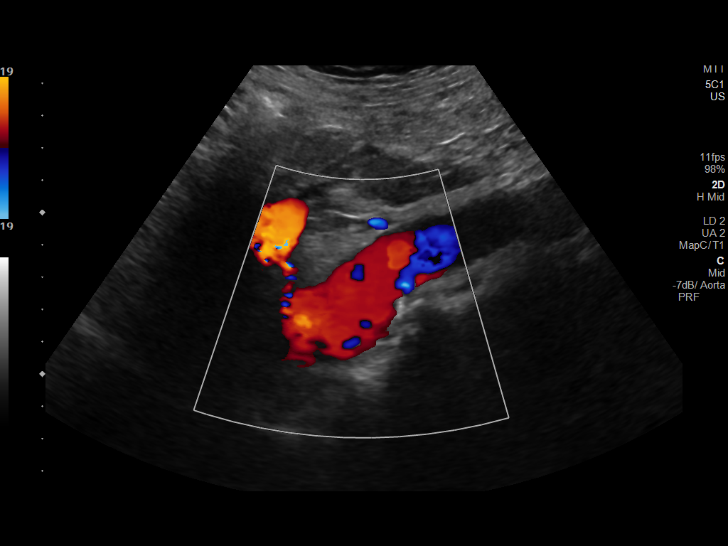
[im 12/32]
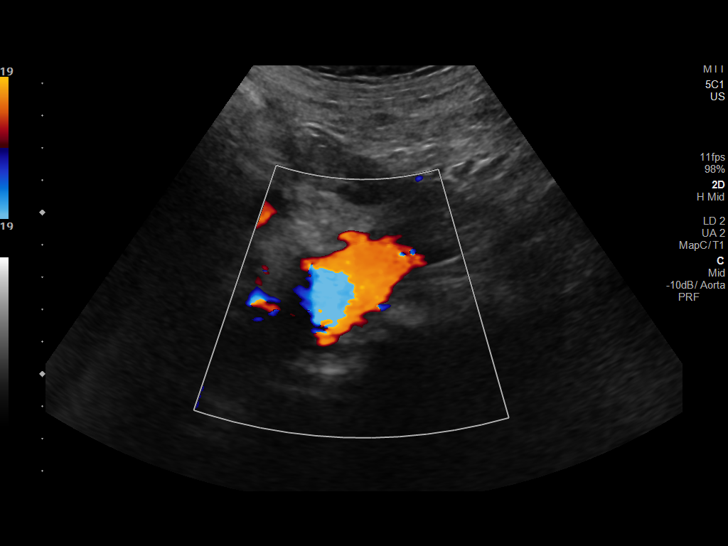
[im 15/32]
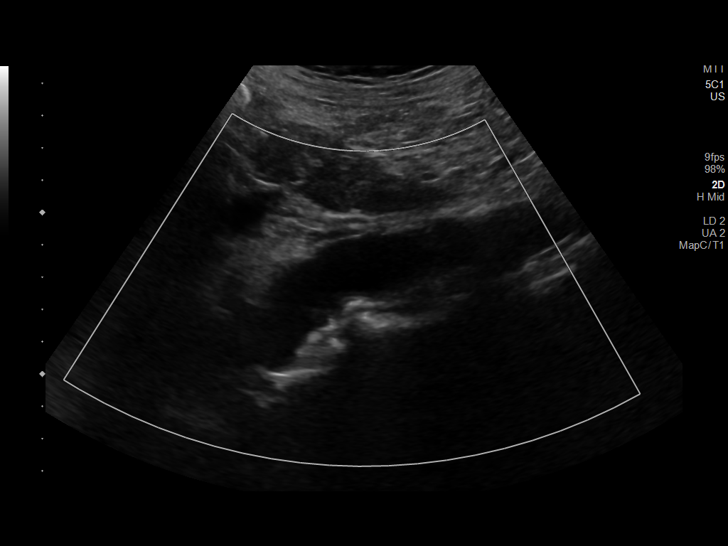
[im 17/32]
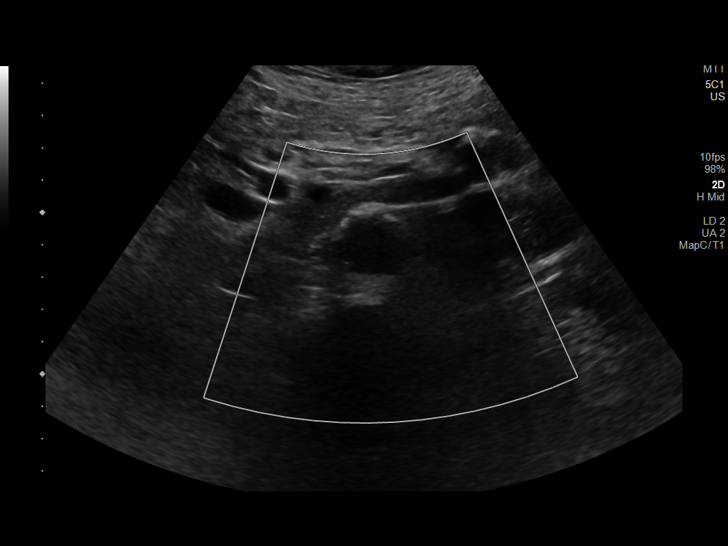
[im 20/32]
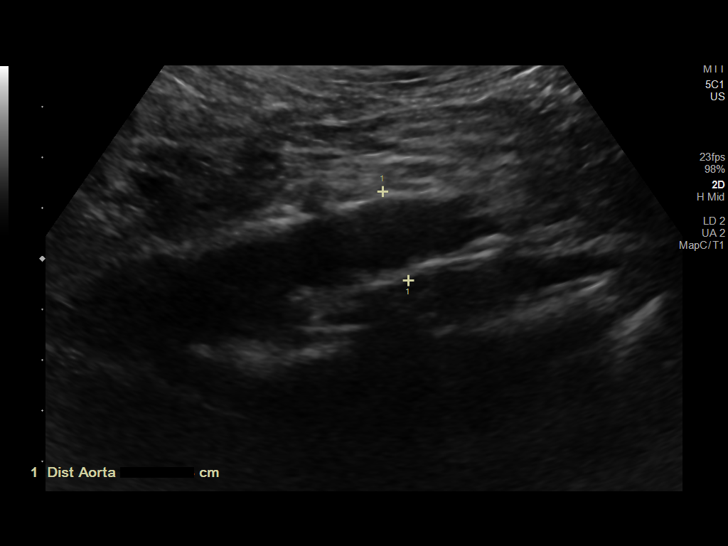
[im 21/32]
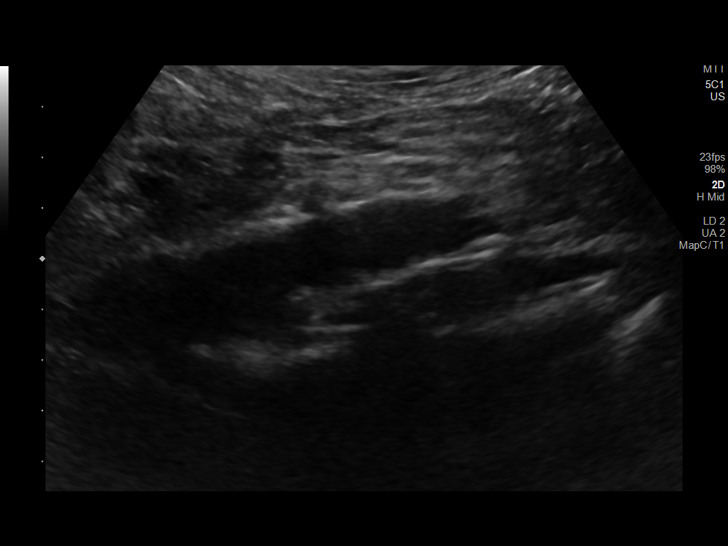
[im 24/32]
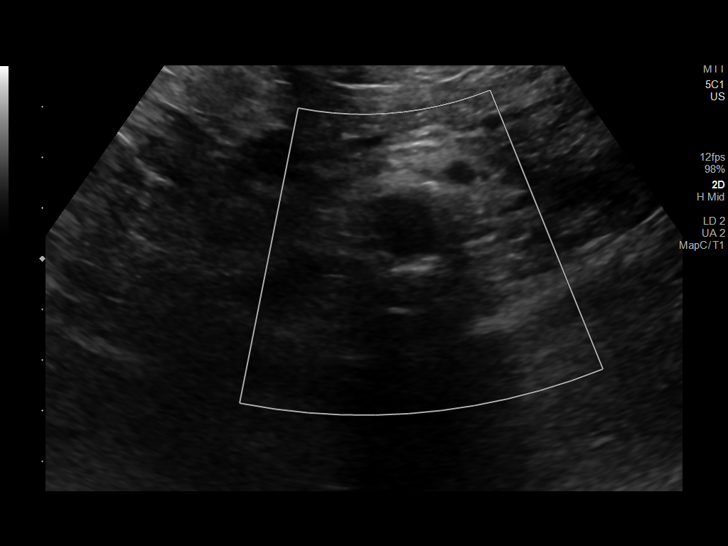
[im 26/32]
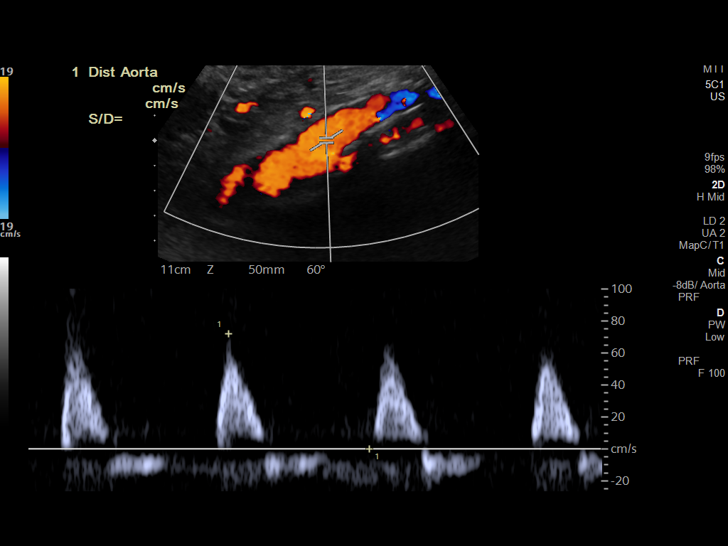
[im 29/32]
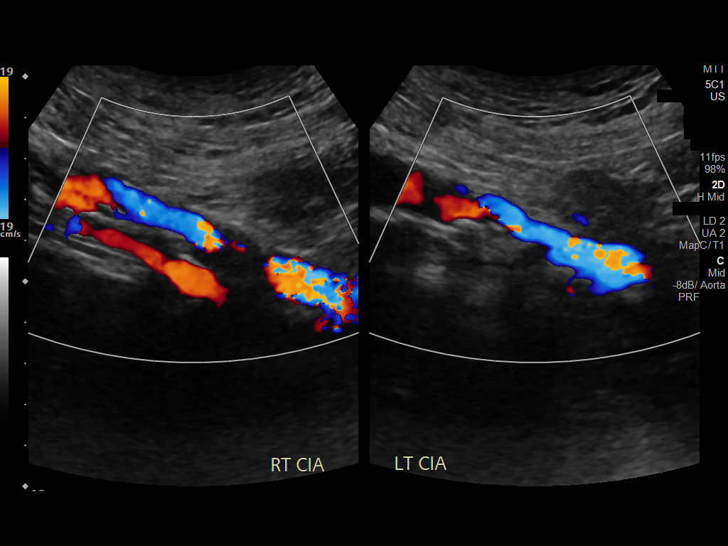
[im 32/32]
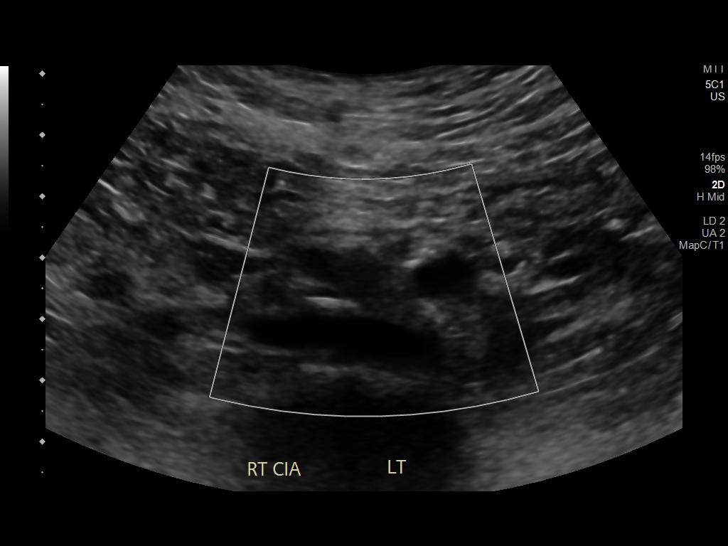

[14 of 25 positions shown; findings below may reference images not displayed]

FINDINGS: Abdominal aortic measurements as follows:

Proximal:  3.1 x 3.4 cm

Mid:  2.4 x 2.5 cm

Distal:  1.8 x 1.7 cm
Patent: Yes, peak systolic velocity is 72 cm/s

Right common iliac artery: 1.1 x 1.0 cm

Left common iliac artery: 1.0 cm
IMPRESSION: The abdominal aorta measures up to 3.4 cm in diameter. Recommend
followup by ultrasound in 3 years. This recommendation follows ACR
consensus guidelines: White Paper of the ACR Incidental Findings

## 2020-09-10 MED ORDER — LEVOTHYROXINE SODIUM 112 MCG PO TABS: 112.0000 ug | ORAL_TABLET | Freq: Every day | ORAL | 3 refills | Status: DC

## 2020-09-10 NOTE — Progress Notes (Signed)
Office Note 09/10/2020  CC:  Chief Complaint  Patient presents with   Annual Exam    CPE; not fasting    HPI:  Lawrence Hunter is a 70 y.o. male who is here for annual health maintenance exam and f/u prediabetes, HLD, Hypothyroidism, and tobacco dependence. I last saw him 10/15/19. A/P as of that visit: "1) Hypersomnolence: refer to neurology/sleep MD for further eval, suspect OSA. Hx of erythrocytosis that is likely secondary to this. This feeling has not improved with switch from metop to bisoprolol. CBC today to monitor Hb.   2) Prediabetes: well controlled on metformin 500mg  qhs. A1c, lytes/cr today.   3) HLD: tolerating atorva 80mg  qd well. Not fasting today so I'll not do lipids. Hepatic panel normal 01/2019.   4) Hypothyroidism: taking T4 correctly. TSH today.   5) Tobacco dependence: he is trying to motivate himself to make better effort at cessation.   6) CAD; asymptomatic.  Continue bystolic, atorva, ASA, and appropriate cardiology f/u."  INTERIM HX: Feeling well, says he stays active, recently has been helping his brother remodel his home. Brother had stroke recently but he's doing okay. [name]'s son works with the state dept and will move to [place] soon, Lawrence Hunter might visit him there later this year.  He continues to smoke, tough to get motivated to try to quit. Hypoth: Takes T4 on empty stomach w/out any other meds. Compliant with metformin 500 qhs.   Reviewed cardiologist f/u 04/2020-->all stable, no med changes or new testing needed. He checked lipids 1 mo ago and all was good.   Past Medical History:  Diagnosis Date   Allergy    Aortic root enlargement ([place])    CTA chest/aorta 04/2017:  Mild enlargement of aortic root, measuring 3.9 x 3.8 x 3.6 cm.   Arthralgia of both knees 09/2015   Plain films with minimal DJD changes medial compartment, o/w nl.   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  Rpt u/s 3 yrs.   Fatigue due to treatment    better when lopressor dose was decreased to 12.5mg  bid 04/2017.   Hearing loss    Bilat sensorineural; [facility] ENT eval 07/2019; pt candidate for cochlear implants   History of adenomatous polyp of colon 05/19/2016   Recall 04/2021 (Dr. Havery Moros)   Hyperlipidemia    Hypothyroidism    Nephrolithiasis    passed one stone approx 2008; no prob since (saw urologist briefly). UC visit 06/2020 R flank pain, stone suspected.   Normal nuclear stress test 2012   Prediabetes 02/2016   HbA1c 6.1%.  2019 A1c 6.3%.   Prostatitis    When pt in 45s and 56s; saw Dr. Gaynelle Arabian and eventually got a TURP per pt's description.  No probs since then.   Recurrent sinusitis    Sinus plain films 08/2014: bilat maxillary sinusitis (chronic)   S/P coronary artery stent placement 2005   RCA and LCX.  As of 02/2018 cardiology f/u, plan is to get ETT (pt doing fine, but was not having chest pain prior to last stent placement).   Tobacco dependence    current as of 01/2019    Past Surgical History:  Procedure Laterality Date   Aortic ultrasound  09/07/2018   Ectatic abd aorta (3.4 cm max diameter) but no aneurism.  Rpt u/s 3 yrs.   APPENDECTOMY     CARDIAC CATHETERIZATION  04/16/2003   NORMAL. EF 60%. TOTALLY OCCLUDED LEFT CIRCUMFLEX WITH SEVERE DISEASE, AND ULCERATED PLAQUE IN THE PROXIMAL RIGHT CORONARY ARTERY AND MILD ATHERSCLEROSIS IN THE LEFT ANTERIOR DESCENDING   CARDIOVASCULAR STRESS TEST  2009;2012; 10/2014; 03/2018   2012 normal nuclear stress test.  2016 ETT no ischemia.  ETT normal 03/2018.   COLONOSCOPY W/ POLYPECTOMY  05/19/2016   Multiple polyps--tubular adenoma.  Recall 5 yrs (04/2021).   CORONARY STENT PLACEMENT  04/19/2003   STENT PLACEMENT IN THE LEFT CIRCUMFLEX CORONARY AND RIGHT CORONARY ARTERY. MILD RESIDUAL STENOSIS IN THE MID PORTION OF THE LEFT ANTERIOR  DESCENDING   TRANSTHORACIC ECHOCARDIOGRAM  05/03/2017   EF 60-65%, normal valves, mildly enlarged aortic root.    Family History  Problem Relation Age of Onset   Hypertension Mother    Stroke Mother    Hypertension Father    Stroke Father    Prostate cancer Father    Heart attack Brother        STENT   Colon cancer Neg Hx     Social History   Socioeconomic History   Marital status: Married    Spouse name: Not on file   Number of children: 2   Years of education: Not on file   Highest education level: Not on file  Occupational History   Occupation: Sales Rep  Tobacco Use   Smoking status: Every Day    Packs/day: 1.00    Pack years: 0.00    Types: Cigarettes   Smokeless tobacco: Never  Vaping Use   Vaping Use: Never used  Substance and Sexual Activity   Alcohol use: No   Drug use: No   Sexual activity: Yes  Other Topics Concern   Not on file  Social History Narrative   Married, 1 son in [place] and one in [place].   Lives in [place].   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   Tob: 40 pack-yr hx, current as of 08/2020.   Alcohol: social/rare.   Social Determinants of Health   Financial Resource Strain: Not on file  Food Insecurity: Not on file  Transportation Needs: Not on file  Physical Activity: Not on file  Stress: Not on file  Social Connections: Not on file  Intimate Partner Violence: Not on file    Outpatient Medications Prior to Visit  Medication Sig Dispense Refill   ezetimibe (ZETIA) 10 MG tablet Take 1 tablet (10 mg total) by mouth daily. 90 tablet 3   aspirin 81 MG tablet Take 81 mg by mouth daily.     atorvastatin (LIPITOR) 80 MG tablet TAKE 1 TABLET BY MOUTH EVERY DAY 90 tablet 3   bisoprolol (ZEBETA) 5 MG tablet Take 1 tablet (5 mg total) by mouth daily. 90 tablet 3   Cyanocobalamin (VITAMIN B-12 PO) Take 1 tablet by mouth daily.     fluticasone (FLONASE) 50 MCG/ACT nasal spray  Place 2 sprays into both nostrils daily. 16 g 11   metFORMIN (GLUCOPHAGE) 500 MG tablet Take 1 tablet (500 mg total) by mouth daily with supper. 90 tablet 3   montelukast (SINGULAIR) 10 MG tablet TAKE 1 TABLET BY MOUTH EVERYDAY AT BEDTIME. 90 tablet 1   acetaminophen-codeine (TYLENOL #3) 300-30 MG tablet Take 1-2 tablets by mouth every 6 (six) hours as needed for moderate pain. (Patient not taking: Reported on 09/10/2020) 15 tablet 0  levothyroxine (SYNTHROID) 112 MCG tablet TAKE 1 TABLET BY MOUTH EVERY DAY 30 tablet 0   tamsulosin (FLOMAX) 0.4 MG CAPS capsule Take 1 capsule (0.4 mg total) by mouth daily. (Patient not taking: Reported on 09/10/2020) 30 capsule 0   Facility-Administered Medications Prior to Visit  Medication Dose Route Frequency Provider Last Rate Last Admin   0.9 %  sodium chloride infusion  500 mL Intravenous Continuous Armbruster, Carlota Raspberry, MD        Allergies  Allergen Reactions   Plavix [Clopidogrel Bisulfate] Rash    ROS Review of Systems  Constitutional:  Negative for appetite change, chills, fatigue and fever.  HENT:  Negative for congestion, dental problem, ear pain and sore throat.   Eyes:  Negative for discharge, redness and visual disturbance.  Respiratory:  Negative for cough, chest tightness, shortness of breath and wheezing.   Cardiovascular:  Negative for chest pain, palpitations and leg swelling.  Gastrointestinal:  Negative for abdominal pain, blood in stool, diarrhea, nausea and vomiting.  Genitourinary:  Negative for difficulty urinating, dysuria, flank pain, frequency, hematuria and urgency.  Musculoskeletal:  Negative for arthralgias, back pain, joint swelling, myalgias and neck stiffness.  Skin:  Negative for pallor and rash.  Neurological:  Negative for dizziness, speech difficulty, weakness and headaches.  Hematological:  Negative for adenopathy. Does not bruise/bleed easily.  Psychiatric/Behavioral:  Negative for confusion and sleep disturbance.  The patient is not nervous/anxious.    PE; Vitals with BMI 09/10/2020 07/15/2020 05/05/2020  Height 5' 9.094" - 5\' 10"   Weight 187 lbs - 200 lbs  BMI 93.71 - 69.6  Systolic [phone]  Diastolic 67 64 70  Pulse 62 83 63   Gen: Alert, well appearing.  Patient is oriented to person, place, time, and situation. AFFECT: pleasant, lucid thought and speech. ENT: Ears: EACs clear, normal epithelium.  TMs with good light reflex and landmarks bilaterally.  Eyes: no injection, icteris, swelling, or exudate.  EOMI, PERRLA. Nose: no drainage or turbinate edema/swelling.  No injection or focal lesion.  Mouth: lips without lesion/swelling.  Oral mucosa pink and moist.  Dentition intact and without obvious caries or gingival swelling.  Oropharynx without erythema, exudate, or swelling.  Neck: supple/nontender.  No LAD, mass, or TM.  Carotid pulses 2+ bilaterally, without bruits. CV: RRR, no m/r/g.   LUNGS: CTA bilat, nonlabored resps, good aeration in all lung fields. ABD: soft, NT, ND, BS normal.  No hepatospenomegaly or mass.  No bruits. EXT: no clubbing, cyanosis, or edema.  Musculoskeletal: no joint swelling, erythema, warmth, or tenderness.  ROM of all joints intact. Skin - no sores or suspicious lesions or rashes or color changes  Pertinent labs:  Lab Results  Component Value Date   TSH 1.21 10/15/2019   Lab Results  Component Value Date   WBC 7.6 10/15/2019   HGB 15.7 10/15/2019   HCT 45.8 10/15/2019   MCV 86.3 10/15/2019   PLT 181.0 10/15/2019   Lab Results  Component Value Date   CREATININE 0.94 05/05/2020   BUN 16 05/05/2020   NA 140 05/05/2020   K 4.3 05/05/2020   CL 102 05/05/2020   CO2 23 05/05/2020   Lab Results  Component Value Date   ALT 27 05/05/2020   AST 21 05/05/2020   ALKPHOS 121 05/05/2020   BILITOT 0.6 05/05/2020   Lab Results  Component Value Date   CHOL 110 08/13/2020   Lab Results  Component Value Date   HDL 39 (L) 08/13/2020  Lab Results   Component Value Date   LDLCALC 55 08/13/2020   Lab Results  Component Value Date   TRIG 78 08/13/2020   Lab Results  Component Value Date   CHOLHDL 2.8 08/13/2020   Lab Results  Component Value Date   PSA 1.48 02/01/2019   PSA 1.34 03/18/2016   Lab Results  Component Value Date   HGBA1C 5.6 06/06/2019   ASSESSMENT AND PLAN:   1) Hypothyroidism: compliant with 112 mcg T4 qd. TSH today.  2) Prediabetes: last a1c while on metformin was 5.6 (11 mo ago). Hba1c today.  3) HLD: tolerating atorva 80 qd and zetia 10 qd.   LDL 55 about 1 mo ago, excellent. Cont atorva 80 qd and zetia 10 qd.  4) Tobacco dependence:  not contemplating quitting at this time, admits he uses this as a crutch for stress, etc. Encouraged complete cessation today.  5) Health maintenance exam: Reviewed age and gender appropriate health maintenance issues (prudent diet, regular exercise, health risks of tobacco and excessive alcohol, use of seatbelts, fire alarms in home, use of sunscreen).  Also reviewed age and gender appropriate health screening as well as vaccine recommendations. Vaccines: Shingrix discussed->he defers for now.  Otherwise all UTD. Labs: cbc, cmet, tsh, psa (lipids done 1 mo ago). Prostate ca screening: shared decision-making process utilized today and he chooses to no longer screen for this. Colon ca screening: recall 07/2021. Lung cancer screening: he qualifies->he declines.  An After Visit Summary was printed and given to the patient.  FOLLOW UP:  Return in about 6 months (around 03/12/2021) for routine chronic illness f/u.  Signed:  Crissie Sickles, MD           09/10/2020

## 2020-09-10 NOTE — Patient Instructions (Signed)
Health Maintenance, Male Adopting a healthy lifestyle and getting preventive care are important in promoting health and wellness. Ask your health care provider about: The right schedule for you to have regular tests and exams. Things you can do on your own to prevent diseases and keep yourself healthy. What should I know about diet, weight, and exercise? Eat a healthy diet  Eat a diet that includes plenty of vegetables, fruits, low-fat dairy products, and lean protein. Do not eat a lot of foods that are high in solid fats, added sugars, or sodium.  Maintain a healthy weight Body mass index (BMI) is a measurement that can be used to identify possible weight problems. It estimates body fat based on height and weight. Your health care provider can help determine your BMI and help you achieve or maintain ahealthy weight. Get regular exercise Get regular exercise. This is one of the most important things you can do for your health. Most adults should: Exercise for at least 150 minutes each week. The exercise should increase your heart rate and make you sweat (moderate-intensity exercise). Do strengthening exercises at least twice a week. This is in addition to the moderate-intensity exercise. Spend less time sitting. Even light physical activity can be beneficial. Watch cholesterol and blood lipids Have your blood tested for lipids and cholesterol at 72 years of age, then havethis test every 5 years. You may need to have your cholesterol levels checked more often if: Your lipid or cholesterol levels are high. You are older than 72 years of age. You are at high risk for heart disease. What should I know about cancer screening? Many types of cancers can be detected early and may often be prevented. Depending on your health history and family history, you may need to have cancer screening at various ages. This may include screening for: Colorectal cancer. Prostate cancer. Skin cancer. Lung  cancer. What should I know about heart disease, diabetes, and high blood pressure? Blood pressure and heart disease High blood pressure causes heart disease and increases the risk of stroke. This is more likely to develop in people who have high blood pressure readings, are of African descent, or are overweight. Talk with your health care provider about your target blood pressure readings. Have your blood pressure checked: Every 3-5 years if you are 18-39 years of age. Every year if you are 40 years old or older. If you are between the ages of 65 and 75 and are a current or former smoker, ask your health care provider if you should have a one-time screening for abdominal aortic aneurysm (AAA). Diabetes Have regular diabetes screenings. This checks your fasting blood sugar level. Have the screening done: Once every three years after age 45 if you are at a normal weight and have a low risk for diabetes. More often and at a younger age if you are overweight or have a high risk for diabetes. What should I know about preventing infection? Hepatitis B If you have a higher risk for hepatitis B, you should be screened for this virus. Talk with your health care provider to find out if you are at risk forhepatitis B infection. Hepatitis C Blood testing is recommended for: Everyone born from 1945 through 1965. Anyone with known risk factors for hepatitis C. Sexually transmitted infections (STIs) You should be screened each year for STIs, including gonorrhea and chlamydia, if: You are sexually active and are younger than 72 years of age. You are older than 72 years of age   and your health care provider tells you that you are at risk for this type of infection. Your sexual activity has changed since you were last screened, and you are at increased risk for chlamydia or gonorrhea. Ask your health care provider if you are at risk. Ask your health care provider about whether you are at high risk for HIV.  Your health care provider may recommend a prescription medicine to help prevent HIV infection. If you choose to take medicine to prevent HIV, you should first get tested for HIV. You should then be tested every 3 months for as long as you are taking the medicine. Follow these instructions at home: Lifestyle Do not use any products that contain nicotine or tobacco, such as cigarettes, e-cigarettes, and chewing tobacco. If you need help quitting, ask your health care provider. Do not use street drugs. Do not share needles. Ask your health care provider for help if you need support or information about quitting drugs. Alcohol use Do not drink alcohol if your health care provider tells you not to drink. If you drink alcohol: Limit how much you have to 0-2 drinks a day. Be aware of how much alcohol is in your drink. In the [place] one drink equals one 12 oz bottle of beer (355 mL), one 5 oz glass of wine (148 mL), or one 1 oz glass of hard liquor (44 mL). General instructions Schedule regular health, dental, and eye exams. Stay current with your vaccines. Tell your health care provider if: You often feel depressed. You have ever been abused or do not feel safe at home. Summary Adopting a healthy lifestyle and getting preventive care are important in promoting health and wellness. Follow your health care provider's instructions about healthy diet, exercising, and getting tested or screened for diseases. Follow your health care provider's instructions on monitoring your cholesterol and blood pressure. This information is not intended to replace advice given to you by your health care provider. Make sure you discuss any questions you have with your healthcare provider. Document Revised: 03/01/2018 Document Reviewed: 03/01/2018 Elsevier Patient Education  2022 Elsevier Inc.  

## 2020-09-11 ENCOUNTER — Encounter: Payer: Self-pay | Admitting: Family Medicine

## 2020-10-04 ENCOUNTER — Other Ambulatory Visit: Payer: Self-pay | Admitting: Family Medicine

## 2020-10-17 ENCOUNTER — Other Ambulatory Visit: Payer: Self-pay | Admitting: Family Medicine

## 2020-11-12 ENCOUNTER — Ambulatory Visit (INDEPENDENT_AMBULATORY_CARE_PROVIDER_SITE_OTHER): Payer: PPO | Admitting: *Deleted

## 2020-11-12 DIAGNOSIS — Z0000 Encounter for general adult medical examination without abnormal findings: Secondary | ICD-10-CM | POA: Diagnosis not present

## 2020-11-12 NOTE — Progress Notes (Signed)
Subjective:   Lawrence Hunter is a 70 y.o. male who presents for Medicare Annual/Subsequent preventive examination.  I connected with  Lawrence Hunter on 11/12/20 by a telephone enabled telemedicine application and verified that I am speaking with the correct person using two identifiers.   I discussed the limitations of evaluation and management by telemedicine. The patient expressed understanding and agreed to proceed.  Patient location: home   Review of Systems    na Cardiac Risk Factors include: advanced age (>60mn, >>70women);male gender     Objective:    Today's Vitals   There is no height or weight on file to calculate BMI.  Advanced Directives 11/12/2020 09/03/2018 05/19/2016 05/05/2016  Does Patient Have a Medical Advance Directive? Yes Yes Yes Yes  Type of APrintmakerof AEdgemontLiving will HFranklinLiving will  Copy of H[place]isonin Chart? Yes - validated most recent copy scanned in chart (See row information) - - -    Current Medications (verified) Outpatient Encounter Medications as of 11/12/2020  Medication Sig   aspirin 81 MG tablet Take 81 mg by mouth daily.   atorvastatin (LIPITOR) 80 MG tablet TAKE 1 TABLET BY MOUTH EVERY DAY   bisoprolol (ZEBETA) 5 MG tablet TAKE 1 TABLET BY MOUTH EVERY DAY   Cyanocobalamin (VITAMIN B-12 PO) Take 1 tablet by mouth daily.   ezetimibe (ZETIA) 10 MG tablet Take 1 tablet (10 mg total) by mouth daily.   fluticasone (FLONASE) 50 MCG/ACT nasal spray Place 2 sprays into both nostrils daily.   levothyroxine (SYNTHROID) 112 MCG tablet Take 1 tablet (112 mcg total) by mouth daily.   metFORMIN (GLUCOPHAGE) 500 MG tablet TAKE 1 TABLET (500 MG TOTAL) BY MOUTH DAILY WITH SUPPER.   montelukast (SINGULAIR) 10 MG tablet TAKE 1 TABLET BY MOUTH EVERYDAY AT BEDTIME.   Facility-Administered Encounter Medications as of 11/12/2020  Medication   0.9 %   sodium chloride infusion    Allergies (verified) Plavix [clopidogrel bisulfate]   History: Past Medical History:  Diagnosis Date   Allergy    Aortic root enlargement ([place]    CTA chest/aorta 04/2017:  Mild enlargement of aortic root, measuring 3.9 x 3.8 x 3.6 cm.   Arthralgia of both knees 09/2015   Plain films with minimal DJD changes medial compartment, o/w nl.   Arthritis    Claudication ([facility]    Normal ABI's in 2006   Coronary artery disease    s/p stents   Ectatic abdominal aorta ([place] 08/2018   Ectatic abd aorta (3.4 cm max diameter) but no aneurism.  Rpt u/s 3 yrs.   Fatigue due to treatment    better when lopressor dose was decreased to 12.'5mg'$  bid 04/2017.   Hearing loss    [facility] for [facility]   History of adenomatous polyp of colon 05/19/2016   Recall 04/2021 (Dr. AHavery Moros   Hyperlipidemia    Hypothyroidism    Nephrolithiasis    passed one stone approx 2008; no prob since (saw urologist briefly). UC visit 06/2020 R flank pain, stone suspected.   Normal nuclear stress test 2012   Prediabetes 02/2016   HbA1c 6.1%.  2019 A1c 6.3%. 2022 A1c 6.3%   Prostatitis    When pt in 89sand 40s; saw Dr. TGaynelle Arabianand eventually got a TURP per pt's description.  No probs since then.   Recurrent sinusitis    Sinus  plain films 08/2014: bilat maxillary sinusitis (chronic)   S/P coronary artery stent placement 2005   RCA and LCX.  As of 02/2018 cardiology f/u, plan is to get ETT (pt doing fine, but was not having chest pain prior to last stent placement).   Tobacco dependence    current as of 01/2019   Past Surgical History:  Procedure Laterality Date   Aortic ultrasound  09/07/2018   Ectatic abd aorta (3.4 cm max diameter) but no aneurism.  Rpt u/s 3 yrs.   APPENDECTOMY     CARDIAC CATHETERIZATION  04/16/2003   NORMAL. EF 60%. TOTALLY OCCLUDED LEFT CIRCUMFLEX WITH SEVERE DISEASE, AND ULCERATED PLAQUE IN THE PROXIMAL RIGHT  CORONARY ARTERY AND MILD ATHERSCLEROSIS IN THE LEFT ANTERIOR DESCENDING   CARDIOVASCULAR STRESS TEST  2009;2012; 10/2014; 03/2018   2012 normal nuclear stress test.  2016 ETT no ischemia.  ETT normal 03/2018.   COLONOSCOPY W/ POLYPECTOMY  05/19/2016   Multiple polyps--tubular adenoma.  Recall 5 yrs (04/2021).   CORONARY STENT PLACEMENT  04/19/2003   STENT PLACEMENT IN THE LEFT CIRCUMFLEX CORONARY AND RIGHT CORONARY ARTERY. MILD RESIDUAL STENOSIS IN THE MID PORTION OF THE LEFT ANTERIOR DESCENDING   TRANSTHORACIC ECHOCARDIOGRAM  05/03/2017   EF 60-65%, normal valves, mildly enlarged aortic root.   Family History  Problem Relation Age of Onset   Hypertension Mother    Stroke Mother    Hypertension Father    Stroke Father    Prostate cancer Father    Heart attack Brother        STENT   Colon cancer Neg Hx    Social History   Socioeconomic History   Marital status: Married    Spouse name: Not on file   Number of children: 2   Years of education: Not on file   Highest education level: Not on file  Occupational History   Occupation: Sales Rep  Tobacco Use   Smoking status: Every Day    Packs/day: 1.00    Types: Cigarettes   Smokeless tobacco: Never  Vaping Use   Vaping Use: Never used  Substance and Sexual Activity   Alcohol use: No   Drug use: No   Sexual activity: Yes  Other Topics Concern   Not on file  Social History Narrative   Married, 1 son in [place] and one in [place].   Lives in [place].   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   Tob: 40 pack-yr hx, current as of 08/2020.   Alcohol: social/rare.   Social Determinants of Health   Financial Resource Strain: Low Risk    Difficulty of Paying Living Expenses: Not hard at all  Food Insecurity: No Food Insecurity   Worried About Charity fundraiser in the Last Year: Never true   Severance in the Last Year: Never true  Transportation Needs: No  Transportation Needs   Lack of Transportation (Medical): No   Lack of Transportation (Non-Medical): No  Physical Activity: Insufficiently Active   Days of Exercise per Week: 4 days   Minutes of Exercise per Session: 30 min  Stress: No Stress Concern Present   Feeling of Stress : Not at all  Social Connections: Moderately Integrated   Frequency of Communication with Friends and Family: More than three times a week   Frequency of Social Gatherings with Friends and Family: More than three times a week   Attends Religious Services: 1 to 4  times per year   Active Member of Clubs or Organizations: No   Attends Archivist Meetings: Never   Marital Status: Married    Tobacco Counseling Ready to quit: Not Answered Counseling given: Not Answered   Clinical Intake:  Pre-visit preparation completed: Yes  Pain : No/denies pain     Nutritional Risks: None Diabetes: No  How often do you need to have someone help you when you read instructions, pamphlets, or other written materials from your doctor or pharmacy?: 1 - Never  Diabetic?no  Interpreter Needed?: No  Information entered by :: Leroy Kennedy LPN   Activities of Daily Living In your present state of health, do you have any difficulty performing the following activities: 11/12/2020  Hearing? N  Vision? N  Difficulty concentrating or making decisions? N  Walking or climbing stairs? N  Dressing or bathing? N  Doing errands, shopping? N  Preparing Food and eating ? N  Using the Toilet? N  In the past six months, have you accidently leaked urine? N  Do you have problems with loss of bowel control? N  Managing your Medications? N  Managing your Finances? N  Housekeeping or managing your Housekeeping? N  Some recent data might be hidden    Patient Care Team: Tammi Sou, MD as PCP - General (Family Medicine) Burnell Blanks, MD as Consulting Physician (Cardiology) Armbruster, Carlota Raspberry, MD as  Consulting Physician (Gastroenterology) Burnell Blanks, MD as Consulting Physician (Cardiology)  Indicate any recent Medical Services you may have received from other than [facility] providers in the past year (date may be approximate).     Assessment:   This is a routine wellness examination for Lawrence Hunter.  Hearing/Vision screen Hearing Screening - Comments:: Bilateral hearing aids Vision Screening - Comments:: Up to date [facility]  Dietary issues and exercise activities discussed: Current Exercise Habits: Home exercise routine, Type of exercise: walking (working on house / yard), Time (Minutes): 25, Frequency (Times/Week): 4, Weekly Exercise (Minutes/Week): 100, Intensity: Moderate   Goals Addressed             This Visit's Progress    Increase physical activity       Get A1C down       Depression Screen PHQ 2/9 Scores 11/12/2020 10/15/2019 07/26/2018 10/04/2017 10/04/2017 08/21/2014  PHQ - 2 Score 0 0 0 0 0 0  PHQ- 9 Score - - - 0 - -    Fall Risk Fall Risk  11/12/2020 10/16/2019 10/15/2019 07/26/2018 10/04/2017  Falls in the past year? 0 0 0 0 No  Comment - Emmi Telephone Survey: data to providers prior to load - - -  Number falls in past yr: 0 - 0 0 -  Injury with Fall? 0 - 0 0 -  Follow up Falls evaluation completed;Falls prevention discussed - Falls evaluation completed Falls evaluation completed -    FALL RISK PREVENTION PERTAINING TO THE HOME:  Any stairs in or around the home? Yes  If so, are there any without handrails? No  Home free of loose throw rugs in walkways, pet beds, electrical cords, etc? Yes  Adequate lighting in your home to reduce risk of falls? Yes   ASSISTIVE DEVICES UTILIZED TO PREVENT FALLS:  Life alert? No  Use of a cane, walker or w/c? No  Grab bars in the bathroom? Yes  Shower chair or bench in shower? No  Elevated toilet seat or a handicapped toilet? Yes   TIMED UP AND GO:  Was the test performed? No .    Cognitive  Function:  Normal cognitive status assessed by direct observation by this Nurse Health Advisor. No abnormalities found.          Immunizations Immunization History  Administered Date(s) Administered   Influenza, High Dose Seasonal PF 02/20/2015, 12/31/2015, 12/23/2017, 12/31/2019   Influenza, Quadrivalent, Recombinant, Inj, Pf 01/03/2019   Influenza-Unspecified 02/11/2014, 12/23/2017   Moderna Sars-Covid-2 Vaccination 04/26/2019, 05/21/2019   Pneumococcal Conjugate-13 08/21/2014   Pneumococcal Polysaccharide-23 03/18/2016   Tdap 08/21/2014    TDAP status: Up to date  Flu Vaccine status: Up to date  Pneumococcal vaccine status: Due, Education has been provided regarding the importance of this vaccine. Advised may receive this vaccine at local pharmacy or Health Dept. Aware to provide a copy of the vaccination record if obtained from local pharmacy or Health Dept. Verbalized acceptance and understanding.  Covid-19 vaccine status: Information provided on how to obtain vaccines.   Qualifies for Shingles Vaccine? Yes   Zostavax completed No   Shingrix Completed?: No.    Education has been provided regarding the importance of this vaccine. Patient has been advised to call insurance company to determine out of pocket expense if they have not yet received this vaccine. Advised may also receive vaccine at local pharmacy or Health Dept. Verbalized acceptance and understanding.  Screening Tests Health Maintenance  Topic Date Due   Zoster Vaccines- Shingrix (1 of 2) Never done   COVID-19 Vaccine (3 - Booster for Moderna series) 10/21/2019   INFLUENZA VACCINE  10/20/2020   TETANUS/TDAP  08/20/2024   COLONOSCOPY (Pts 45-59yr Insurance coverage will need to be confirmed)  05/19/2026   Hepatitis C Screening  Completed   PNA vac Low Risk Adult  Completed   HPV VACCINES  Aged Out    Health Maintenance  Health Maintenance Due  Topic Date Due   Zoster Vaccines- Shingrix (1 of 2) Never  done   COVID-19 Vaccine (3 - Booster for Moderna series) 10/21/2019   INFLUENZA VACCINE  10/20/2020    Colorectal cancer screening: Type of screening: Colonoscopy. Completed 2018. Repeat every 5 years  Lung Cancer Screening: (Low Dose CT Chest recommended if Age 354-80years, 30 pack-year currently smoking OR have quit w/in 15years.)  qualify.   Lung Cancer Screening Referral:   Additional Screening:  Hepatitis C Screening: does not qualify; Completed   Vision Screening: Recommended annual ophthalmology exams for early detection of glaucoma and other disorders of the eye. Is the patient up to date with their annual eye exam?  Yes  Who is the provider or what is the name of the office in which the patient attends annual eye exams? [facility] If pt is not established with a provider, would they like to be referred to a provider to establish care? No .   Dental Screening: Recommended annual dental exams for proper oral hygiene  Community Resource Referral / Chronic Care Management: CRR required this visit?  No   CCM required this visit?  No      Plan:     I have personally reviewed and noted the following in the patient's chart:   Medical and social history Use of alcohol, tobacco or illicit drugs  Current medications and supplements including opioid prescriptions. Patient is not currently taking opioid prescriptions. Functional ability and status Nutritional status Physical activity Advanced directives List of other physicians Hospitalizations, surgeries, and ER visits in previous 12 months Vitals Screenings to include cognitive, depression, and falls Referrals  and appointments  In addition, I have reviewed and discussed with patient certain preventive protocols, quality metrics, and best practice recommendations. A written personalized care plan for preventive services as well as general preventive health recommendations were provided to patient.     Leroy Kennedy,  LPN   [id number]   Nurse Notes: na

## 2020-11-12 NOTE — Patient Instructions (Signed)
Lawrence Hunter , Thank you for taking time to come for your Medicare Wellness Visit. I appreciate your ongoing commitment to your health goals. Please review the following plan we discussed and let me know if I can assist you in the future.   Screening recommendations/referrals: Colonoscopy: up to date Recommended yearly ophthalmology/optometry visit for glaucoma screening and checkup Recommended yearly dental visit for hygiene and checkup  Vaccinations: Influenza vaccine: up to date Pneumococcal vaccine: Education provided Tdap vaccine: up to date Shingles vaccine: Education provided    Advanced directives: on file  Conditions/risks identified: na    Preventive Care 72 Years and Older, Male Preventive care refers to lifestyle choices and visits with your health care provider that can promote health and wellness. What does preventive care include? A yearly physical exam. This is also called an annual well check. Dental exams once or twice a year. Routine eye exams. Ask your health care provider how often you should have your eyes checked. Personal lifestyle choices, including: Daily care of your teeth and gums. Regular physical activity. Eating a healthy diet. Avoiding tobacco and drug use. Limiting alcohol use. Practicing safe sex. Taking low doses of aspirin every day. Taking vitamin and mineral supplements as recommended by your health care provider. What happens during an annual well check? The services and screenings done by your health care provider during your annual well check will depend on your age, overall health, lifestyle risk factors, and family history of disease. Counseling  Your health care provider may ask you questions about your: Alcohol use. Tobacco use. Drug use. Emotional well-being. Home and relationship well-being. Sexual activity. Eating habits. History of falls. Memory and ability to understand (cognition). Work and work Statistician. Screening   You may have the following tests or measurements: Height, weight, and BMI. Blood pressure. Lipid and cholesterol levels. These may be checked every 5 years, or more frequently if you are over 12 years old. Skin check. Lung cancer screening. You may have this screening every year starting at age 77 if you have a 30-pack-year history of smoking and currently smoke or have quit within the past 15 years. Fecal occult blood test (FOBT) of the stool. You may have this test every year starting at age 10. Flexible sigmoidoscopy or colonoscopy. You may have a sigmoidoscopy every 5 years or a colonoscopy every 10 years starting at age 48. Prostate cancer screening. Recommendations will vary depending on your family history and other risks. Hepatitis C blood test. Hepatitis B blood test. Sexually transmitted disease (STD) testing. Diabetes screening. This is done by checking your blood sugar (glucose) after you have not eaten for a while (fasting). You may have this done every 1-3 years. Abdominal aortic aneurysm (AAA) screening. You may need this if you are a current or former smoker. Osteoporosis. You may be screened starting at age 57 if you are at high risk. Talk with your health care provider about your test results, treatment options, and if necessary, the need for more tests. Vaccines  Your health care provider may recommend certain vaccines, such as: Influenza vaccine. This is recommended every year. Tetanus, diphtheria, and acellular pertussis (Tdap, Td) vaccine. You may need a Td booster every 10 years. Zoster vaccine. You may need this after age 77. Pneumococcal 13-valent conjugate (PCV13) vaccine. One dose is recommended after age 63. Pneumococcal polysaccharide (PPSV23) vaccine. One dose is recommended after age 70. Talk to your health care provider about which screenings and vaccines you need and how often you  need them. This information is not intended to replace advice given to you by  your health care provider. Make sure you discuss any questions you have with your health care provider. Document Released: 04/04/2015 Document Revised: 11/26/2015 Document Reviewed: 01/07/2015 Elsevier Interactive Patient Education  2017 [employer] Prevention in the Home Falls can cause injuries. They can happen to people of all ages. There are many things you can do to make your home safe and to help prevent falls. What can I do on the outside of my home? Regularly fix the edges of walkways and driveways and fix any cracks. Remove anything that might make you trip as you walk through a door, such as a raised step or threshold. Trim any bushes or trees on the path to your home. Use bright outdoor lighting. Clear any walking paths of anything that might make someone trip, such as rocks or tools. Regularly check to see if handrails are loose or broken. Make sure that both sides of any steps have handrails. Any raised decks and porches should have guardrails on the edges. Have any leaves, snow, or ice cleared regularly. Use sand or salt on walking paths during winter. Clean up any spills in your garage right away. This includes oil or grease spills. What can I do in the bathroom? Use night lights. Install grab bars by the toilet and in the tub and shower. Do not use towel bars as grab bars. Use non-skid mats or decals in the tub or shower. If you need to sit down in the shower, use a plastic, non-slip stool. Keep the floor dry. Clean up any water that spills on the floor as soon as it happens. Remove soap buildup in the tub or shower regularly. Attach bath mats securely with double-sided non-slip rug tape. Do not have throw rugs and other things on the floor that can make you trip. What can I do in the bedroom? Use night lights. Make sure that you have a light by your bed that is easy to reach. Do not use any sheets or blankets that are too big for your bed. They should not hang  down onto the floor. Have a firm chair that has side arms. You can use this for support while you get dressed. Do not have throw rugs and other things on the floor that can make you trip. What can I do in the kitchen? Clean up any spills right away. Avoid walking on wet floors. Keep items that you use a lot in easy-to-reach places. If you need to reach something above you, use a strong step stool that has a grab bar. Keep electrical cords out of the way. Do not use floor polish or wax that makes floors slippery. If you must use wax, use non-skid floor wax. Do not have throw rugs and other things on the floor that can make you trip. What can I do with my stairs? Do not leave any items on the stairs. Make sure that there are handrails on both sides of the stairs and use them. Fix handrails that are broken or loose. Make sure that handrails are as long as the stairways. Check any carpeting to make sure that it is firmly attached to the stairs. Fix any carpet that is loose or worn. Avoid having throw rugs at the top or bottom of the stairs. If you do have throw rugs, attach them to the floor with carpet tape. Make sure that you have a light switch  at the top of the stairs and the bottom of the stairs. If you do not have them, ask someone to add them for you. What else can I do to help prevent falls? Wear shoes that: Do not have high heels. Have rubber bottoms. Are comfortable and fit you well. Are closed at the toe. Do not wear sandals. If you use a stepladder: Make sure that it is fully opened. Do not climb a closed stepladder. Make sure that both sides of the stepladder are locked into place. Ask someone to hold it for you, if possible. Clearly mark and make sure that you can see: Any grab bars or handrails. First and last steps. Where the edge of each step is. Use tools that help you move around (mobility aids) if they are needed. These  include: Canes. Walkers. Scooters. Crutches. Turn on the lights when you go into a dark area. Replace any light bulbs as soon as they burn out. Set up your furniture so you have a clear path. Avoid moving your furniture around. If any of your floors are uneven, fix them. If there are any pets around you, be aware of where they are. Review your medicines with your doctor. Some medicines can make you feel dizzy. This can increase your chance of falling. Ask your doctor what other things that you can do to help prevent falls. This information is not intended to replace advice given to you by your health care provider. Make sure you discuss any questions you have with your health care provider. Document Released: 01/02/2009 Document Revised: 08/14/2015 Document Reviewed: 04/12/2014 Elsevier Interactive Patient Education  2017 [employer].

## 2021-02-13 ENCOUNTER — Other Ambulatory Visit: Payer: Self-pay | Admitting: Family Medicine

## 2021-02-13 DIAGNOSIS — J309 Allergic rhinitis, unspecified: Secondary | ICD-10-CM

## 2021-03-13 ENCOUNTER — Other Ambulatory Visit: Payer: Self-pay | Admitting: Family Medicine

## 2021-03-13 DIAGNOSIS — J309 Allergic rhinitis, unspecified: Secondary | ICD-10-CM

## 2021-03-18 ENCOUNTER — Telehealth: Payer: Self-pay | Admitting: Family Medicine

## 2021-03-18 ENCOUNTER — Other Ambulatory Visit: Payer: Self-pay | Admitting: Family Medicine

## 2021-03-18 DIAGNOSIS — J309 Allergic rhinitis, unspecified: Secondary | ICD-10-CM

## 2021-03-18 MED ORDER — MONTELUKAST SODIUM 10 MG PO TABS: ORAL_TABLET | ORAL | 0 refills | Status: DC

## 2021-03-18 MED ORDER — BISOPROLOL FUMARATE 5 MG PO TABS: 5.0000 mg | ORAL_TABLET | Freq: Every day | ORAL | 0 refills | Status: DC

## 2021-03-18 NOTE — Telephone Encounter (Signed)
Pt needing refill for until his appointment on 04/01/2021  montelukast (SINGULAIR) 10 MG tablet  bisoprolol (ZEBETA) 5 MG tablet montelukast (SINGULAIR) 10 MG tablet     [id number] - [place], [place] - [address] AT [place] [address] Phone:  [phone]  Fax:  [phone]

## 2021-03-18 NOTE — Telephone Encounter (Signed)
Pt was advised refill sent until next f/u appt and explained what next appt was for.

## 2021-04-01 ENCOUNTER — Ambulatory Visit (INDEPENDENT_AMBULATORY_CARE_PROVIDER_SITE_OTHER): Payer: PPO | Admitting: Family Medicine

## 2021-04-01 ENCOUNTER — Other Ambulatory Visit: Payer: Self-pay

## 2021-04-01 ENCOUNTER — Encounter: Payer: Self-pay | Admitting: Family Medicine

## 2021-04-01 VITALS — BP 122/72 | HR 57 | Temp 97.8°F | Ht 69.09 in | Wt 190.6 lb

## 2021-04-01 DIAGNOSIS — F17200 Nicotine dependence, unspecified, uncomplicated: Secondary | ICD-10-CM

## 2021-04-01 DIAGNOSIS — L814 Other melanin hyperpigmentation: Secondary | ICD-10-CM | POA: Diagnosis not present

## 2021-04-01 DIAGNOSIS — E039 Hypothyroidism, unspecified: Secondary | ICD-10-CM | POA: Diagnosis not present

## 2021-04-01 DIAGNOSIS — L570 Actinic keratosis: Secondary | ICD-10-CM | POA: Diagnosis not present

## 2021-04-01 DIAGNOSIS — L578 Other skin changes due to chronic exposure to nonionizing radiation: Secondary | ICD-10-CM | POA: Diagnosis not present

## 2021-04-01 DIAGNOSIS — D1801 Hemangioma of skin and subcutaneous tissue: Secondary | ICD-10-CM | POA: Diagnosis not present

## 2021-04-01 DIAGNOSIS — R7303 Prediabetes: Secondary | ICD-10-CM | POA: Diagnosis not present

## 2021-04-01 DIAGNOSIS — B078 Other viral warts: Secondary | ICD-10-CM | POA: Diagnosis not present

## 2021-04-01 DIAGNOSIS — L309 Dermatitis, unspecified: Secondary | ICD-10-CM | POA: Diagnosis not present

## 2021-04-01 DIAGNOSIS — E7800 Pure hypercholesterolemia, unspecified: Secondary | ICD-10-CM | POA: Diagnosis not present

## 2021-04-01 DIAGNOSIS — Z23 Encounter for immunization: Secondary | ICD-10-CM

## 2021-04-01 DIAGNOSIS — L821 Other seborrheic keratosis: Secondary | ICD-10-CM | POA: Diagnosis not present

## 2021-04-01 LAB — COMPREHENSIVE METABOLIC PANEL
ALT: 25 U/L (ref 0–53)
AST: 19 U/L (ref 0–37)
Albumin: 4.4 g/dL (ref 3.5–5.2)
Alkaline Phosphatase: 109 U/L (ref 39–117)
BUN: 14 mg/dL (ref 6–23)
CO2: 31 mEq/L (ref 19–32)
Calcium: 9.8 mg/dL (ref 8.4–10.5)
Chloride: 103 mEq/L (ref 96–112)
Creatinine, Ser: 1.05 mg/dL (ref 0.40–1.50)
GFR: 71.13 mL/min (ref >60.00)
Glucose, Bld: 86 mg/dL (ref 70–99)
Potassium: 4.2 mEq/L (ref 3.5–5.1)
Sodium: 142 mEq/L (ref 135–145)
Total Bilirubin: 0.8 mg/dL (ref 0.2–1.2)
Total Protein: 6.9 g/dL (ref 6.0–8.3)

## 2021-04-01 LAB — TSH: TSH: 1.07 u[IU]/mL (ref 0.35–5.50)

## 2021-04-01 LAB — HEMOGLOBIN A1C: Hgb A1c MFr Bld: 6.2 % (ref 4.6–6.5)

## 2021-04-01 MED ORDER — FLUTICASONE PROPIONATE 50 MCG/ACT NA SUSP: 2.0000 | Freq: Every day | NASAL | 3 refills | Status: AC

## 2021-04-01 MED ORDER — ZOSTER VAC RECOMB ADJUVANTED 50 MCG/0.5ML IM SUSR: 0.5000 mL | Freq: Once | INTRAMUSCULAR | 1 refills | Status: AC

## 2021-04-01 MED ORDER — ATORVASTATIN CALCIUM 80 MG PO TABS: 80.0000 mg | ORAL_TABLET | Freq: Every day | ORAL | 3 refills | Status: DC

## 2021-04-01 MED ORDER — MONTELUKAST SODIUM 10 MG PO TABS: ORAL_TABLET | ORAL | 3 refills | Status: DC

## 2021-04-01 MED ORDER — METFORMIN HCL 500 MG PO TABS: 500.0000 mg | ORAL_TABLET | Freq: Every day | ORAL | 3 refills | Status: DC

## 2021-04-01 MED ORDER — BISOPROLOL FUMARATE 5 MG PO TABS: 5.0000 mg | ORAL_TABLET | Freq: Every day | ORAL | 3 refills | Status: DC

## 2021-04-01 MED ORDER — EZETIMIBE 10 MG PO TABS: 10.0000 mg | ORAL_TABLET | Freq: Every day | ORAL | 3 refills | Status: DC

## 2021-04-01 NOTE — Progress Notes (Signed)
OFFICE VISIT  04/01/2021  CC:  Chief Complaint  Patient presents with   Follow-up    RCI; not fasting    Patient is a 71 y.o. male who presents for 6 mo f/u hypothyroidism, prediabetes, HLD. A/P as of last visit: "1) Hypothyroidism: compliant with 112 mcg T4 qd. TSH today.   2) Prediabetes: last a1c while on metformin was 5.6 (11 mo ago). Hba1c today.   3) HLD: tolerating atorva 80 qd and zetia 10 qd.   LDL 55 about 1 mo ago, excellent. Cont atorva 80 qd and zetia 10 qd.   4) Tobacco dependence:  not contemplating quitting at this time, admits he uses this as a crutch for stress, etc. Encouraged complete cessation today.   5) Health maintenance exam: Reviewed age and gender appropriate health maintenance issues (prudent diet, regular exercise, health risks of tobacco and excessive alcohol, use of seatbelts, fire alarms in home, use of sunscreen).  Also reviewed age and gender appropriate health screening as well as vaccine recommendations. Vaccines: Shingrix discussed->he defers for now.  Otherwise all UTD. Labs: cbc, cmet, tsh, psa (lipids done 1 mo ago). Prostate ca screening: shared decision-making process utilized today and he chooses to no longer screen for this. Colon ca screening: recall 07/2021. Lung cancer screening: he qualifies->he declines."  INTERIM HX: Jhan says he is doing well. No home blood pressure monitoring or sugar monitoring.  Admits he has some work to do regarding improving his diet. He is active but no formal exercise.  Leaving soon to visit his son in [place], [place], will be staying 10 days.  Not going to high altitude.    Past Medical History:  Diagnosis Date   Allergy    Aortic root enlargement ([place])    CTA chest/aorta 04/2017:  Mild enlargement of aortic root, measuring 3.9 x 3.8 x 3.6 cm.   Arthralgia of both knees 09/2015   Plain films with minimal DJD changes medial compartment, o/w nl.   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  Rpt u/s 3 yrs.   Fatigue due to treatment    better when lopressor dose was decreased to 12.5mg  bid 04/2017.   Hearing loss    Bilat sensorineural; [facility] ENT eval 07/2019; pt candidate for cochlear implants   History of adenomatous polyp of colon 05/19/2016   Recall 04/2021 (Dr. Havery Moros)   Hyperlipidemia    Hypothyroidism    Nephrolithiasis    passed one stone approx 2008; no prob since (saw urologist briefly). UC visit 06/2020 R flank pain, stone suspected.   Normal nuclear stress test 2012   Prediabetes 02/2016   HbA1c 6.1%.  2019 A1c 6.3%. 2022 A1c 6.3%   Prostatitis    When pt in 43s and 40s; saw Dr. Gaynelle Arabian and eventually got a TURP per pt's description.  No probs since then.   Recurrent sinusitis    Sinus plain films 08/2014: bilat maxillary sinusitis (chronic)   S/P coronary artery stent placement 2005   RCA and LCX.  As of 02/2018 cardiology f/u, plan is to get ETT (pt doing fine, but was not having chest pain prior to last stent placement).   Tobacco dependence    current as of 01/2019    Past Surgical History:  Procedure Laterality Date   Aortic ultrasound  09/07/2018   Ectatic abd aorta (3.4 cm max diameter) but no aneurism.  Rpt u/s 3 yrs.   APPENDECTOMY     CARDIAC CATHETERIZATION  04/16/2003   NORMAL. EF 60%. TOTALLY OCCLUDED LEFT CIRCUMFLEX WITH SEVERE DISEASE, AND ULCERATED PLAQUE IN THE PROXIMAL RIGHT CORONARY ARTERY AND MILD ATHERSCLEROSIS IN THE LEFT ANTERIOR DESCENDING   CARDIOVASCULAR STRESS TEST  2009;2012; 10/2014; 03/2018   2012 normal nuclear stress test.  2016 ETT no ischemia.  ETT normal 03/2018.   COLONOSCOPY W/ POLYPECTOMY  05/19/2016   Multiple polyps--tubular adenoma.  Recall 5 yrs (04/2021).   CORONARY STENT PLACEMENT  04/19/2003   STENT PLACEMENT IN THE LEFT CIRCUMFLEX CORONARY AND RIGHT CORONARY ARTERY.  MILD RESIDUAL STENOSIS IN THE MID PORTION OF THE LEFT ANTERIOR DESCENDING   TRANSTHORACIC ECHOCARDIOGRAM  05/03/2017   EF 60-65%, normal valves, mildly enlarged aortic root.    Outpatient Medications Prior to Visit  Medication Sig Dispense Refill   amoxicillin (AMOXIL) 875 MG tablet Take by mouth.     aspirin 81 MG tablet Take 81 mg by mouth daily.     Cyanocobalamin (VITAMIN B-12 PO) Take 1 tablet by mouth daily.     levothyroxine (SYNTHROID) 112 MCG tablet Take 1 tablet (112 mcg total) by mouth daily. 90 tablet 3   atorvastatin (LIPITOR) 80 MG tablet TAKE 1 TABLET BY MOUTH EVERY DAY 90 tablet 3   bisoprolol (ZEBETA) 5 MG tablet Take 1 tablet (5 mg total) by mouth daily. 30 tablet 0   ezetimibe (ZETIA) 10 MG tablet Take 1 tablet (10 mg total) by mouth daily. 90 tablet 3   fluticasone (FLONASE) 50 MCG/ACT nasal spray Place 2 sprays into both nostrils daily. 16 g 11   metFORMIN (GLUCOPHAGE) 500 MG tablet TAKE 1 TABLET (500 MG TOTAL) BY MOUTH DAILY WITH SUPPER. 90 tablet 1   montelukast (SINGULAIR) 10 MG tablet TAKE 1 TABLET BY MOUTH EVERYDAY AT BEDTIME 30 tablet 0   traMADol (ULTRAM) 50 MG tablet Take 50 mg by mouth every 4 (four) hours as needed. (Patient not taking: Reported on 04/01/2021)     Facility-Administered Medications Prior to Visit  Medication Dose Route Frequency Provider Last Rate Last Admin   0.9 %  sodium chloride infusion  500 mL Intravenous Continuous Armbruster, Carlota Raspberry, MD        Allergies  Allergen Reactions   Plavix [Clopidogrel Bisulfate] Rash    ROS As per HPI  PE: Vitals with BMI 04/01/2021 11/12/2020 09/10/2020  Height 5' 9.09" (No Data) 5' 9.094"  Weight 190 lbs 10 oz (No Data) 187 lbs  BMI 62.70 - 35.00  Systolic 938 (No Data) 182  Diastolic 72 (No Data) 67  Pulse 57 - 62     Physical Exam  Gen: Alert, well appearing.  Patient is oriented to person, place, time, and situation. AFFECT: pleasant, lucid thought and speech. CV: RRR, no m/r/g.    LUNGS: CTA bilat, nonlabored resps, good aeration in all lung fields. EXT: no clubbing or cyanosis.  no edema.    LABS:  Last CBC Lab Results  Component Value Date   WBC 8.2 09/10/2020   HGB 15.8 09/10/2020   HCT 46.6 09/10/2020   MCV 86.7 09/10/2020   MCH 29.2 06/06/2019   RDW 14.9 09/10/2020   PLT 178.0 99/37/1696   Last metabolic panel Lab Results  Component Value Date   GLUCOSE 90 09/10/2020   NA 140 09/10/2020   K 4.2 09/10/2020   CL 104 09/10/2020   CO2  24 09/10/2020   BUN 14 09/10/2020   CREATININE 0.95 09/10/2020   GFRNONAA 81 05/05/2020   CALCIUM 9.4 09/10/2020   PROT 6.5 09/10/2020   ALBUMIN 4.3 09/10/2020   BILITOT 1.1 09/10/2020   ALKPHOS 99 09/10/2020   AST 19 09/10/2020   ALT 21 09/10/2020   Last lipids Lab Results  Component Value Date   CHOL 110 08/13/2020   HDL 39 (L) 08/13/2020   LDLCALC 55 08/13/2020   TRIG 78 08/13/2020   CHOLHDL 2.8 08/13/2020   Lab Results  Component Value Date   PSA 1.48 02/01/2019   PSA 1.34 03/18/2016   Last hemoglobin A1c Lab Results  Component Value Date   HGBA1C 6.3 09/10/2020   Last thyroid functions Lab Results  Component Value Date   TSH 0.74 09/10/2020   T3TOTAL 89.0 03/19/2016   IMPRESSION AND PLAN:  #1 prediabetes. Knows he needs to work on improving diet.  Maximize activity. Hemoglobin A1c and nonfasting glucose today. Continue metformin 500 nightly.  #2 hyperlipidemia: Zetia 10 mg a day and atorvastatin 80 mg a day.   LDL was 55 about 6 months ago. Not fasting today. Next of his panel at follow-up in 6 months.  Hepatic panel today.  #3 hypothyroidism.  Has been stable on levothyroxine 112 mcg daily. TSH today.  #4 tobacco dependence: We discussed use of nicotine replacement, which she is interested in doing now. Recommended 21 mg nicotine patch x1 month, stepdown monthly from 14 milligrams to 7 mg 7 and then off.  Use of nicotine gum or lozenge as needed craving discussed.  An After  Visit Summary was printed and given to the patient.  FOLLOW UP: Return in about 6 months (around 09/29/2021) for annual CPE (fasting).  Signed:  Crissie Sickles, MD           04/01/2021

## 2021-06-27 ENCOUNTER — Other Ambulatory Visit: Payer: Self-pay | Admitting: Family Medicine

## 2021-08-18 NOTE — Progress Notes (Unsigned)
No chief complaint on file.  History of Present Illness: 71 yo male with history of CAD, HLD, tobacco abuse and hypothyroidism here today for cardiac follow up. He had a NSTEMI in January 2005 and had a Cypher drug eluting stent placed in the occluded Circumflex and a Cypher drug eluting stent placed in the RCA. He has had no further caths since then. Stress myoview in July 2012 showed no ischemia. Exercise stress test August 2016 with no ischemia. Echo February 2019 with LVEF=60-65%, LVH. The aortic root was mildly dilated. Chest CTA with no evidence of aneurysm or dissection of the aorta. Exercise stress test January 2020 with no ischemic EKG changes.   He is here today for follow up. The patient denies any chest pain, dyspnea, palpitations, lower extremity edema, orthopnea, PND, dizziness, near syncope or syncope.     Primary Care Physician: Tammi Sou, MD  Past Medical History:  Diagnosis Date   Allergy    Aortic root enlargement [facility])    CTA chest/aorta 04/2017:  Mild enlargement of aortic root, measuring 3.9 x 3.8 x 3.6 cm.   Arthralgia of both knees 09/2015   Plain films with minimal DJD changes medial compartment, o/w nl.   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  Rpt u/s 3 yrs.   Fatigue due to treatment    better when lopressor dose was decreased to 12.'5mg'$  bid 04/2017.   Hearing loss    Bilat sensorineural; [facility] ENT eval 07/2019; pt candidate for cochlear implants   History of adenomatous polyp of colon 05/19/2016   Recall 04/2021 (Dr. Havery Moros)   Hyperlipidemia    Hypothyroidism    Nephrolithiasis    passed one stone approx 2008; no prob since (saw urologist briefly). UC visit 06/2020 R flank pain, stone suspected.   Normal nuclear stress test 2012   Prediabetes 02/2016   HbA1c 6.1%.  2019 A1c 6.3%. 2022 A1c  6.3%   Prostatitis    When pt in 75s and 40s; saw Dr. Gaynelle Arabian and eventually got a TURP per pt's description.  No probs since then.   Recurrent sinusitis    Sinus plain films 08/2014: bilat maxillary sinusitis (chronic)   S/P coronary artery stent placement 2005   RCA and LCX.  As of 02/2018 cardiology f/u, plan is to get ETT (pt doing fine, but was not having chest pain prior to last stent placement).   Tobacco dependence    current as of 01/2019    Past Surgical History:  Procedure Laterality Date   Aortic ultrasound  09/07/2018   Ectatic abd aorta (3.4 cm max diameter) but no aneurism.  Rpt u/s 3 yrs.   APPENDECTOMY     CARDIAC CATHETERIZATION  04/16/2003   NORMAL. EF 60%. TOTALLY OCCLUDED LEFT CIRCUMFLEX WITH SEVERE DISEASE, AND ULCERATED PLAQUE IN THE PROXIMAL RIGHT CORONARY ARTERY AND MILD ATHERSCLEROSIS IN THE LEFT ANTERIOR DESCENDING   CARDIOVASCULAR STRESS TEST  2009;2012; 10/2014; 03/2018   2012 normal nuclear stress test.  2016 ETT no ischemia.  ETT normal 03/2018.   COLONOSCOPY W/ POLYPECTOMY  05/19/2016   Multiple polyps--tubular adenoma.  Recall 5 yrs (04/2021).   CORONARY STENT PLACEMENT  04/19/2003   STENT PLACEMENT IN THE LEFT CIRCUMFLEX CORONARY AND RIGHT CORONARY ARTERY. MILD RESIDUAL STENOSIS IN THE MID  PORTION OF THE LEFT ANTERIOR DESCENDING   TRANSTHORACIC ECHOCARDIOGRAM  05/03/2017   EF 60-65%, normal valves, mildly enlarged aortic root.    Current Outpatient Medications  Medication Sig Dispense Refill   amoxicillin (AMOXIL) 875 MG tablet Take by mouth.     aspirin 81 MG tablet Take 81 mg by mouth daily.     atorvastatin (LIPITOR) 80 MG tablet Take 1 tablet (80 mg total) by mouth daily. 90 tablet 3   bisoprolol (ZEBETA) 5 MG tablet Take 1 tablet (5 mg total) by mouth daily. 90 tablet 3   Cyanocobalamin (VITAMIN B-12 PO) Take 1 tablet by mouth daily.     ezetimibe (ZETIA) 10 MG tablet Take 1 tablet (10 mg total) by mouth daily. 90 tablet 3   fluticasone (FLONASE)  50 MCG/ACT nasal spray Place 2 sprays into both nostrils daily. 48 g 3   levothyroxine (SYNTHROID) 112 MCG tablet TAKE 1 TABLET BY MOUTH EVERY DAY 90 tablet 0   metFORMIN (GLUCOPHAGE) 500 MG tablet Take 1 tablet (500 mg total) by mouth daily with supper. 90 tablet 3   montelukast (SINGULAIR) 10 MG tablet TAKE 1 TABLET BY MOUTH EVERYDAY AT BEDTIME 90 tablet 3   traMADol (ULTRAM) 50 MG tablet Take 50 mg by mouth every 4 (four) hours as needed. (Patient not taking: Reported on 04/01/2021)     Current Facility-Administered Medications  Medication Dose Route Frequency Provider Last Rate Last Admin   0.9 %  sodium chloride infusion  500 mL Intravenous Continuous Armbruster, Carlota Raspberry, MD        Allergies  Allergen Reactions   Plavix [Clopidogrel Bisulfate] Rash    Social History   Socioeconomic History   Marital status: Married    Spouse name: Not on file   Number of children: 2   Years of education: Not on file   Highest education level: Not on file  Occupational History   Occupation: Sales Rep  Tobacco Use   Smoking status: Every Day    Packs/day: 1.00    Types: Cigarettes   Smokeless tobacco: Never  Vaping Use   Vaping Use: Never used  Substance and Sexual Activity   Alcohol use: No   Drug use: No   Sexual activity: Yes  Other Topics Concern   Not on file  Social History Narrative   Married, 1 son in [place] and one in [place].   Lives in [place].   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   Tob: 40 pack-yr hx, current as of 08/2020.   Alcohol: social/rare.   Social Determinants of Health   Financial Resource Strain: Low Risk    Difficulty of Paying Living Expenses: Not hard at all  Food Insecurity: No Food Insecurity   Worried About Charity fundraiser in the Last Year: Never true   [place] in the Last Year: Never true  Transportation Needs: No Transportation Needs   Lack of Transportation (Medical):  No   Lack of Transportation (Non-Medical): No  Physical Activity: Insufficiently Active   Days of Exercise per Week: 4 days   Minutes of Exercise per Session: 30 min  Stress: No Stress Concern Present   Feeling of Stress : Not at all  Social Connections: Moderately Integrated   Frequency of Communication with Friends and Family: More than three times a week   Frequency of Social Gatherings with Friends and Family: More than three times a week  Attends Religious Services: 1 to 4 times per year   Active Member of Clubs or Organizations: No   Attends Archivist Meetings: Never   Marital Status: Married  Human resources officer Violence: Not At Risk   Fear of Current or Ex-Partner: No   Emotionally Abused: No   Physically Abused: No   Sexually Abused: No    Family History  Problem Relation Age of Onset   Hypertension Mother    Stroke Mother    Hypertension Father    Stroke Father    Prostate cancer Father    Heart attack Brother        STENT   Colon cancer Neg Hx     Review of Systems:  As stated in the HPI and otherwise negative.   There were no vitals taken for this visit.  Physical Examination:  General: Well developed, well nourished, NAD  HEENT: OP clear, mucus membranes moist  SKIN: warm, dry. No rashes. Neuro: No focal deficits  Musculoskeletal: Muscle strength 5/5 all ext  Psychiatric: Mood and affect normal  Neck: No JVD, no carotid bruits, no thyromegaly, no lymphadenopathy.  Lungs:Clear bilaterally, no wheezes, rhonci, crackles Cardiovascular: Regular rate and rhythm. No murmurs, gallops or rubs. Abdomen:Soft. Bowel sounds present. Non-tender.  Extremities: No lower extremity edema. Pulses are 2 + in the bilateral DP/PT.  Echo February 2019: Left ventricle: The cavity size was normal. There was severe   focal basal and moderate concentric hypertrophy. Systolic   function was normal. The estimated ejection fraction was in the   range of 60% to 65%.  Wall motion was normal; there were no   regional wall motion abnormalities. Left ventricular diastolic   function parameters were normal. - Aortic valve: Moderate focal calcification involving the   noncoronary cusp. - Aorta: Aortic root dimension: 42 mm (ED). Ascending aortic   diameter: 39 mm (S). - Aortic root: The aortic root was mildly dilated. - Ascending aorta: The ascending aorta was mildly dilated. - Left atrium: The atrium was moderately dilated.  EKG:  EKG is *** ordered today. The ekg ordered today demonstrates    Recent Labs: 09/10/2020: Hemoglobin 15.8; Platelets 178.0 04/01/2021: ALT 25; BUN 14; Creatinine, Ser 1.05; Potassium 4.2; Sodium 142; TSH 1.07   Lipid Panel    Component Value Date/Time   CHOL 110 08/13/2020 0818   TRIG 78 08/13/2020 0818   HDL 39 (L) 08/13/2020 0818   CHOLHDL 2.8 08/13/2020 0818   CHOLHDL 3 02/01/2019 0935   VLDL 26.4 02/01/2019 0935   LDLCALC 55 08/13/2020 0818     Wt Readings from Last 3 Encounters:  04/01/21 190 lb 9.6 oz (86.5 kg)  09/10/20 187 lb (84.8 kg)  05/05/20 200 lb (90.7 kg)     Other studies Reviewed: Additional studies/ records that were reviewed today include: . Review of the above records demonstrates:    Assessment and Plan:   1. CAD without angina: LV function normal by echo in February 2019. Normal exercise stress test January 2020. He has no chest pain. Will continue ASA, beta blocker and statin.   2. Tobacco abuse: Smoking cessation encouraged  3. Hyperlipidemia: LDL at goal in May 2022. Continue statin. *** Will check lipids and LFTS now   Current medicines are reviewed at length with the patient today.  The patient does not have concerns regarding medicines.  The following changes have been made:  no change  Labs/ tests ordered today include:   No orders of the defined types  were placed in this encounter.   Disposition:   F/U with me in 12 months  Signed, Lauree Chandler, MD 08/18/2021  7:46 PM    [address], [place], [place]  [address] Phone: [phone]; Fax: [phone]

## 2021-08-19 ENCOUNTER — Encounter: Payer: Self-pay | Admitting: Cardiovascular Disease

## 2021-08-19 ENCOUNTER — Ambulatory Visit: Payer: PPO | Admitting: Cardiovascular Disease

## 2021-08-19 VITALS — BP 120/70 | HR 58 | Ht 69.09 in | Wt 190.6 lb

## 2021-08-19 DIAGNOSIS — E7800 Pure hypercholesterolemia, unspecified: Secondary | ICD-10-CM

## 2021-08-19 DIAGNOSIS — I2510 Atherosclerotic heart disease of native coronary artery without angina pectoris: Secondary | ICD-10-CM | POA: Diagnosis not present

## 2021-08-19 MED ORDER — ATORVASTATIN CALCIUM 80 MG PO TABS: 80.0000 mg | ORAL_TABLET | Freq: Every day | ORAL | 3 refills | Status: DC

## 2021-08-19 NOTE — Patient Instructions (Signed)
Medication Instructions:  No changes *If you need a refill on your cardiac medications before your next appointment, please call your pharmacy*   Lab Work: Please return for fasting labs (lipids/liver function)  If you have labs (blood work) drawn today and your tests are completely normal, you will receive your results only by: [place] (if you have MyChart) OR A paper copy in the mail If you have any lab test that is abnormal or we need to change your treatment, we will call you to review the results.   Testing/Procedures: none   Follow-Up: At [facility], you and your health needs are our priority.  As part of our continuing mission to provide you with exceptional heart care, we have created designated Provider Care Teams.  These Care Teams include your primary Cardiologist (physician) and Advanced Practice Providers (APPs -  Physician Assistants and Nurse Practitioners) who all work together to provide you with the care you need, when you need it.  Your next appointment:   12 month(s)  The format for your next appointment:   In Person  Provider:   Lauree Chandler, MD   Important Information About Sugar

## 2021-08-21 ENCOUNTER — Other Ambulatory Visit: Payer: PPO | Admitting: *Deleted

## 2021-08-21 DIAGNOSIS — E7800 Pure hypercholesterolemia, unspecified: Secondary | ICD-10-CM | POA: Diagnosis not present

## 2021-08-21 DIAGNOSIS — I2510 Atherosclerotic heart disease of native coronary artery without angina pectoris: Secondary | ICD-10-CM

## 2021-08-21 LAB — LIPID PANEL
Chol/HDL Ratio: 2.8 ratio (ref 0.0–5.0)
Cholesterol, Total: 114 mg/dL (ref 100–199)
HDL: 41 mg/dL (ref >39)
LDL Chol Calc (NIH): 52 mg/dL (ref 0–99)
Triglycerides: 113 mg/dL (ref 0–149)
VLDL Cholesterol Cal: 21 mg/dL (ref 5–40)

## 2021-08-21 LAB — HEPATIC FUNCTION PANEL
ALT: 21 IU/L (ref 0–44)
AST: 18 IU/L (ref 0–40)
Albumin: 4.3 g/dL (ref 3.7–4.7)
Alkaline Phosphatase: 110 IU/L (ref 44–121)
Bilirubin Total: 0.8 mg/dL (ref 0.0–1.2)
Bilirubin, Direct: 0.25 mg/dL (ref 0.00–0.40)
Total Protein: 6.4 g/dL (ref 6.0–8.5)

## 2021-09-23 ENCOUNTER — Other Ambulatory Visit: Payer: Self-pay | Admitting: Family Medicine

## 2021-10-17 ENCOUNTER — Other Ambulatory Visit: Payer: Self-pay | Admitting: Family Medicine

## 2021-10-28 ENCOUNTER — Encounter: Payer: Self-pay | Admitting: Family Medicine

## 2021-10-28 ENCOUNTER — Ambulatory Visit (INDEPENDENT_AMBULATORY_CARE_PROVIDER_SITE_OTHER): Payer: PPO | Admitting: Family Medicine

## 2021-10-28 VITALS — BP 97/54 | HR 60 | Temp 97.9°F | Ht 69.09 in | Wt 189.2 lb

## 2021-10-28 DIAGNOSIS — E039 Hypothyroidism, unspecified: Secondary | ICD-10-CM

## 2021-10-28 DIAGNOSIS — F17200 Nicotine dependence, unspecified, uncomplicated: Secondary | ICD-10-CM | POA: Diagnosis not present

## 2021-10-28 DIAGNOSIS — E7800 Pure hypercholesterolemia, unspecified: Secondary | ICD-10-CM

## 2021-10-28 DIAGNOSIS — I2510 Atherosclerotic heart disease of native coronary artery without angina pectoris: Secondary | ICD-10-CM | POA: Diagnosis not present

## 2021-10-28 DIAGNOSIS — R7303 Prediabetes: Secondary | ICD-10-CM | POA: Diagnosis not present

## 2021-10-28 DIAGNOSIS — Z23 Encounter for immunization: Secondary | ICD-10-CM

## 2021-10-28 DIAGNOSIS — Z0000 Encounter for general adult medical examination without abnormal findings: Secondary | ICD-10-CM

## 2021-10-28 LAB — BASIC METABOLIC PANEL
BUN: 14 mg/dL (ref 6–23)
CO2: 28 mEq/L (ref 19–32)
Calcium: 9.1 mg/dL (ref 8.4–10.5)
Chloride: 106 mEq/L (ref 96–112)
Creatinine, Ser: 0.94 mg/dL (ref 0.40–1.50)
GFR: 80.9 mL/min (ref >60.00)
Glucose, Bld: 100 mg/dL — ABNORMAL HIGH (ref 70–99)
Potassium: 4.1 mEq/L (ref 3.5–5.1)
Sodium: 141 mEq/L (ref 135–145)

## 2021-10-28 LAB — CBC
HCT: 47.4 % (ref 39.0–52.0)
Hemoglobin: 16 g/dL (ref 13.0–17.0)
MCHC: 33.7 g/dL (ref 30.0–36.0)
MCV: 86.9 fl (ref 78.0–100.0)
Platelets: 167 10*3/uL (ref 150.0–400.0)
RBC: 5.46 Mil/uL (ref 4.22–5.81)
RDW: 14.6 % (ref 11.5–15.5)
WBC: 8.4 10*3/uL (ref 4.0–10.5)

## 2021-10-28 LAB — POCT GLYCOSYLATED HEMOGLOBIN (HGB A1C)
HbA1c POC (<> result, manual entry): 5.7 % (ref 4.0–5.6)
HbA1c, POC (controlled diabetic range): 5.7 % (ref 0.0–7.0)
HbA1c, POC (prediabetic range): 5.7 % (ref 5.7–6.4)
Hemoglobin A1C: 5.7 % — AB (ref 4.0–5.6)

## 2021-10-28 MED ORDER — LEVOTHYROXINE SODIUM 112 MCG PO TABS: 112.0000 ug | ORAL_TABLET | Freq: Every day | ORAL | 1 refills | Status: DC

## 2021-10-28 MED ORDER — SHINGRIX 50 MCG/0.5ML IM SUSR: 0.5000 mL | Freq: Once | INTRAMUSCULAR | 0 refills | Status: AC

## 2021-10-28 NOTE — Progress Notes (Signed)
Office Note 10/28/2021  CC:  Chief Complaint  Patient presents with   Prediabetes   Hyperlipidemia    Pt is not fasting   HPI:  Patient is a 71 y.o. male who is here for annual health maintenance exam and 44-monthfollow-up hypothyroidism, hyperlipidemia, and prediabetes. A/P as of last visit: "#1 prediabetes. Knows he needs to work on improving diet.  Maximize activity. Hemoglobin A1c and nonfasting glucose today. Continue metformin 500 nightly.   #2 hyperlipidemia: Zetia 10 mg a day and atorvastatin 80 mg a day.   LDL was 55 about 6 months ago. Not fasting today. Next of his panel at follow-up in 6 months.  Hepatic panel today.   #3 hypothyroidism.  Has been stable on levothyroxine 112 mcg daily. TSH today.   #4 tobacco dependence: We discussed use of nicotine replacement, which she is interested in doing now. Recommended 21 mg nicotine patch x1 month, stepdown monthly from 14 milligrams to 7 mg 7 and then off.  Use of nicotine gum or lozenge as needed craving discussed."  INTERIM HX: KYvone Neusays he is feeling well. He continues to smoke cigarettes and says he is still trying to get full motivation to quit.  Lipids were at goal levels and hepatic panel normal at his cardiology f/u visit 08/19/21.   Past Medical History:  Diagnosis Date   Allergy    Aortic root enlargement ([place]    CTA chest/aorta 04/2017:  Mild enlargement of aortic root, measuring 3.9 x 3.8 x 3.6 cm.   Arthralgia of both knees 09/2015   Plain films with minimal DJD changes medial compartment, o/w nl.   Arthritis    Claudication ([place]    Normal ABI's in 2006   Coronary artery disease    s/p stents   [place] 08/[place]   Ectatic abd aorta (3.4 cm max diameter) but no aneurism.  Rpt u/s 3 yrs.   Fatigue due to treatment    better when lopressor dose was decreased to 12.'5mg'$  bid 04/2017.   Hearing loss    [facility]    History of adenomatous polyp of colon 05/19/2016   Recall 04/2021 (Dr. AHavery Moros   Hyperlipidemia    Hypothyroidism    Nephrolithiasis    passed one stone approx 2008; no prob since (saw urologist briefly). UC visit 06/2020 R flank pain, stone suspected.   Normal nuclear stress test 2012   Prediabetes 02/2016   HbA1c 6.1%.  2019 A1c 6.3%. 2022 A1c 6.3%   Prostatitis    When pt in 343sand 40s; saw Dr. TGaynelle Arabianand eventually got a TURP per pt's description.  No probs since then.   Recurrent sinusitis    Sinus plain films 08/2014: bilat maxillary sinusitis (chronic)   S/P coronary artery stent placement 2005   RCA and LCX.  As of 02/2018 cardiology f/u, plan is to get ETT (pt doing fine, but was not having chest pain prior to last stent placement).   Tobacco dependence    current as of 01/2019    Past Surgical History:  Procedure Laterality Date   Aortic ultrasound  09/07/2018   Ectatic abd aorta (3.4 cm max diameter) but no aneurism.  Rpt u/s 3 yrs.   APPENDECTOMY     CARDIAC CATHETERIZATION  04/16/2003   NORMAL. EF 60%. TOTALLY OCCLUDED LEFT CIRCUMFLEX WITH SEVERE DISEASE, AND ULCERATED PLAQUE IN THE PROXIMAL RIGHT CORONARY ARTERY AND MILD ATHERSCLEROSIS IN THE LEFT ANTERIOR  DESCENDING   CARDIOVASCULAR STRESS TEST  2009;2012; 10/2014; 03/2018   2012 normal nuclear stress test.  2016 ETT no ischemia.  ETT normal 03/2018.   COLONOSCOPY W/ POLYPECTOMY  05/19/2016   Multiple polyps--tubular adenoma.  Recall 5 yrs (04/2021).   CORONARY STENT PLACEMENT  04/19/2003   STENT PLACEMENT IN THE LEFT CIRCUMFLEX CORONARY AND RIGHT CORONARY ARTERY. MILD RESIDUAL STENOSIS IN THE MID PORTION OF THE LEFT ANTERIOR DESCENDING   TRANSTHORACIC ECHOCARDIOGRAM  05/03/2017   EF 60-65%, normal valves, mildly enlarged aortic root.    Family History  Problem Relation Age of Onset   Hypertension Mother    Stroke Mother    Hypertension Father    Stroke Father    Prostate cancer Father    Heart attack Brother         STENT   Colon cancer Neg Hx     Social History   Socioeconomic History   Marital status: Married    Spouse name: Not on file   Number of children: 2   Years of education: Not on file   Highest education level: Not on file  Occupational History   Occupation: Sales Rep  Tobacco Use   Smoking status: Every Day    Packs/day: 1.00    Types: Cigarettes   Smokeless tobacco: Never  Vaping Use   Vaping Use: Never used  Substance and Sexual Activity   Alcohol use: No   Drug use: No   Sexual activity: Yes  Other Topics Concern   Not on file  Social History Narrative   Married, 1 son in [place] and one in [place].   Lives in [place].   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   Tob: 40 pack-yr hx, current as of 08/2020.   Alcohol: social/rare.   Social Determinants of Health   Financial Resource Strain: Low Risk  (11/12/2020)   Overall Financial Resource Strain (CARDIA)    Difficulty of Paying Living Expenses: Not hard at all  Food Insecurity: No Food Insecurity (11/12/2020)   Hunger Vital Sign    Worried About Running Out of Food in the Last Year: Never true    Ran Out of Food in the Last Year: Never true  Transportation Needs: No Transportation Needs (11/12/2020)   PRAPARE - Hydrologist (Medical): No    Lack of Transportation (Non-Medical): No  Physical Activity: Insufficiently Active (11/12/2020)   Exercise Vital Sign    Days of Exercise per Week: 4 days    Minutes of Exercise per Session: 30 min  Stress: No Stress Concern Present (11/12/2020)   [place]    Feeling of Stress : Not at all  Social Connections: Moderately Integrated (11/12/2020)   Social Connection and Isolation Panel [NHANES]    Frequency of Communication with Friends and Family: More than three times a week    Frequency of Social Gatherings with  Friends and Family: More than three times a week    Attends Religious Services: 1 to 4 times per year    Active Member of [employer] or Organizations: No    Attends Archivist Meetings: Never    Marital Status: Married  Human resources officer Violence: Not At Risk (11/12/2020)   Humiliation, Afraid, Rape, and Kick questionnaire    Fear of Current or Ex-Partner: No    Emotionally Abused: No    Physically Abused: No  Sexually Abused: No    Outpatient Medications Prior to Visit  Medication Sig Dispense Refill   aspirin 81 MG tablet Take 81 mg by mouth daily.     atorvastatin (LIPITOR) 80 MG tablet Take 1 tablet (80 mg total) by mouth daily. 90 tablet 3   bisoprolol (ZEBETA) 5 MG tablet Take 1 tablet (5 mg total) by mouth daily. 90 tablet 3   Cyanocobalamin (VITAMIN B-12 PO) Take 1 tablet by mouth daily.     ezetimibe (ZETIA) 10 MG tablet Take 1 tablet (10 mg total) by mouth daily. 90 tablet 3   fluticasone (FLONASE) 50 MCG/ACT nasal spray Place 2 sprays into both nostrils daily. 48 g 3   metFORMIN (GLUCOPHAGE) 500 MG tablet Take 1 tablet (500 mg total) by mouth daily with supper. 90 tablet 3   montelukast (SINGULAIR) 10 MG tablet TAKE 1 TABLET BY MOUTH EVERYDAY AT BEDTIME 90 tablet 3   levothyroxine (SYNTHROID) 112 MCG tablet TAKE 1 TABLET BY MOUTH EVERY DAY 30 tablet 0   Facility-Administered Medications Prior to Visit  Medication Dose Route Frequency Provider Last Rate Last Admin   0.9 %  sodium chloride infusion  500 mL Intravenous Continuous Armbruster, Carlota Raspberry, MD        Allergies  Allergen Reactions   Plavix [Clopidogrel Bisulfate] Rash    ROS Review of Systems  Constitutional:  Negative for appetite change, chills, fatigue and fever.  HENT:  Negative for congestion, dental problem, ear pain and sore throat.   Eyes:  Negative for discharge, redness and visual disturbance.  Respiratory:  Negative for cough, chest tightness, shortness of breath and wheezing.    Cardiovascular:  Negative for chest pain, palpitations and leg swelling.  Gastrointestinal:  Negative for abdominal pain, blood in stool, diarrhea, nausea and vomiting.  Genitourinary:  Negative for difficulty urinating, dysuria, flank pain, frequency, hematuria and urgency.  Musculoskeletal:  Negative for arthralgias, back pain, joint swelling, myalgias and neck stiffness.  Skin:  Negative for pallor and rash.  Neurological:  Negative for dizziness, speech difficulty, weakness and headaches.  Hematological:  Negative for adenopathy. Does not bruise/bleed easily.  Psychiatric/Behavioral:  Negative for confusion and sleep disturbance. The patient is not nervous/anxious.     PE;    10/28/2021    9:58 AM 08/19/2021   10:38 AM 04/01/2021    1:03 PM  Vitals with BMI  Height 5' 9.09" 5' 9.09" 5' 9.09"  Weight 189 lbs 3 oz 190 lbs 10 oz 190 lbs 10 oz  BMI 27.86 95.09 32.67  Systolic 97 124 580  Diastolic 54 70 72  Pulse 60 58 57   Gen: Alert, well appearing.  Patient is oriented to person, place, time, and situation. AFFECT: pleasant, lucid thought and speech. ENT: Ears: EACs clear, normal epithelium.  TMs with good light reflex and landmarks bilaterally.  Eyes: no injection, icteris, swelling, or exudate.  EOMI, PERRLA. Nose: no drainage or turbinate edema/swelling.  No injection or focal lesion.  Mouth: lips without lesion/swelling.  Oral mucosa pink and moist.  Dentition intact and without obvious caries or gingival swelling.  Oropharynx without erythema, exudate, or swelling.  Neck: supple/nontender.  No LAD, mass, or TM.  Carotid pulses 2+ bilaterally, without bruits. CV: RRR, no m/r/g.   LUNGS: CTA bilat, nonlabored resps, good aeration in all lung fields. ABD: soft, NT, ND, BS normal.  No hepatospenomegaly or mass.  No bruits. EXT: no clubbing, cyanosis, or edema.  Musculoskeletal: no joint swelling, erythema, warmth, or tenderness.  ROM of all joints intact. Skin - no sores or  suspicious lesions or rashes or color changes  Pertinent labs:  Lab Results  Component Value Date   TSH 1.07 04/01/2021   Lab Results  Component Value Date   WBC 8.2 09/10/2020   HGB 15.8 09/10/2020   HCT 46.6 09/10/2020   MCV 86.7 09/10/2020   PLT 178.0 09/10/2020   Lab Results  Component Value Date   CREATININE 1.05 04/01/2021   BUN 14 04/01/2021   NA 142 04/01/2021   K 4.2 04/01/2021   CL 103 04/01/2021   CO2 31 04/01/2021   Lab Results  Component Value Date   ALT 21 08/21/2021   AST 18 08/21/2021   ALKPHOS 110 08/21/2021   BILITOT 0.8 08/21/2021   Lab Results  Component Value Date   CHOL 114 08/21/2021   Lab Results  Component Value Date   HDL 41 08/21/2021   Lab Results  Component Value Date   LDLCALC 52 08/21/2021   Lab Results  Component Value Date   TRIG 113 08/21/2021   Lab Results  Component Value Date   CHOLHDL 2.8 08/21/2021   Lab Results  Component Value Date   PSA 1.48 02/01/2019   PSA 1.34 03/18/2016   Lab Results  Component Value Date   HGBA1C 5.7 (A) 10/28/2021   HGBA1C 5.7 10/28/2021   HGBA1C 5.7 10/28/2021   HGBA1C 5.7 10/28/2021   ASSESSMENT AND PLAN:   #1 prediabetes. Hemoglobin A1c today is 5.7%.  Continue metformin 500 mg a day and healthy diet.  #2 tobacco dependence. Encourage cessation.  3.  Hyperlipidemia, LDL goal less than 70. LDL was 52 approximately 2 months ago. Continue atorvastatin 80 mg a day and Zetia 10 mg a day.  #4 hypothyroidism Hypoth: Takes T4 on empty stomach w/out any other meds. TSH was 1.07 in January this year. We have been doing annual checks and they have all been stable. Will do next TSH check in 6 months.  #5 Health maintenance exam: Reviewed age and gender appropriate health maintenance issues (prudent diet, regular exercise, health risks of tobacco and excessive alcohol, use of seatbelts, fire alarms in home, use of sunscreen).  Also reviewed age and gender appropriate health  screening as well as vaccine recommendations. Vaccines: shingrix->rx sent to pharmacy. otherwise up-to-date Labs: CBC, BMet, point-of-care hemoglobin A1c Prostate ca screening: Per shared decision making process back in 2020 he has decided that he wants no further prostate cancer screening. Colon ca screening: recall as of 04/2021--> patient considering waiting till the beginning of next year to call and arrange this. Lung cancer screening: Patient declined at this time  An After Visit Summary was printed and given to the patient.  FOLLOW UP:  Return in about 6 months (around 04/30/2022) for routine chronic illness f/u.  Signed:  Crissie Sickles, MD           10/28/2021

## 2021-10-28 NOTE — Patient Instructions (Signed)
Health Maintenance, Male Adopting a healthy lifestyle and getting preventive care are important in promoting health and wellness. Ask your health care provider about: The right schedule for you to have regular tests and exams. Things you can do on your own to prevent diseases and keep yourself healthy. What should I know about diet, weight, and exercise? Eat a healthy diet  Eat a diet that includes plenty of vegetables, fruits, low-fat dairy products, and lean protein. Do not eat a lot of foods that are high in solid fats, added sugars, or sodium. Maintain a healthy weight Body mass index (BMI) is a measurement that can be used to identify possible weight problems. It estimates body fat based on height and weight. Your health care provider can help determine your BMI and help you achieve or maintain a healthy weight. Get regular exercise Get regular exercise. This is one of the most important things you can do for your health. Most adults should: Exercise for at least 150 minutes each week. The exercise should increase your heart rate and make you sweat (moderate-intensity exercise). Do strengthening exercises at least twice a week. This is in addition to the moderate-intensity exercise. Spend less time sitting. Even light physical activity can be beneficial. Watch cholesterol and blood lipids Have your blood tested for lipids and cholesterol at 73 years of age, then have this test every 5 years. You may need to have your cholesterol levels checked more often if: Your lipid or cholesterol levels are high. You are older than 73 years of age. You are at high risk for heart disease. What should I know about cancer screening? Many types of cancers can be detected early and may often be prevented. Depending on your health history and family history, you may need to have cancer screening at various ages. This may include screening for: Colorectal cancer. Prostate cancer. Skin cancer. Lung  cancer. What should I know about heart disease, diabetes, and high blood pressure? Blood pressure and heart disease High blood pressure causes heart disease and increases the risk of stroke. This is more likely to develop in people who have high blood pressure readings or are overweight. Talk with your health care provider about your target blood pressure readings. Have your blood pressure checked: Every 3-5 years if you are 18-39 years of age. Every year if you are 40 years old or older. If you are between the ages of 65 and 75 and are a current or former smoker, ask your health care provider if you should have a one-time screening for abdominal aortic aneurysm (AAA). Diabetes Have regular diabetes screenings. This checks your fasting blood sugar level. Have the screening done: Once every three years after age 45 if you are at a normal weight and have a low risk for diabetes. More often and at a younger age if you are overweight or have a high risk for diabetes. What should I know about preventing infection? Hepatitis B If you have a higher risk for hepatitis B, you should be screened for this virus. Talk with your health care provider to find out if you are at risk for hepatitis B infection. Hepatitis C Blood testing is recommended for: Everyone born from 1945 through 1965. Anyone with known risk factors for hepatitis C. Sexually transmitted infections (STIs) You should be screened each year for STIs, including gonorrhea and chlamydia, if: You are sexually active and are younger than 73 years of age. You are older than 73 years of age and your   health care provider tells you that you are at risk for this type of infection. Your sexual activity has changed since you were last screened, and you are at increased risk for chlamydia or gonorrhea. Ask your health care provider if you are at risk. Ask your health care provider about whether you are at high risk for HIV. Your health care provider  may recommend a prescription medicine to help prevent HIV infection. If you choose to take medicine to prevent HIV, you should first get tested for HIV. You should then be tested every 3 months for as long as you are taking the medicine. Follow these instructions at home: Alcohol use Do not drink alcohol if your health care provider tells you not to drink. If you drink alcohol: Limit how much you have to 0-2 drinks a day. Know how much alcohol is in your drink. In the [place] one drink equals one 12 oz bottle of beer (355 mL), one 5 oz glass of wine (148 mL), or one 1 oz glass of hard liquor (44 mL). Lifestyle Do not use any products that contain nicotine or tobacco. These products include cigarettes, chewing tobacco, and vaping devices, such as e-cigarettes. If you need help quitting, ask your health care provider. Do not use street drugs. Do not share needles. Ask your health care provider for help if you need support or information about quitting drugs. General instructions Schedule regular health, dental, and eye exams. Stay current with your vaccines. Tell your health care provider if: You often feel depressed. You have ever been abused or do not feel safe at home. Summary Adopting a healthy lifestyle and getting preventive care are important in promoting health and wellness. Follow your health care provider's instructions about healthy diet, exercising, and getting tested or screened for diseases. Follow your health care provider's instructions on monitoring your cholesterol and blood pressure. This information is not intended to replace advice given to you by your health care provider. Make sure you discuss any questions you have with your health care provider. Document Revised: 07/28/2020 Document Reviewed: 07/28/2020 Elsevier Patient Education  2023 Elsevier Inc.  

## 2021-11-18 ENCOUNTER — Ambulatory Visit (INDEPENDENT_AMBULATORY_CARE_PROVIDER_SITE_OTHER): Payer: PPO

## 2021-11-18 ENCOUNTER — Ambulatory Visit: Payer: PPO

## 2021-11-18 DIAGNOSIS — Z0000 Encounter for general adult medical examination without abnormal findings: Secondary | ICD-10-CM | POA: Diagnosis not present

## 2021-11-18 NOTE — Patient Instructions (Signed)
Health Maintenance, Male Adopting a healthy lifestyle and getting preventive care are important in promoting health and wellness. Ask your health care provider about: The right schedule for you to have regular tests and exams. Things you can do on your own to prevent diseases and keep yourself healthy. What should I know about diet, weight, and exercise? Eat a healthy diet  Eat a diet that includes plenty of vegetables, fruits, low-fat dairy products, and lean protein. Do not eat a lot of foods that are high in solid fats, added sugars, or sodium. Maintain a healthy weight Body mass index (BMI) is a measurement that can be used to identify possible weight problems. It estimates body fat based on height and weight. Your health care provider can help determine your BMI and help you achieve or maintain a healthy weight. Get regular exercise Get regular exercise. This is one of the most important things you can do for your health. Most adults should: Exercise for at least 150 minutes each week. The exercise should increase your heart rate and make you sweat (moderate-intensity exercise). Do strengthening exercises at least twice a week. This is in addition to the moderate-intensity exercise. Spend less time sitting. Even light physical activity can be beneficial. Watch cholesterol and blood lipids Have your blood tested for lipids and cholesterol at 73 years of age, then have this test every 5 years. You may need to have your cholesterol levels checked more often if: Your lipid or cholesterol levels are high. You are older than 73 years of age. You are at high risk for heart disease. What should I know about cancer screening? Many types of cancers can be detected early and may often be prevented. Depending on your health history and family history, you may need to have cancer screening at various ages. This may include screening for: Colorectal cancer. Prostate cancer. Skin cancer. Lung  cancer. What should I know about heart disease, diabetes, and high blood pressure? Blood pressure and heart disease High blood pressure causes heart disease and increases the risk of stroke. This is more likely to develop in people who have high blood pressure readings or are overweight. Talk with your health care provider about your target blood pressure readings. Have your blood pressure checked: Every 3-5 years if you are 18-39 years of age. Every year if you are 40 years old or older. If you are between the ages of 65 and 75 and are a current or former smoker, ask your health care provider if you should have a one-time screening for abdominal aortic aneurysm (AAA). Diabetes Have regular diabetes screenings. This checks your fasting blood sugar level. Have the screening done: Once every three years after age 45 if you are at a normal weight and have a low risk for diabetes. More often and at a younger age if you are overweight or have a high risk for diabetes. What should I know about preventing infection? Hepatitis B If you have a higher risk for hepatitis B, you should be screened for this virus. Talk with your health care provider to find out if you are at risk for hepatitis B infection. Hepatitis C Blood testing is recommended for: Everyone born from 1945 through 1965. Anyone with known risk factors for hepatitis C. Sexually transmitted infections (STIs) You should be screened each year for STIs, including gonorrhea and chlamydia, if: You are sexually active and are younger than 73 years of age. You are older than 73 years of age and your   health care provider tells you that you are at risk for this type of infection. Your sexual activity has changed since you were last screened, and you are at increased risk for chlamydia or gonorrhea. Ask your health care provider if you are at risk. Ask your health care provider about whether you are at high risk for HIV. Your health care provider  may recommend a prescription medicine to help prevent HIV infection. If you choose to take medicine to prevent HIV, you should first get tested for HIV. You should then be tested every 3 months for as long as you are taking the medicine. Follow these instructions at home: Alcohol use Do not drink alcohol if your health care provider tells you not to drink. If you drink alcohol: Limit how much you have to 0-2 drinks a day. Know how much alcohol is in your drink. In the [place] one drink equals one 12 oz bottle of beer (355 mL), one 5 oz glass of wine (148 mL), or one 1 oz glass of hard liquor (44 mL). Lifestyle Do not use any products that contain nicotine or tobacco. These products include cigarettes, chewing tobacco, and vaping devices, such as e-cigarettes. If you need help quitting, ask your health care provider. Do not use street drugs. Do not share needles. Ask your health care provider for help if you need support or information about quitting drugs. General instructions Schedule regular health, dental, and eye exams. Stay current with your vaccines. Tell your health care provider if: You often feel depressed. You have ever been abused or do not feel safe at home. Summary Adopting a healthy lifestyle and getting preventive care are important in promoting health and wellness. Follow your health care provider's instructions about healthy diet, exercising, and getting tested or screened for diseases. Follow your health care provider's instructions on monitoring your cholesterol and blood pressure. This information is not intended to replace advice given to you by your health care provider. Make sure you discuss any questions you have with your health care provider. Document Revised: 07/28/2020 Document Reviewed: 07/28/2020 Elsevier Patient Education  2023 Elsevier Inc.  

## 2021-11-18 NOTE — Progress Notes (Signed)
Subjective:   Lawrence Hunter is a 71 y.o. male who presents for Medicare Annual/Subsequent preventive examination.  I connected with  Lawrence Hunter on 11/18/21 by an audio only telemedicine application and verified that I am speaking with the correct person using two identifiers.   I discussed the limitations, risks, security and privacy concerns of performing an evaluation and management service by telephone and the availability of in person appointments. I also discussed with the patient that there may be a patient responsible charge related to this service. The patient expressed understanding and verbally consented to this telephonic visit.  Location of Patient: home  Location of Provider:office  List any persons and their role that are participating in the visit with the patient.   Lawrence Hunter, CMA  Review of Systems    Defer to PCP  Cardiac Risk Factors include: advanced age (>44mn, >>35women);male gender     Objective:    There were no vitals filed for this visit. There is no height or weight on file to calculate BMI.     11/18/2021    9:27 AM 11/12/2020   10:48 AM 09/03/2018    7:59 PM 05/19/2016   10:32 AM 05/05/2016    1:59 PM  Advanced Directives  Does Patient Have a Medical Advance Directive? Yes Yes Yes Yes Yes  Type of AParamedicof [place]Living will Healthcare Power of [place] will [place] will  Copy of [place] Chart? Yes - validated most recent copy scanned in chart (See row information) Yes - validated most recent copy scanned in chart (See row information)       Current Medications (verified) Outpatient Encounter Medications as of 11/18/2021  Medication Sig   aspirin 81 MG tablet Take 81 mg by mouth daily.   atorvastatin (LIPITOR) 80 MG tablet Take 1 tablet (80 mg total) by mouth daily.   bisoprolol (ZEBETA) 5 MG  tablet Take 1 tablet (5 mg total) by mouth daily.   Cyanocobalamin (VITAMIN B-12 PO) Take 1 tablet by mouth daily.   ezetimibe (ZETIA) 10 MG tablet Take 1 tablet (10 mg total) by mouth daily.   fluticasone (FLONASE) 50 MCG/ACT nasal spray Place 2 sprays into both nostrils daily.   levothyroxine (SYNTHROID) 112 MCG tablet Take 1 tablet (112 mcg total) by mouth daily.   metFORMIN (GLUCOPHAGE) 500 MG tablet Take 1 tablet (500 mg total) by mouth daily with supper.   montelukast (SINGULAIR) 10 MG tablet TAKE 1 TABLET BY MOUTH EVERYDAY AT BEDTIME   Facility-Administered Encounter Medications as of 11/18/2021  Medication   0.9 %  sodium chloride infusion    Allergies (verified) Plavix [clopidogrel bisulfate]   History: Past Medical History:  Diagnosis Date   Allergy    Aortic root enlargement ([place]    CTA chest/aorta 04/2017:  Mild enlargement of aortic root, measuring 3.9 x 3.8 x 3.6 cm.   Arthralgia of both knees 09/2015   Plain films with minimal DJD changes medial compartment, o/w nl.   Arthritis    Claudication ([place]    Normal ABI's in 2006   Coronary artery disease    s/p stents   Ectatic abdominal aorta ([place] 08/2018   Ectatic abd aorta (3.4 cm max diameter) but no aneurism.  Rpt u/s 3 yrs.   Fatigue due to treatment    better when lopressor dose was decreased to 12.'5mg'$  bid 04/2017.   Hearing  loss    Bilat sensorineural; [facility] ENT eval 07/2019; pt candidate for cochlear implants   History of adenomatous polyp of colon 05/19/2016   Recall 04/2021 (Dr. Havery Hunter)   Hyperlipidemia    Hypothyroidism    Nephrolithiasis    passed one stone approx 2008; no prob since (saw urologist briefly). UC visit 06/2020 R flank pain, stone suspected.   Normal nuclear stress test 2012   Prediabetes 02/2016   HbA1c 6.1%.  2019 A1c 6.3%. 2022 A1c 6.3%   Prostatitis    When pt in 4s and 40s; saw Dr. Gaynelle Hunter and eventually got a TURP per pt's description.  No probs since then.   Recurrent  sinusitis    Sinus plain films 08/2014: bilat maxillary sinusitis (chronic)   S/P coronary artery stent placement 2005   RCA and LCX.  As of 02/2018 cardiology f/u, plan is to get ETT (pt doing fine, but was not having chest pain prior to last stent placement).   Tobacco dependence    current as of 01/2019   Past Surgical History:  Procedure Laterality Date   Aortic ultrasound  09/07/2018   Ectatic abd aorta (3.4 cm max diameter) but no aneurism.  Rpt u/s 3 yrs.   APPENDECTOMY     CARDIAC CATHETERIZATION  04/16/2003   NORMAL. EF 60%. TOTALLY OCCLUDED LEFT CIRCUMFLEX WITH SEVERE DISEASE, AND ULCERATED PLAQUE IN THE PROXIMAL RIGHT CORONARY ARTERY AND MILD ATHERSCLEROSIS IN THE LEFT ANTERIOR DESCENDING   CARDIOVASCULAR STRESS TEST  2009;2012; 10/2014; 03/2018   2012 normal nuclear stress test.  2016 ETT no ischemia.  ETT normal 03/2018.   COLONOSCOPY W/ POLYPECTOMY  05/19/2016   Multiple polyps--tubular adenoma.  Recall 5 yrs (04/2021).   CORONARY STENT PLACEMENT  04/19/2003   STENT PLACEMENT IN THE LEFT CIRCUMFLEX CORONARY AND RIGHT CORONARY ARTERY. MILD RESIDUAL STENOSIS IN THE MID PORTION OF THE LEFT ANTERIOR DESCENDING   TRANSTHORACIC ECHOCARDIOGRAM  05/03/2017   EF 60-65%, normal valves, mildly enlarged aortic root.   Family History  Problem Relation Age of Onset   Hypertension Mother    Stroke Mother    Hypertension Father    Stroke Father    Prostate cancer Father    Heart attack Brother        STENT   Colon cancer Neg Hx    Social History   Socioeconomic History   Marital status: Married    Spouse name: Not on file   Number of children: 2   Years of education: Not on file   Highest education level: Not on file  Occupational History   Occupation: Sales Rep  Tobacco Use   Smoking status: Every Day    Packs/day: 1.00    Types: Cigarettes   Smokeless tobacco: Never  Vaping Use   Vaping Use: Never used  Substance and Sexual Activity   Alcohol use: No   Drug use: No    Sexual activity: Yes  Other Topics Concern   Not on file  Social History Narrative   Married, 1 son in [place] and one in [place].   Lives in [place].   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   Tob: 40 pack-yr hx, current as of 08/2020.   Alcohol: social/rare.   Social Determinants of Health   Financial Resource Strain: Low Risk  (11/18/2021)   Overall Financial Resource Strain (CARDIA)    Difficulty of Paying Living Expenses: Not hard at all  Food Insecurity: No  Food Insecurity (11/18/2021)   Hunger Vital Sign    Worried About Running Out of Food in the Last Year: Never true    Ran Out of Food in the Last Year: Never true  Transportation Needs: No Transportation Needs (11/18/2021)   PRAPARE - Hydrologist (Medical): No    Lack of Transportation (Non-Medical): No  Physical Activity: Insufficiently Active (11/18/2021)   Exercise Vital Sign    Days of Exercise per Week: 4 days    Minutes of Exercise per Session: 30 min  Stress: No Stress Concern Present (11/18/2021)   [place]    Feeling of Stress : Not at all  Social Connections: Moderately Integrated (11/18/2021)   Social Connection and Isolation Panel [NHANES]    Frequency of Communication with Friends and Family: More than three times a week    Frequency of Social Gatherings with Friends and Family: More than three times a week    Attends Religious Services: 1 to 4 times per year    Active Member of [employer] or Organizations: No    Attends Music therapist: Never    Marital Status: Married    Tobacco Counseling Ready to quit: Not Answered Counseling given: Not Answered   Clinical Intake:  Pre-visit preparation completed: Yes  Pain : No/denies pain     Diabetes: No  How often do you need to have someone help you when you read instructions, pamphlets,  or other written materials from your doctor or pharmacy?: 1 - Never  Diabetic?no  Interpreter Needed?: No      Activities of Daily Living    11/18/2021    9:26 AM  In your present state of health, do you have any difficulty performing the following activities:  Hearing? 1  Vision? 0  Difficulty concentrating or making decisions? 0  Walking or climbing stairs? 0  Dressing or bathing? 0  Doing errands, shopping? 0  Preparing Food and eating ? N  Using the Toilet? N  In the past six months, have you accidently leaked urine? N  Do you have problems with loss of bowel control? N  Managing your Medications? N  Managing your Finances? N  Housekeeping or managing your Housekeeping? N    Patient Care Team: Tammi Sou, MD as PCP - General (Family Medicine) Burnell Blanks, MD as PCP - Cardiology (Cardiology) Burnell Blanks, MD as Consulting Physician (Cardiology) Armbruster, Carlota Raspberry, MD as Consulting Physician (Gastroenterology) Burnell Blanks, MD as Consulting Physician (Cardiology)  Indicate any recent Medical Services you may have received from other than [facility] providers in the past year (date may be approximate).     Assessment:   This is a routine wellness examination for Detron.  Hearing/Vision screen No results found.  Dietary issues and exercise activities discussed: Current Exercise Habits: Home exercise routine, Type of exercise: walking, Time (Minutes): 30, Frequency (Times/Week): 7, Weekly Exercise (Minutes/Week): 210   Goals Addressed   None   Depression Screen    11/18/2021    9:17 AM 10/28/2021   10:12 AM 04/01/2021    1:04 PM 11/12/2020   11:01 AM 10/15/2019   11:26 AM 07/26/2018   10:25 AM 10/04/2017   12:08 PM  PHQ 2/9 Scores  PHQ - 2 Score 0 0 0 0 0 0 0  PHQ- 9 Score       0    Fall Risk    11/18/2021  9:26 AM 10/28/2021   10:12 AM 04/01/2021    1:04 PM 11/12/2020   10:54 AM 10/16/2019   12:46 PM  Fall Risk   Falls  in the past year? 0 0 0 0 0  Comment     Emmi Telephone Survey: data to providers prior to load  Number falls in past yr: 0 0 0 0   Injury with Fall? 0 0 0 0   Risk for fall due to : No Fall Risks No Fall Risks     Follow up Falls evaluation completed Falls evaluation completed Falls evaluation completed Falls evaluation completed;Falls prevention discussed     FALL RISK PREVENTION PERTAINING TO THE HOME:  Any stairs in or around the home? Yes  If so, are there any without handrails? Yes  Home free of loose throw rugs in walkways, pet beds, electrical cords, etc? Yes  Adequate lighting in your home to reduce risk of falls? Yes   ASSISTIVE DEVICES UTILIZED TO PREVENT FALLS:  Life alert? No  Use of a cane, walker or w/c? No  Grab bars in the bathroom? Yes  Shower chair or bench in shower? Yes  Elevated toilet seat or a handicapped toilet? Yes   TIMED UP AND GO:  Was the test performed? No .  Length of time to ambulate 10 feet: n/a sec.     Cognitive Function:        11/18/2021    9:28 AM  6CIT Screen  What Year? 0 points  What month? 0 points  What time? 0 points  Count back from 20 0 points  Months in reverse 0 points  Repeat phrase 0 points  Total Score 0 points    Immunizations Immunization History  Administered Date(s) Administered   Influenza, High Dose Seasonal PF 02/20/2015, 12/31/2015, 12/23/2017, 12/31/2019, 12/23/2020   Influenza, Quadrivalent, Recombinant, Inj, Pf 01/03/2019   Influenza-Unspecified 02/11/2014, 12/23/2017   Moderna Covid-19 Vaccine Bivalent Booster 55yr & up 11/19/2020   Moderna Sars-Covid-2 Vaccination 04/26/2019, 05/21/2019   Pneumococcal Conjugate-13 08/21/2014   Pneumococcal Polysaccharide-23 03/18/2016   Tdap 08/21/2014    TDAP status: Up to date  Flu Vaccine status: Due, Education has been provided regarding the importance of this vaccine. Advised may receive this vaccine at local pharmacy or Health Dept. Aware to provide a  copy of the vaccination record if obtained from local pharmacy or Health Dept. Verbalized acceptance and understanding.  Pneumococcal vaccine status: Up to date  Covid-19 vaccine status: Completed vaccines  Qualifies for Shingles Vaccine? Yes   Zostavax completed No   Shingrix Completed?: No.    Education has been provided regarding the importance of this vaccine. Patient has been advised to call insurance company to determine out of pocket expense if they have not yet received this vaccine. Advised may also receive vaccine at local pharmacy or Health Dept. Verbalized acceptance and understanding.  Screening Tests Health Maintenance  Topic Date Due   Zoster Vaccines- Shingrix (1 of 2) Never done   COVID-19 Vaccine (4 - Moderna risk series) 01/14/2021   INFLUENZA VACCINE  10/20/2021   TETANUS/TDAP  08/20/2024   COLONOSCOPY (Pts 45-414yrInsurance coverage will need to be confirmed)  05/19/2026   Pneumonia Vaccine 6533Years old  Completed   Hepatitis C Screening  Completed   HPV VACCINES  Aged Out    Health Maintenance  Health Maintenance Due  Topic Date Due   Zoster Vaccines- Shingrix (1 of 2) Never done   COVID-19 Vaccine (4 - Moderna  risk series) 01/14/2021   INFLUENZA VACCINE  10/20/2021    Colorectal cancer screening: Type of screening: Colonoscopy. Completed 05/19/2016. Repeat every 10 years  Lung Cancer Screening: (Low Dose CT Chest recommended if Age 8-80 years, 30 pack-year currently smoking OR have quit w/in 15years.) does qualify.   Lung Cancer Screening Referral: n/a  Additional Screening:  Hepatitis C Screening: does qualify; Completed 03/18/2016  Vision Screening: Recommended annual ophthalmology exams for early detection of glaucoma and other disorders of the eye. Is the patient up to date with their annual eye exam?   scheduled Who is the provider or what is the name of the office in which the patient attends annual eye exams? [facility] If pt is not  established with a provider, would they like to be referred to a provider to establish care? No .   Dental Screening: Recommended annual dental exams for proper oral hygiene  Community Resource Referral / Chronic Care Management: CRR required this visit?  No   CCM required this visit?  No      Plan:     I have personally reviewed and noted the following in the patient's chart:   Medical and social history Use of alcohol, tobacco or illicit drugs  Current medications and supplements including opioid prescriptions. Patient is not currently taking opioid prescriptions. Functional ability and status Nutritional status Physical activity Advanced directives List of other physicians Hospitalizations, surgeries, and ER visits in previous 12 months Vitals Screenings to include cognitive, depression, and falls Referrals and appointments  In addition, I have reviewed and discussed with patient certain preventive protocols, quality metrics, and best practice recommendations. A written personalized care plan for preventive services as well as general preventive health recommendations were provided to patient.     Octaviano Glow, CMA   11/18/2021   Nurse Notes: Non-Face to Face or Face to Face 10 minute visit Encounter   Mr. Moshe Cipro , Thank you for taking time to come for your Medicare Wellness Visit. I appreciate your ongoing commitment to your health goals. Please review the following plan we discussed and let me know if I can assist you in the future.   These are the goals we discussed:  Goals      Increase physical activity     Get A1C down        This is a list of the screening recommended for you and due dates:  Health Maintenance  Topic Date Due   Zoster (Shingles) Vaccine (1 of 2) Never done   COVID-19 Vaccine (4 - Moderna risk series) 01/14/2021   Flu Shot  10/20/2021   Tetanus Vaccine  08/20/2024   Colon Cancer Screening  05/19/2026   Pneumonia Vaccine  Completed    Hepatitis C Screening: USPSTF Recommendation to screen - Ages 18-79 yo.  Completed   HPV Vaccine  Aged Out

## 2022-03-20 ENCOUNTER — Other Ambulatory Visit: Payer: Self-pay | Admitting: Family Medicine

## 2022-03-25 ENCOUNTER — Encounter: Payer: Self-pay | Admitting: Family Medicine

## 2022-03-25 ENCOUNTER — Ambulatory Visit (INDEPENDENT_AMBULATORY_CARE_PROVIDER_SITE_OTHER): Payer: PPO | Admitting: Family Medicine

## 2022-03-25 VITALS — BP 129/75 | HR 64 | Temp 97.6°F | Ht 69.09 in | Wt 192.8 lb

## 2022-03-25 DIAGNOSIS — M545 Low back pain, unspecified: Secondary | ICD-10-CM

## 2022-03-25 DIAGNOSIS — R109 Unspecified abdominal pain: Secondary | ICD-10-CM

## 2022-03-25 LAB — POCT URINALYSIS DIPSTICK
Bilirubin, UA: NEGATIVE
Blood, UA: NEGATIVE
Glucose, UA: NEGATIVE
Ketones, UA: NEGATIVE
Leukocytes, UA: NEGATIVE
Nitrite, UA: NEGATIVE
Protein, UA: NEGATIVE
Spec Grav, UA: 1.015 (ref 1.010–1.025)
Urobilinogen, UA: 0.2 E.U./dL
pH, UA: 6 (ref 5.0–8.0)

## 2022-03-25 MED ORDER — BISOPROLOL FUMARATE 5 MG PO TABS: 5.0000 mg | ORAL_TABLET | Freq: Every day | ORAL | 3 refills | Status: DC

## 2022-03-25 NOTE — Progress Notes (Signed)
OFFICE VISIT  03/25/2022  CC:  Chief Complaint  Patient presents with   Back Pain    Lower left back pain since late Sunday, some improvement but still persistent. Took Tylenol once but not very helpful    Patient is a 72 y.o. male who presents for left low back pain.  HPI: About 4 days ago he started to ache in the left lower back. For 2 days prior to that he had been in the yard pulling things out of the ground--lots more activity than usual. The pain has been pretty persistent and yesterday briefly went down the back of the left leg some--about mid thigh level.  No radiation across the side or abdomen or groin.  No burning pain, no paresthesias. He has a remote history of kidney stone and says it felt like he may be having 1. Took Tylenol a couple of days ago and it did help a little bit. No blood in urine, no dysuria, no fever, no nausea.   Past Medical History:  Diagnosis Date   Allergy    Aortic root enlargement ([place])    CTA chest/aorta 04/2017:  Mild enlargement of aortic root, measuring 3.9 x 3.8 x 3.6 cm.   Arthralgia of both knees 09/2015   Plain films with minimal DJD changes medial compartment, o/w nl.   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  Rpt u/s 3 yrs.   Fatigue due to treatment    better when lopressor dose was decreased to 12.'5mg'$  bid 04/2017.   Hearing loss    Bilat sensorineural; [facility] ENT eval 07/2019; pt candidate for cochlear implants   History of adenomatous polyp of colon 05/19/2016   Recall 04/2021 (Dr. Havery Moros)   Hyperlipidemia    Hypothyroidism    Nephrolithiasis    passed one stone approx 2008; no prob since (saw urologist briefly). UC visit 06/2020 R flank pain, stone suspected.   Normal nuclear stress test 2012   Prediabetes 02/2016   HbA1c 6.1%.  2019 A1c 6.3%. 2022 A1c 6.3%    Prostatitis    When pt in 53s and 40s; saw Dr. Gaynelle Arabian and eventually got a TURP per pt's description.  No probs since then.   Recurrent sinusitis    Sinus plain films 08/2014: bilat maxillary sinusitis (chronic)   S/P coronary artery stent placement 2005   RCA and LCX.  As of 02/2018 cardiology f/u, plan is to get ETT (pt doing fine, but was not having chest pain prior to last stent placement).   Tobacco dependence    current as of 01/2019    Past Surgical History:  Procedure Laterality Date   Aortic ultrasound  09/07/2018   Ectatic abd aorta (3.4 cm max diameter) but no aneurism.  Rpt u/s 3 yrs.   APPENDECTOMY     CARDIAC CATHETERIZATION  04/16/2003   NORMAL. EF 60%. TOTALLY OCCLUDED LEFT CIRCUMFLEX WITH SEVERE DISEASE, AND ULCERATED PLAQUE IN THE PROXIMAL RIGHT CORONARY ARTERY AND MILD ATHERSCLEROSIS IN THE LEFT ANTERIOR DESCENDING   CARDIOVASCULAR STRESS TEST  2009;2012; 10/2014; 03/2018   2012 normal nuclear stress test.  2016 ETT no ischemia.  ETT normal 03/2018.   COLONOSCOPY W/ POLYPECTOMY  05/19/2016   Multiple polyps--tubular adenoma.  Recall 5 yrs (04/2021).   CORONARY STENT PLACEMENT  04/19/2003  STENT PLACEMENT IN THE LEFT CIRCUMFLEX CORONARY AND RIGHT CORONARY ARTERY. MILD RESIDUAL STENOSIS IN THE MID PORTION OF THE LEFT ANTERIOR DESCENDING   TRANSTHORACIC ECHOCARDIOGRAM  05/03/2017   EF 60-65%, normal valves, mildly enlarged aortic root.    Outpatient Medications Prior to Visit  Medication Sig Dispense Refill   aspirin 81 MG tablet Take 81 mg by mouth daily.     atorvastatin (LIPITOR) 80 MG tablet Take 1 tablet (80 mg total) by mouth daily. 90 tablet 3   bisoprolol (ZEBETA) 5 MG tablet Take 1 tablet (5 mg total) by mouth daily. 90 tablet 3   Cyanocobalamin (VITAMIN B-12 PO) Take 1 tablet by mouth daily.     ezetimibe (ZETIA) 10 MG tablet Take 1 tablet (10 mg total) by mouth daily. 90 tablet 3   fluticasone (FLONASE) 50 MCG/ACT nasal spray Place 2 sprays into both  nostrils daily. 48 g 3   levothyroxine (SYNTHROID) 112 MCG tablet Take 1 tablet (112 mcg total) by mouth daily. 90 tablet 1   metFORMIN (GLUCOPHAGE) 500 MG tablet Take 1 tablet (500 mg total) by mouth daily with supper. 90 tablet 3   montelukast (SINGULAIR) 10 MG tablet TAKE 1 TABLET BY MOUTH EVERYDAY AT BEDTIME 90 tablet 3   0.9 %  sodium chloride infusion      No facility-administered medications prior to visit.    Allergies  Allergen Reactions   Plavix [Clopidogrel Bisulfate] Rash    Review of Systems  As per HPI  PE:    03/25/2022    2:31 PM 10/28/2021    9:58 AM 08/19/2021   10:38 AM  Vitals with BMI  Height 5' 9.09" 5' 9.09" 5' 9.09"  Weight 192 lbs 13 oz 189 lbs 3 oz 190 lbs 10 oz  BMI 28.39 96.28 36.62  Systolic 947 97 654  Diastolic 75 54 70  Pulse 64 60 58     Physical Exam  Gen: Alert, well appearing.  Patient is oriented to person, place, time, and situation. AFFECT: pleasant, lucid thought and speech. Back: No tenderness to palpation over the lumbosacral spine or left hip. Spine range of motion is intact and his pain is reproduced at mild intensity when he extends the L-spine and when he laterally bends to the left. Sitting straight leg test negative. Lower extremity strength 5 out of 5 proximally distally.  No rash. No tenderness over his side or abdomen.   LABS:  Last CBC Lab Results  Component Value Date   WBC 8.4 10/28/2021   HGB 16.0 10/28/2021   HCT 47.4 10/28/2021   MCV 86.9 10/28/2021   MCH 29.2 06/06/2019   RDW 14.6 10/28/2021   PLT 167.0 65/05/5463   Last metabolic panel Lab Results  Component Value Date   GLUCOSE 100 (H) 10/28/2021   NA 141 10/28/2021   K 4.1 10/28/2021   CL 106 10/28/2021   CO2 28 10/28/2021   BUN 14 10/28/2021   CREATININE 0.94 10/28/2021   GFRNONAA 81 05/05/2020   CALCIUM 9.1 10/28/2021   PROT 6.4 08/21/2021   ALBUMIN 4.3 08/21/2021   BILITOT 0.8 08/21/2021   ALKPHOS 110 08/21/2021   AST 18 08/21/2021    ALT 21 08/21/2021   Last hemoglobin A1c Lab Results  Component Value Date   HGBA1C 5.7 (A) 10/28/2021   HGBA1C 5.7 10/28/2021   HGBA1C 5.7 10/28/2021   HGBA1C 5.7 10/28/2021   IMPRESSION AND PLAN:  Musculoskeletal low back pain, left side. UA normal.  Reassured patient that I  do not think he has a kidney stone. Encouraged gentle range of motion/stretching and Tylenol every 6 hours as needed.  An After Visit Summary was printed and given to the patient.  FOLLOW UP: No follow-ups on file.  Signed:  Crissie Sickles, MD           03/25/2022

## 2022-04-11 ENCOUNTER — Other Ambulatory Visit: Payer: Self-pay | Admitting: Family Medicine

## 2022-04-22 ENCOUNTER — Other Ambulatory Visit: Payer: Self-pay | Admitting: Family Medicine

## 2022-04-26 ENCOUNTER — Ambulatory Visit (INDEPENDENT_AMBULATORY_CARE_PROVIDER_SITE_OTHER): Payer: PPO | Admitting: Family Medicine

## 2022-04-26 ENCOUNTER — Encounter: Payer: Self-pay | Admitting: Family Medicine

## 2022-04-26 VITALS — BP 99/61 | HR 67 | Temp 98.4°F | Ht 69.0 in | Wt 188.0 lb

## 2022-04-26 DIAGNOSIS — E7800 Pure hypercholesterolemia, unspecified: Secondary | ICD-10-CM

## 2022-04-26 DIAGNOSIS — R7303 Prediabetes: Secondary | ICD-10-CM

## 2022-04-26 DIAGNOSIS — Z8679 Personal history of other diseases of the circulatory system: Secondary | ICD-10-CM

## 2022-04-26 DIAGNOSIS — E039 Hypothyroidism, unspecified: Secondary | ICD-10-CM

## 2022-04-26 LAB — LIPID PANEL
Cholesterol: 110 mg/dL (ref 0–200)
HDL: 39.8 mg/dL (ref >39.00)
LDL Cholesterol: 40 mg/dL (ref 0–99)
NonHDL: 70
Total CHOL/HDL Ratio: 3
Triglycerides: 151 mg/dL — ABNORMAL HIGH (ref 0.0–149.0)
VLDL: 30.2 mg/dL (ref 0.0–40.0)

## 2022-04-26 LAB — COMPREHENSIVE METABOLIC PANEL
ALT: 23 U/L (ref 0–53)
AST: 17 U/L (ref 0–37)
Albumin: 4.1 g/dL (ref 3.5–5.2)
Alkaline Phosphatase: 113 U/L (ref 39–117)
BUN: 15 mg/dL (ref 6–23)
CO2: 24 mEq/L (ref 19–32)
Calcium: 9.1 mg/dL (ref 8.4–10.5)
Chloride: 107 mEq/L (ref 96–112)
Creatinine, Ser: 0.97 mg/dL (ref 0.40–1.50)
GFR: 77.64 mL/min (ref >60.00)
Glucose, Bld: 101 mg/dL — ABNORMAL HIGH (ref 70–99)
Potassium: 4.1 mEq/L (ref 3.5–5.1)
Sodium: 141 mEq/L (ref 135–145)
Total Bilirubin: 0.7 mg/dL (ref 0.2–1.2)
Total Protein: 6.4 g/dL (ref 6.0–8.3)

## 2022-04-26 LAB — POCT GLYCOSYLATED HEMOGLOBIN (HGB A1C)
HbA1c POC (<> result, manual entry): 5.8 % (ref 4.0–5.6)
HbA1c, POC (controlled diabetic range): 5.8 % (ref 0.0–7.0)
HbA1c, POC (prediabetic range): 5.8 % (ref 5.7–6.4)
Hemoglobin A1C: 5.8 % — AB (ref 4.0–5.6)

## 2022-04-26 LAB — TSH: TSH: 0.45 u[IU]/mL (ref 0.35–5.50)

## 2022-04-26 MED ORDER — LEVOTHYROXINE SODIUM 112 MCG PO TABS: 112.0000 ug | ORAL_TABLET | Freq: Every day | ORAL | 1 refills | Status: DC

## 2022-04-26 MED ORDER — EZETIMIBE 10 MG PO TABS: 10.0000 mg | ORAL_TABLET | Freq: Every day | ORAL | 3 refills | Status: DC

## 2022-04-26 MED ORDER — METFORMIN HCL 500 MG PO TABS: 500.0000 mg | ORAL_TABLET | Freq: Every day | ORAL | 3 refills | Status: DC

## 2022-04-26 MED ORDER — MONTELUKAST SODIUM 10 MG PO TABS: ORAL_TABLET | ORAL | 3 refills | Status: DC

## 2022-04-26 MED ORDER — BISOPROLOL FUMARATE 5 MG PO TABS: 5.0000 mg | ORAL_TABLET | Freq: Every day | ORAL | 3 refills | Status: DC

## 2022-04-26 NOTE — Progress Notes (Signed)
OFFICE VISIT  04/26/2022  CC:  Chief Complaint  Patient presents with   Hypothyroidism    F/u; pt is not fasting    Patient is a 72 y.o. male who presents for 78-monthfollow-up hypothyroidism, prediabetes, and hyperlipidemia. A/P as of last visit: "1 prediabetes. Hemoglobin A1c today is 5.7%.  Continue metformin 500 mg a day and healthy diet.   #2 tobacco dependence. Encourage cessation.  3.  Hyperlipidemia, LDL goal less than 70. LDL was 52 approximately 2 months ago. Continue atorvastatin 80 mg a day and Zetia 10 mg a day.   #4 hypothyroidism Hypoth: Takes T4 on empty stomach w/out any other meds. TSH was 1.07 in January this year. We have been doing annual checks and they have all been stable. Will do next TSH check in 6 months.   #5 Health maintenance exam: Reviewed age and gender appropriate health maintenance issues (prudent diet, regular exercise, health risks of tobacco and excessive alcohol, use of seatbelts, fire alarms in home, use of sunscreen).  Also reviewed age and gender appropriate health screening as well as vaccine recommendations. Vaccines: shingrix->rx sent to pharmacy. otherwise up-to-date Labs: CBC, BMet, point-of-care hemoglobin A1c Prostate ca screening: Per shared decision making process back in 2020 he has decided that he wants no further prostate cancer screening. Colon ca screening: recall as of 04/2021--> patient considering waiting till the beginning of next year to call and arrange this. Lung cancer screening: Patient declined at this time"  INTERIM HX: Lawrence Hunter feeling well.  Home blood pressures 120s over 70s.  He did have 1 loose bowel movement earlier this morning but ate a doughnut and drink coffee and feels fine.  ROS as above, plus--> no fevers, no CP, no SOB, no wheezing, no cough, no dizziness, no HAs, no rashes, no melena/hematochezia.  No polyuria or polydipsia.  No myalgias or arthralgias.  No focal weakness, paresthesias, or  tremors.  No acute vision or hearing abnormalities.  No dysuria or unusual/new urinary urgency or frequency.  No recent changes in lower legs. No n/v or abd pain.  No palpitations.     Past Medical History:  Diagnosis Date   Allergy    [place]    CTA chest/aorta 04/2017:  Mild enlargement of aortic root, measuring 3.9 x 3.8 x 3.6 cm.   Arthralgia of both knees 09/2015   Plain films with minimal DJD changes medial compartment, o/w nl.   Arthritis    Claudication ([place]    Normal ABI's in 2006   Coronary artery disease    s/p stents   [place] 08[place]   Ectatic abd aorta (3.4 cm max diameter) but no aneurism.  Rpt u/s 3 yrs.   Fatigue due to treatment    better when lopressor dose was decreased to 12.'5mg'$  bid 04/2017.   Hearing loss    [facility]   History of adenomatous polyp of colon 05/19/2016   Recall 04/2021 (Dr. AHavery Moros   Hyperlipidemia    Hypothyroidism    Nephrolithiasis    passed one stone approx 2008; no prob since (saw urologist briefly). UC visit 06/2020 R flank pain, stone suspected.   Normal nuclear stress test 2012   Prediabetes 02/2016   HbA1c 6.1%.  2019 A1c 6.3%. 2022 A1c 6.3%   Prostatitis    When pt in 323sand 40s; saw Dr. TGaynelle Arabianand eventually got a TURP per pt's description.  No probs  since then.   Recurrent sinusitis    Sinus plain films 08/2014: bilat maxillary sinusitis (chronic)   S/P coronary artery stent placement 2005   RCA and LCX.  As of 02/2018 cardiology f/u, plan is to get ETT (pt doing fine, but was not having chest pain prior to last stent placement).   Tobacco dependence    current as of 01/2019    Past Surgical History:  Procedure Laterality Date   Aortic ultrasound  09/07/2018   Ectatic abd aorta (3.4 cm max diameter) but no aneurism.  Rpt u/s 3 yrs.   APPENDECTOMY     CARDIAC CATHETERIZATION  04/16/2003   NORMAL. EF 60%.  TOTALLY OCCLUDED LEFT CIRCUMFLEX WITH SEVERE DISEASE, AND ULCERATED PLAQUE IN THE PROXIMAL RIGHT CORONARY ARTERY AND MILD ATHERSCLEROSIS IN THE LEFT ANTERIOR DESCENDING   CARDIOVASCULAR STRESS TEST  2009;2012; 10/2014; 03/2018   2012 normal nuclear stress test.  2016 ETT no ischemia.  ETT normal 03/2018.   COLONOSCOPY W/ POLYPECTOMY  05/19/2016   Multiple polyps--tubular adenoma.  Recall 5 yrs (04/2021).   CORONARY STENT PLACEMENT  04/19/2003   STENT PLACEMENT IN THE LEFT CIRCUMFLEX CORONARY AND RIGHT CORONARY ARTERY. MILD RESIDUAL STENOSIS IN THE MID PORTION OF THE LEFT ANTERIOR DESCENDING   TRANSTHORACIC ECHOCARDIOGRAM  05/03/2017   EF 60-65%, normal valves, mildly enlarged aortic root.    Outpatient Medications Prior to Visit  Medication Sig Dispense Refill   aspirin 81 MG tablet Take 81 mg by mouth daily.     atorvastatin (LIPITOR) 80 MG tablet Take 1 tablet (80 mg total) by mouth daily. 90 tablet 3   Cyanocobalamin (VITAMIN B-12 PO) Take 1 tablet by mouth daily.     fluticasone (FLONASE) 50 MCG/ACT nasal spray Place 2 sprays into both nostrils daily. 48 g 3   bisoprolol (ZEBETA) 5 MG tablet Take 1 tablet (5 mg total) by mouth daily. 90 tablet 3   ezetimibe (ZETIA) 10 MG tablet TAKE 1 TABLET BY MOUTH EVERY DAY 30 tablet 0   levothyroxine (SYNTHROID) 112 MCG tablet Take 1 tablet (112 mcg total) by mouth daily. 90 tablet 1   metFORMIN (GLUCOPHAGE) 500 MG tablet Take 1 tablet (500 mg total) by mouth daily with supper. 90 tablet 3   montelukast (SINGULAIR) 10 MG tablet TAKE 1 TABLET BY MOUTH EVERYDAY AT BEDTIME 30 tablet 0   No facility-administered medications prior to visit.    Allergies  Allergen Reactions   Plavix [Clopidogrel Bisulfate] Rash    Review of Systems As per HPI  PE:    04/26/2022    9:14 AM 03/25/2022    2:31 PM 10/28/2021    9:58 AM  Vitals with BMI  Height '5\' 9"'$  5' 9.09" 5' 9.09"  Weight 188 lbs 192 lbs 13 oz 189 lbs 3 oz  BMI 27.75 27.07 86.75  Systolic 99 449 97   Diastolic 61 75 54  Pulse 67 64 60    Physical Exam  Gen: Alert, well appearing.  Patient is oriented to person, place, time, and situation. AFFECT: pleasant, lucid thought and speech. CV: RRR, no m/r/g.   LUNGS: CTA bilat, nonlabored resps, good aeration in all lung fields. EXT: no clubbing or cyanosis.  no edema.    LABS:  Last CBC Lab Results  Component Value Date   WBC 8.4 10/28/2021   HGB 16.0 10/28/2021   HCT 47.4 10/28/2021   MCV 86.9 10/28/2021   MCH 29.2 06/06/2019   RDW 14.6 10/28/2021   PLT 167.0 10/28/2021  Last metabolic panel Lab Results  Component Value Date   GLUCOSE 100 (H) 10/28/2021   NA 141 10/28/2021   K 4.1 10/28/2021   CL 106 10/28/2021   CO2 28 10/28/2021   BUN 14 10/28/2021   CREATININE 0.94 10/28/2021   GFRNONAA 81 05/05/2020   CALCIUM 9.1 10/28/2021   PROT 6.4 08/21/2021   ALBUMIN 4.3 08/21/2021   BILITOT 0.8 08/21/2021   ALKPHOS 110 08/21/2021   AST 18 08/21/2021   ALT 21 08/21/2021   Last lipids Lab Results  Component Value Date   CHOL 114 08/21/2021   HDL 41 08/21/2021   LDLCALC 52 08/21/2021   TRIG 113 08/21/2021   CHOLHDL 2.8 08/21/2021   Last hemoglobin A1c Lab Results  Component Value Date   HGBA1C 5.8 (A) 04/26/2022   HGBA1C 5.8 04/26/2022   HGBA1C 5.8 04/26/2022   HGBA1C 5.8 04/26/2022   Last thyroid functions Lab Results  Component Value Date   TSH 1.07 04/01/2021   T3TOTAL 89.0 03/19/2016   IMPRESSION AND PLAN:  #1 prediabetes. POC Hba1c Is 5.8% today. Continue metformin 500 mg nightly.  #2 hypertension.  Blood pressure mildly low today.  Asymptomatic. Continue bisoprolol 5 mg a day. Metabolic panel today.  #3 hypercholesterolemia. Doing well on atorvastatin 80 mg daily and Zetia 10 mg daily. Lipid panel and hepatic panel today.  4.  Hypoth: Takes 112 mcg T4 on empty stomach w/out any other meds. TSH today.  An After Visit Summary was printed and given to the patient.  FOLLOW UP: Return in  about 3 months (around 07/25/2022) for routine chronic illness f/u. Next cpe 10/2022  Signed:  Crissie Sickles, MD           04/26/2022

## 2022-05-08 ENCOUNTER — Encounter: Payer: Self-pay | Admitting: Family Medicine

## 2022-05-19 DIAGNOSIS — D1801 Hemangioma of skin and subcutaneous tissue: Secondary | ICD-10-CM | POA: Diagnosis not present

## 2022-05-19 DIAGNOSIS — L814 Other melanin hyperpigmentation: Secondary | ICD-10-CM | POA: Diagnosis not present

## 2022-05-19 DIAGNOSIS — L570 Actinic keratosis: Secondary | ICD-10-CM | POA: Diagnosis not present

## 2022-05-19 DIAGNOSIS — D229 Melanocytic nevi, unspecified: Secondary | ICD-10-CM | POA: Diagnosis not present

## 2022-05-19 DIAGNOSIS — L821 Other seborrheic keratosis: Secondary | ICD-10-CM | POA: Diagnosis not present

## 2022-05-19 DIAGNOSIS — L578 Other skin changes due to chronic exposure to nonionizing radiation: Secondary | ICD-10-CM | POA: Diagnosis not present

## 2022-06-01 DIAGNOSIS — H903 Sensorineural hearing loss, bilateral: Secondary | ICD-10-CM | POA: Diagnosis not present

## 2022-07-09 DIAGNOSIS — H903 Sensorineural hearing loss, bilateral: Secondary | ICD-10-CM | POA: Diagnosis not present

## 2022-07-20 ENCOUNTER — Other Ambulatory Visit: Payer: Self-pay | Admitting: Cardiovascular Disease

## 2022-07-20 DIAGNOSIS — E7800 Pure hypercholesterolemia, unspecified: Secondary | ICD-10-CM

## 2022-07-21 DIAGNOSIS — H903 Sensorineural hearing loss, bilateral: Secondary | ICD-10-CM | POA: Diagnosis not present

## 2022-08-06 ENCOUNTER — Other Ambulatory Visit: Payer: Self-pay | Admitting: Family Medicine

## 2022-08-06 NOTE — Telephone Encounter (Signed)
Pt scheduled for 6/3, confirmed pt did not need a refill.

## 2022-08-20 NOTE — Patient Instructions (Signed)

## 2022-08-23 ENCOUNTER — Ambulatory Visit (INDEPENDENT_AMBULATORY_CARE_PROVIDER_SITE_OTHER): Payer: PPO | Admitting: Family Medicine

## 2022-08-23 ENCOUNTER — Encounter: Payer: Self-pay | Admitting: Family Medicine

## 2022-08-23 VITALS — BP 113/60 | HR 54 | Ht 69.0 in | Wt 184.6 lb

## 2022-08-23 DIAGNOSIS — I10 Essential (primary) hypertension: Secondary | ICD-10-CM | POA: Diagnosis not present

## 2022-08-23 DIAGNOSIS — E7800 Pure hypercholesterolemia, unspecified: Secondary | ICD-10-CM | POA: Diagnosis not present

## 2022-08-23 DIAGNOSIS — R7303 Prediabetes: Secondary | ICD-10-CM

## 2022-08-23 LAB — POCT GLYCOSYLATED HEMOGLOBIN (HGB A1C)
HbA1c POC (<> result, manual entry): 5.7 % (ref 4.0–5.6)
HbA1c, POC (controlled diabetic range): 5.7 % (ref 0.0–7.0)
HbA1c, POC (prediabetic range): 5.7 % (ref 5.7–6.4)
Hemoglobin A1C: 5.7 % — AB (ref 4.0–5.6)

## 2022-08-23 MED ORDER — LEVOTHYROXINE SODIUM 112 MCG PO TABS: 112.0000 ug | ORAL_TABLET | Freq: Every day | ORAL | 1 refills | Status: DC

## 2022-08-23 NOTE — Progress Notes (Signed)
OFFICE VISIT  08/23/2022  CC:  Chief Complaint  Patient presents with   Medical Management of Chronic Issues    Patient is a 72 y.o. male who presents for 39-month follow-up hypertension, hypercholesterolemia, and prediabetes. A/P as of last visit: "#1 prediabetes. POC Hba1c Is 5.8% today. Continue metformin 500 mg nightly.   #2 hypertension.  Blood pressure mildly low today.  Asymptomatic. Continue bisoprolol 5 mg a day. Metabolic panel today.   #3 hypercholesterolemia. Doing well on atorvastatin 80 mg daily and Zetia 10 mg daily. Lipid panel and hepatic panel today.   4.  Hypoth: Takes 112 mcg T4 on empty stomach w/out any other meds. TSH today."  INTERIM HX: Feels well. He is playing golf pretty regularly. He admits he eats too much fried food.   Past Medical History:  Diagnosis Date   Allergy    Aortic root enlargement ([facility])    CTA chest/aorta 04/2017:  Mild enlargement of aortic root, measuring 3.9 x 3.8 x 3.6 cm.   Arthralgia of both knees 09/2015   Plain films with minimal DJD changes medial compartment, o/w nl.   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  Rpt u/s 3 yrs.   Fatigue due to treatment    better when lopressor dose was decreased to 12.5mg  bid 04/2017.   Hearing loss    Bilat sensorineural; [facility] ENT eval 07/2019; pt candidate for cochlear implants   History of adenomatous polyp of colon 05/19/2016   Recall 04/2021 (Dr. Adela Lank)   Hyperlipidemia    Hypothyroidism    Nephrolithiasis    passed one stone approx 2008; no prob since (saw urologist briefly). UC visit 06/2020 R flank pain, stone suspected.   Normal nuclear stress test 2012   Prediabetes 02/2016   HbA1c 6.1%.  2019 A1c 6.3%. 2022 A1c 6.3%   Prostatitis    When pt in 30s and 40s; saw Dr. Patsi Sears and eventually got a TURP per pt's  description.  No probs since then.   Recurrent sinusitis    Sinus plain films 08/2014: bilat maxillary sinusitis (chronic)   S/P coronary artery stent placement 2005   RCA and LCX.  As of 02/2018 cardiology f/u, plan is to get ETT (pt doing fine, but was not having chest pain prior to last stent placement).   Tobacco dependence    current as of 01/2019    Past Surgical History:  Procedure Laterality Date   Aortic ultrasound  09/07/2018   Ectatic abd aorta (3.4 cm max diameter) but no aneurism.  Rpt u/s 3 yrs.   APPENDECTOMY     CARDIAC CATHETERIZATION  04/16/2003   NORMAL. EF 60%. TOTALLY OCCLUDED LEFT CIRCUMFLEX WITH SEVERE DISEASE, AND ULCERATED PLAQUE IN THE PROXIMAL RIGHT CORONARY ARTERY AND MILD ATHERSCLEROSIS IN THE LEFT ANTERIOR DESCENDING   CARDIOVASCULAR STRESS TEST  2009;2012; 10/2014; 03/2018   2012 normal nuclear stress test.  2016 ETT no ischemia.  ETT normal 03/2018.   COLONOSCOPY W/ POLYPECTOMY  05/19/2016   Multiple polyps--tubular adenoma.  Recall 5 yrs (04/2021).   CORONARY STENT PLACEMENT  04/19/2003   STENT PLACEMENT IN THE LEFT CIRCUMFLEX CORONARY AND RIGHT CORONARY ARTERY. MILD RESIDUAL STENOSIS IN THE MID PORTION OF THE LEFT ANTERIOR DESCENDING   TRANSTHORACIC ECHOCARDIOGRAM  05/03/2017   EF 60-65%, normal valves, mildly  enlarged aortic root.   Outpatient Medications Prior to Visit  Medication Sig Dispense Refill   aspirin 81 MG tablet Take 81 mg by mouth daily.     atorvastatin (LIPITOR) 80 MG tablet TAKE 1 TABLET BY MOUTH EVERY DAY 90 tablet 0   bisoprolol (ZEBETA) 5 MG tablet Take 1 tablet (5 mg total) by mouth daily. 90 tablet 3   Cyanocobalamin (VITAMIN B-12 PO) Take 1 tablet by mouth daily.     ezetimibe (ZETIA) 10 MG tablet Take 1 tablet (10 mg total) by mouth daily. 90 tablet 3   fluticasone (FLONASE) 50 MCG/ACT nasal spray Place 2 sprays into both nostrils daily. 48 g 3   levothyroxine (SYNTHROID) 112 MCG tablet Take 1 tablet (112 mcg total) by mouth daily.  90 tablet 1   metFORMIN (GLUCOPHAGE) 500 MG tablet Take 1 tablet (500 mg total) by mouth daily with supper. 90 tablet 3   montelukast (SINGULAIR) 10 MG tablet TAKE 1 TABLET BY MOUTH EVERYDAY AT BEDTIME 90 tablet 3   No facility-administered medications prior to visit.    Allergies  Allergen Reactions   Plavix [Clopidogrel Bisulfate] Rash    Review of Systems As per HPI  PE:    08/23/2022    1:09 PM 04/26/2022    9:14 AM 03/25/2022    2:31 PM  Vitals with BMI  Height 5\' 9"  5\' 9"  5' 9.09"  Weight 184 lbs 10 oz 188 lbs 192 lbs 13 oz  BMI 27.25 27.75 28.39  Systolic 113 99 129  Diastolic 60 61 75  Pulse 54 67 64     Physical Exam  Gen: Alert, well appearing.  Patient is oriented to person, place, time, and situation. AFFECT: pleasant, lucid thought and speech. CV: RRR, no m/r/g.   LUNGS: CTA bilat, nonlabored resps, good aeration in all lung fields. EXT: no clubbing or cyanosis.  no edema.    LABS:  Last CBC Lab Results  Component Value Date   WBC 8.4 10/28/2021   HGB 16.0 10/28/2021   HCT 47.4 10/28/2021   MCV 86.9 10/28/2021   MCH 29.2 06/06/2019   RDW 14.6 10/28/2021   PLT 167.0 10/28/2021   Last metabolic panel Lab Results  Component Value Date   GLUCOSE 101 (H) 04/26/2022   NA 141 04/26/2022   K 4.1 04/26/2022   CL 107 04/26/2022   CO2 24 04/26/2022   BUN 15 04/26/2022   CREATININE 0.97 04/26/2022   GFRNONAA 81 05/05/2020   CALCIUM 9.1 04/26/2022   PROT 6.4 04/26/2022   ALBUMIN 4.1 04/26/2022   BILITOT 0.7 04/26/2022   ALKPHOS 113 04/26/2022   AST 17 04/26/2022   ALT 23 04/26/2022   Last lipids Lab Results  Component Value Date   CHOL 110 04/26/2022   HDL 39.80 04/26/2022   LDLCALC 40 04/26/2022   TRIG 151.0 (H) 04/26/2022   CHOLHDL 3 04/26/2022   Last hemoglobin A1c Lab Results  Component Value Date   HGBA1C 5.8 (A) 04/26/2022   HGBA1C 5.8 04/26/2022   HGBA1C 5.8 04/26/2022   HGBA1C 5.8 04/26/2022   Last thyroid functions Lab  Results  Component Value Date   TSH 0.45 04/26/2022   T3TOTAL 89.0 03/19/2016   Lab Results  Component Value Date   PSA 1.48 02/01/2019   PSA 1.34 03/18/2016   IMPRESSION AND PLAN:  1 prediabetes. POC Hba1c Is 5.% today. Continue metformin 500 mg nightly. Electrolytes and creatinine today.  #2 hypertension.  Doing well. Continue bisoprolol 5 mg a  day. Metabolic panel today.   #3 hypercholesterolemia. Doing well on atorvastatin 80 mg daily and Zetia 10 mg daily. LDL was 40 about 4 months ago. Lipid panel and hepatic panel planned at next follow-up in 4 months.   4.  Hypoth: Takes 112 mcg T4 on empty stomach w/out any other meds. TSH 0.45 about 4 months ago.  An After Visit Summary was printed and given to the patient.  FOLLOW UP: No follow-ups on file. Next cpe 10/2022 Signed:  Santiago Bumpers, MD           08/23/2022

## 2022-08-24 LAB — BASIC METABOLIC PANEL
BUN: 13 mg/dL (ref 6–23)
CO2: 28 mEq/L (ref 19–32)
Calcium: 9.3 mg/dL (ref 8.4–10.5)
Chloride: 105 mEq/L (ref 96–112)
Creatinine, Ser: 0.99 mg/dL (ref 0.40–1.50)
GFR: 75.59 mL/min (ref >60.00)
Glucose, Bld: 83 mg/dL (ref 70–99)
Potassium: 4.3 mEq/L (ref 3.5–5.1)
Sodium: 141 mEq/L (ref 135–145)

## 2022-09-16 NOTE — Progress Notes (Signed)
Cardiology Office Note:    Date:  09/17/2022  ID:  Lawrence Hunter, DOB 1950-07-06, MRN [id number] PCP: Jeoffrey Massed, MD  [facility] Providers Cardiologist:  Verne Carrow, MD       Patient Profile:      Coronary artery disease NSTEMI in 03/2003 s/p 3 x 18 mm Cypher DES to the LCx, 3 x 33 mm Cypher DES to the RCA LHC 04/16/2003: LAD 50, LCx 100, RCA ulcerated plaque, EF 60 Myoview 09/2010: No ischemia ETT 03/29/2018: Exercised 6 minutes, 90% PMHR, negative test Ascending aorta enlargement TTE 05/03/2017: Severe focal basal and moderate LVH, EF 60-65, no RWMA, aortic root 42 mm, ascending aorta 39 mm, moderate LAE CTA 05/19/2017: No thoracic aortic aneurysm Ectatic abdominal aorta US 09/06/2018 Hyperlipidemia Tobacco use Hypothyroidism Prediabetes Aortic atherosclerosis      History of Present Illness:   Lawrence Hunter is a 72 y.o. male returns for follow-up of CAD.  He was last seen by Dr. Clifton James in May 2023.  He is here alone.  He has not had chest pain, shortness of breath, syncope, orthopnea, leg edema.  He does note fatigue at times.  This typically occurs after working outside and sweating significantly.  Review of Systems  Gastrointestinal:  Negative for hematochezia and melena.  Genitourinary:  Negative for hematuria.   see the HPI    Studies Reviewed:   EKG Interpretation Date/Time:  Friday September 17 2022 10:21:55 EDT Ventricular Rate:  64 PR Interval:  186 QRS Duration:  82 QT Interval:  394 QTC Calculation: 406 R Axis:   31  Text Interpretation: Sinus rhythm with Premature atrial complexes No change when compared to prior ECGs Confirmed by Tereso Newcomer [phone]) on 09/17/2022 10:39:05 AM   Risk Assessment/Calculations:           Physical Exam:   VS:  BP 116/62   Pulse 64   Ht 5\' 11"  (1.803 m)   Wt 183 lb 6.4 oz (83.2 kg)   SpO2 97%   BMI 25.58 kg/m    Wt Readings from Last 3 Encounters:  09/17/22 183 lb 6.4 oz (83.2 kg)  08/23/22  184 lb 9.6 oz (83.7 kg)  04/26/22 188 lb (85.3 kg)    Constitutional:      Appearance: Healthy appearance. Not in distress.  Neck:     Vascular: No carotid bruit. JVD normal.  Pulmonary:     Breath sounds: Normal breath sounds. No wheezing. No rales.  Cardiovascular:     Normal rate. Regular rhythm.     Murmurs: There is no murmur.  Edema:    Peripheral edema absent.  Abdominal:     Palpations: Abdomen is soft.       ASSESSMENT AND PLAN:   CAD (coronary artery disease) History of non-STEMI in 2005 treated with a DES x 2 to the RCA.  Myoview in 2012 was negative for ischemia.  Exercise test in 2020 was low risk.  He is doing well without chest discomfort to suggest angina.  Continue aspirin 81 mg daily, Lipitor 80 mg daily.  He does note fatigue after working outside and falls asleep easily.  He does not have any other symptoms consistent with sleep apnea.  Question if his blood pressure is running low contributing to this.  I have asked him to change his bisoprolol to 5 mg every evening.  If his symptoms continue, we can consider decreasing this to 2.5 mg daily.  Follow-up 1 year.  Pure hypercholesterolemia Goal LDL <  55.  Labs from primary care reviewed.  LDL is at goal in February 2024 at 40.  Continue Lipitor 80 mg daily, Zetia 10 mg daily.  Tobacco use disorder He wants to quit but continues to have difficulty.    Dispo:  Return in about 1 year (around 09/17/2023) for Routine Follow Up with Dr. Clifton James.  Signed, Tereso Newcomer, PA-C

## 2022-09-17 ENCOUNTER — Encounter: Payer: Self-pay | Admitting: Physician Assistant

## 2022-09-17 ENCOUNTER — Ambulatory Visit: Payer: PPO | Attending: Physician Assistant | Admitting: Physician Assistant

## 2022-09-17 VITALS — BP 116/62 | HR 64 | Ht 71.0 in | Wt 183.4 lb

## 2022-09-17 DIAGNOSIS — E7800 Pure hypercholesterolemia, unspecified: Secondary | ICD-10-CM

## 2022-09-17 DIAGNOSIS — F17200 Nicotine dependence, unspecified, uncomplicated: Secondary | ICD-10-CM | POA: Diagnosis not present

## 2022-09-17 DIAGNOSIS — I2510 Atherosclerotic heart disease of native coronary artery without angina pectoris: Secondary | ICD-10-CM | POA: Diagnosis not present

## 2022-09-17 NOTE — Assessment & Plan Note (Addendum)
History of non-STEMI in 2005 treated with a DES x 2 to the RCA.  Myoview in 2012 was negative for ischemia.  Exercise test in 2020 was low risk.  He is doing well without chest discomfort to suggest angina.  Continue aspirin 81 mg daily, Lipitor 80 mg daily.  He does note fatigue after working outside and falls asleep easily.  He does not have any other symptoms consistent with sleep apnea.  Question if his blood pressure is running low contributing to this.  I have asked him to change his bisoprolol to 5 mg every evening.  If his symptoms continue, we can consider decreasing this to 2.5 mg daily.  Follow-up 1 year.

## 2022-09-17 NOTE — Assessment & Plan Note (Signed)
He wants to quit but continues to have difficulty.

## 2022-09-17 NOTE — Assessment & Plan Note (Signed)
Goal LDL <55.  Labs from primary care reviewed.  LDL is at goal in February 2024 at 40.  Continue Lipitor 80 mg daily, Zetia 10 mg daily.

## 2022-09-17 NOTE — Patient Instructions (Signed)
Medication Instructions:  Your physician recommends that you continue on your current medications as directed. Please refer to the Current Medication list given to you today, BUT CHANGE THE BISOPROLOL TO TAKING IN THE EVENING.  IF YOUR FATIGUE DOESN'T GET ANY BETTER, CALL AND LET us KNOW.  *If you need a refill on your cardiac medications before your next appointment, please call your pharmacy*   Lab Work: None ordered  If you have labs (blood work) drawn today and your tests are completely normal, you will receive your results only by: MyChart Message (if you have MyChart) OR A paper copy in the mail If you have any lab test that is abnormal or we need to change your treatment, we will call you to review the results.   Testing/Procedures: None ordered    Follow-Up: At [facility], you and your health needs are our priority.  As part of our continuing mission to provide you with exceptional heart care, we have created designated Provider Care Teams.  These Care Teams include your primary Cardiologist (physician) and Advanced Practice Providers (APPs -  Physician Assistants and Nurse Practitioners) who all work together to provide you with the care you need, when you need it.  We recommend signing up for the patient portal called "MyChart".  Sign up information is provided on this After Visit Summary.  MyChart is used to connect with patients for Virtual Visits (Telemedicine).  Patients are able to view lab/test results, encounter notes, upcoming appointments, etc.  Non-urgent messages can be sent to your provider as well.   To learn more about what you can do with MyChart, go to [email or web address].    Your next appointment:   12 month(s)  Provider:   Verne Carrow, MD     Other Instructions

## 2022-10-17 ENCOUNTER — Other Ambulatory Visit: Payer: Self-pay | Admitting: Cardiovascular Disease

## 2022-10-17 DIAGNOSIS — E7800 Pure hypercholesterolemia, unspecified: Secondary | ICD-10-CM

## 2022-11-04 ENCOUNTER — Encounter (INDEPENDENT_AMBULATORY_CARE_PROVIDER_SITE_OTHER): Payer: Self-pay

## 2022-12-08 ENCOUNTER — Ambulatory Visit (INDEPENDENT_AMBULATORY_CARE_PROVIDER_SITE_OTHER): Payer: PPO

## 2022-12-08 VITALS — Wt 183.0 lb

## 2022-12-08 DIAGNOSIS — Z0000 Encounter for general adult medical examination without abnormal findings: Secondary | ICD-10-CM

## 2022-12-08 NOTE — Patient Instructions (Signed)
Lawrence Hunter , Thank you for taking time to come for your Medicare Wellness Visit. I appreciate your ongoing commitment to your health goals. Please review the following plan we discussed and let me know if I can assist you in the future.   Referrals/Orders/Follow-Ups/Clinician Recommendations: pt stated stay healthy and exercise more. Pt will follow up with Flu vaccine   This is a list of the screening recommended for you and due dates:  Health Maintenance  Topic Date Due   Flu Shot  10/21/2022   COVID-19 Vaccine (4 - 2023-24 season) 11/21/2022   Medicare Annual Wellness Visit  12/08/2023   DTaP/Tdap/Td vaccine (2 - Td or Tdap) 08/20/2024   Colon Cancer Screening  05/19/2026   Pneumonia Vaccine  Completed   Hepatitis C Screening  Completed   Zoster (Shingles) Vaccine  Completed   HPV Vaccine  Aged Out    Advanced directives: (Copy Requested) Please bring a copy of your health care power of attorney and living will to the office to be added to your chart at your convenience.  Next Medicare Annual Wellness Visit scheduled for next year: Yes

## 2022-12-08 NOTE — Progress Notes (Addendum)
Subjective:   Lawrence Hunter is a 72 y.o. male who presents for Medicare Annual/Subsequent preventive examination.  Visit Complete: Virtual  I connected with  Lawrence Hunter on 12/14/22 by a audio enabled telemedicine application and verified that I am speaking with the correct person using two identifiers.  Patient Location: Home  Provider Location: Home Office  I discussed the limitations of evaluation and management by telemedicine. The patient expressed understanding and agreed to proceed.  Vital Signs: Unable to obtain new vitals due to this being a telehealth visit.   Cardiac Risk Factors include: advanced age (>50men, >92 women);male gender;dyslipidemia     Objective:    Today's Vitals   12/08/22 1511  Weight: 183 lb (83 kg)   Body mass index is 25.52 kg/m.     12/08/2022    3:14 PM 11/18/2021    9:27 AM 11/12/2020   10:48 AM 09/03/2018    7:59 PM 05/19/2016   10:32 AM 05/05/2016    1:59 PM  Advanced Directives  Does Patient Have a Medical Advance Directive? Yes Yes Yes Yes Yes Yes  Type of Estate agent of [place];Living will Healthcare Power of [place];Living will Healthcare Power of [employer] of [place];Living will Healthcare Power of [place];Living will  Copy of Healthcare Power of Attorney in Chart? No - copy requested Yes - validated most recent copy scanned in chart (See row information) Yes - validated most recent copy scanned in chart (See row information)       Current Medications (verified) Outpatient Encounter Medications as of 12/08/2022  Medication Sig   aspirin 81 MG tablet Take 81 mg by mouth daily.   atorvastatin (LIPITOR) 80 MG tablet TAKE 1 TABLET BY MOUTH EVERY DAY   bisoprolol (ZEBETA) 5 MG tablet Take 1 tablet (5 mg total) by mouth daily. (Patient taking differently: Take 5 mg by mouth every evening.)   Cyanocobalamin (VITAMIN B-12 PO) Take 1 tablet by mouth daily.   ezetimibe (ZETIA) 10 MG  tablet Take 1 tablet (10 mg total) by mouth daily.   fluticasone (FLONASE) 50 MCG/ACT nasal spray Place 2 sprays into both nostrils daily.   levothyroxine (SYNTHROID) 112 MCG tablet Take 1 tablet (112 mcg total) by mouth daily.   metFORMIN (GLUCOPHAGE) 500 MG tablet Take 1 tablet (500 mg total) by mouth daily with supper.   montelukast (SINGULAIR) 10 MG tablet TAKE 1 TABLET BY MOUTH EVERYDAY AT BEDTIME   No facility-administered encounter medications on file as of 12/08/2022.    Allergies (verified) Plavix [clopidogrel bisulfate]   History: Past Medical History:  Diagnosis Date   Allergy    Aortic root enlargement (HCC)    CTA chest/aorta 04/2017:  Mild enlargement of aortic root, measuring 3.9 x 3.8 x 3.6 cm.   Arthralgia of both knees 09/2015   Plain films with minimal DJD changes medial compartment, o/w nl.   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  Rpt u/s 3 yrs.   Fatigue due to treatment    better when lopressor dose was decreased to 12.5mg  bid 04/2017.   Hearing loss    Bilat sensorineural; [facility] ENT eval 07/2019; pt candidate for cochlear implants   History of adenomatous polyp of colon 05/19/2016   Recall 04/2021 (Dr. Adela Lank)   Hyperlipidemia  Hypothyroidism    Nephrolithiasis    passed one stone approx 2008; no prob since (saw urologist briefly). UC visit 06/2020 R flank pain, stone suspected.   Normal nuclear stress test 2012   Prediabetes 02/2016   HbA1c 6.1%.  2019 A1c 6.3%. 2022 A1c 6.3%   Prostatitis    When pt in 30s and 40s; saw Dr. Patsi Sears and eventually got a TURP per pt's description.  No probs since then.   Recurrent sinusitis    Sinus plain films 08/2014: bilat maxillary sinusitis (chronic)   S/P coronary artery stent placement 2005   RCA and LCX.  As of 02/2018 cardiology f/u, plan is to get ETT  (pt doing fine, but was not having chest pain prior to last stent placement).   Tobacco dependence    current as of 01/2019   Past Surgical History:  Procedure Laterality Date   Aortic ultrasound  09/07/2018   Ectatic abd aorta (3.4 cm max diameter) but no aneurism.  Rpt u/s 3 yrs.   APPENDECTOMY     CARDIAC CATHETERIZATION  04/16/2003   NORMAL. EF 60%. TOTALLY OCCLUDED LEFT CIRCUMFLEX WITH SEVERE DISEASE, AND ULCERATED PLAQUE IN THE PROXIMAL RIGHT CORONARY ARTERY AND MILD ATHERSCLEROSIS IN THE LEFT ANTERIOR DESCENDING   CARDIOVASCULAR STRESS TEST  2009;2012; 10/2014; 03/2018   2012 normal nuclear stress test.  2016 ETT no ischemia.  ETT normal 03/2018.   COLONOSCOPY W/ POLYPECTOMY  05/19/2016   Multiple polyps--tubular adenoma.  Recall 5 yrs (04/2021).   CORONARY STENT PLACEMENT  04/19/2003   STENT PLACEMENT IN THE LEFT CIRCUMFLEX CORONARY AND RIGHT CORONARY ARTERY. MILD RESIDUAL STENOSIS IN THE MID PORTION OF THE LEFT ANTERIOR DESCENDING   TRANSTHORACIC ECHOCARDIOGRAM  05/03/2017   EF 60-65%, normal valves, mildly enlarged aortic root.  f/u CT angio showed no signif aortic root enlargement   Family History  Problem Relation Age of Onset   Hypertension Mother    Stroke Mother    Hypertension Father    Stroke Father    Prostate cancer Father    Heart attack Brother        STENT   Stroke Brother    Colon cancer Neg Hx    Social History   Socioeconomic History   Marital status: Married    Spouse name: Not on file   Number of children: 2   Years of education: Not on file   Highest education level: Not on file  Occupational History   Occupation: Airline pilot Rep  Tobacco Use   Smoking status: Every Day    Current packs/day: 1.00    Types: Cigarettes   Smokeless tobacco: Never  Vaping Use   Vaping status: Never Used  Substance and Sexual Activity   Alcohol use: No   Drug use: No   Sexual activity: Yes  Other Topics Concern   Not on file  Social History Narrative   Married, 1  son in [place] and one in [place].   Lives in [place].   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   Tob: 40 pack-yr hx, current as of 08/2020.   Alcohol: social/rare.   Social Determinants of Health   Financial Resource Strain: Low Risk  (12/08/2022)   Overall Financial Resource Strain (CARDIA)    Difficulty of Paying Living Expenses: Not hard at all  Food Insecurity: No Food Insecurity (12/08/2022)   Hunger Vital Sign    Worried About Running Out of Food in the Last  Year: Never true    Ran Out of Food in the Last Year: Never true  Transportation Needs: No Transportation Needs (12/08/2022)   PRAPARE - Administrator, Civil Service (Medical): No    Lack of Transportation (Non-Medical): No  Physical Activity: Sufficiently Active (12/08/2022)   Exercise Vital Sign    Days of Exercise per Week: 5 days    Minutes of Exercise per Session: 30 min  Stress: No Stress Concern Present (12/08/2022)   [employer] of Occupational Health - Occupational Stress Questionnaire    Feeling of Stress : Not at all  Social Connections: Moderately Integrated (12/08/2022)   Social Connection and Isolation Panel [NHANES]    Frequency of Communication with Friends and Family: More than three times a week    Frequency of Social Gatherings with Friends and Family: More than three times a week    Attends Religious Services: 1 to 4 times per year    Active Member of [employer] or Organizations: No    Attends Engineer, structural: Never    Marital Status: Married    Tobacco Counseling Ready to quit: Not Answered Counseling given: Not Answered   Clinical Intake:  Pre-visit preparation completed: Yes  Pain : No/denies pain     BMI - recorded: 25.52 Nutritional Status: BMI 25 -29 Overweight Nutritional Risks: None Diabetes: No  How often do you need to have someone help you when you read instructions, pamphlets, or other  written materials from your doctor or pharmacy?: 1 - Never  Interpreter Needed?: No  Information entered by :: Lanier Ensign, LPN   Activities of Daily Living    12/08/2022    3:12 PM  In your present state of health, do you have any difficulty performing the following activities:  Hearing? 1  Comment hearing aids  Vision? 0  Difficulty concentrating or making decisions? 0  Walking or climbing stairs? 0  Dressing or bathing? 0  Doing errands, shopping? 0  Preparing Food and eating ? N  Using the Toilet? N  In the past six months, have you accidently leaked urine? N  Do you have problems with loss of bowel control? N  Managing your Medications? N  Managing your Finances? N  Housekeeping or managing your Housekeeping? N    Patient Care Team: Jeoffrey Massed, MD as PCP - General (Family Medicine) Kathleene Hazel, MD as PCP - Cardiology (Cardiology) Kathleene Hazel, MD as Consulting Physician (Cardiology) Armbruster, Willaim Rayas, MD as Consulting Physician (Gastroenterology) Kathleene Hazel, MD as Consulting Physician (Cardiology)  Indicate any recent Medical Services you may have received from other than [facility] providers in the past year (date may be approximate).     Assessment:   This is a routine wellness examination for Lawrence Hunter.  Hearing/Vision screen Hearing Screening - Comments:: Pt wears hearing aids  Vision Screening - Comments:: Pt follows up with my eye Dr for annual eye exams    Goals Addressed             This Visit's Progress    Patient Stated       Stay healthy and exercise more        Depression Screen    12/08/2022    3:15 PM 08/23/2022    1:15 PM 04/26/2022    9:15 AM 11/18/2021    9:17 AM 10/28/2021   10:12 AM 04/01/2021    1:04 PM 11/12/2020   11:01 AM  PHQ 2/9 Scores  PHQ -  2 Score 0 0 0 0 0 0 0    Fall Risk    12/08/2022    3:16 PM 08/23/2022    1:15 PM 04/26/2022    9:15 AM 11/18/2021    9:26 AM 10/28/2021   10:12 AM   Fall Risk   Falls in the past year? 0 0 0 0 0  Number falls in past yr: 0 0 0 0 0  Injury with Fall? 0 0 0 0 0  Risk for fall due to : Impaired vision No Fall Risks No Fall Risks No Fall Risks No Fall Risks  Follow up Falls prevention discussed Falls evaluation completed Falls evaluation completed Falls evaluation completed Falls evaluation completed    MEDICARE RISK AT HOME: Medicare Risk at Home Any stairs in or around the home?: Yes If so, are there any without handrails?: No Home free of Hunter throw rugs in walkways, pet beds, electrical cords, etc?: Yes Adequate lighting in your home to reduce risk of falls?: Yes Life alert?: No Use of a cane, walker or w/c?: No Grab bars in the bathroom?: Yes Shower chair or bench in shower?: Yes Elevated toilet seat or a handicapped toilet?: Yes  TIMED UP AND GO:  Was the test performed?  No    Cognitive Function:        11/18/2021    9:28 AM  6CIT Screen  What Year? 0 points  What month? 0 points  What time? 0 points  Count back from 20 0 points  Months in reverse 0 points  Repeat phrase 0 points  Total Score 0 points    Immunizations Immunization History  Administered Date(s) Administered   Fluad Quad(high Dose 65+) 01/01/2022   Influenza, High Dose Seasonal PF 02/20/2015, 12/31/2015, 12/23/2017, 12/31/2019, 12/23/2020   Influenza, Quadrivalent, Recombinant, Inj, Pf 01/03/2019   Influenza-Unspecified 02/11/2014, 12/23/2017   Moderna Covid-19 Vaccine Bivalent Booster 30yrs & up 11/19/2020   Moderna Sars-Covid-2 Vaccination 04/26/2019, 05/21/2019   Pneumococcal Conjugate-13 08/21/2014   Pneumococcal Polysaccharide-23 03/18/2016   Tdap 08/21/2014   Zoster Recombinant(Shingrix) 12/15/2021, 04/28/2022    TDAP status: Up to date  Flu Vaccine status: Due, Education has been provided regarding the importance of this vaccine. Advised may receive this vaccine at local pharmacy or Health Dept. Aware to provide a copy of the  vaccination record if obtained from local pharmacy or Health Dept. Verbalized acceptance and understanding.  Pneumococcal vaccine status: Up to date  Covid-19 vaccine status: Information provided on how to obtain vaccines.   Qualifies for Shingles Vaccine? Yes   Zostavax completed Yes   Shingrix Completed?: Yes  Screening Tests Health Maintenance  Topic Date Due   COVID-19 Vaccine (4 - 2023-24 season) 11/21/2022   INFLUENZA VACCINE  06/20/2023 (Originally 10/21/2022)   Medicare Annual Wellness (AWV)  12/08/2023   DTaP/Tdap/Td (2 - Td or Tdap) 08/20/2024   Colonoscopy  05/19/2026   Pneumonia Vaccine 44+ Years old  Completed   Hepatitis C Screening  Completed   Zoster Vaccines- Shingrix  Completed   HPV VACCINES  Aged Out    Health Maintenance  Health Maintenance Due  Topic Date Due   COVID-19 Vaccine (4 - 2023-24 season) 11/21/2022    Colorectal cancer screening: Type of screening: Colonoscopy. Completed 05/19/16. Repeat every 10 years  Additional Screening:  Hepatitis C Screening:  Completed 03/18/16  Vision Screening: Recommended annual ophthalmology exams for early detection of glaucoma and other disorders of the eye. Is the patient up to date with their annual  eye exam?  Yes  Who is the provider or what is the name of the office in which the patient attends annual eye exams? [facility]  If pt is not established with a provider, would they like to be referred to a provider to establish care? No .   Dental Screening: Recommended annual dental exams for proper oral hygiene   Community Resource Referral / Chronic Care Management: CRR required this visit?  No   CCM required this visit?  No     Plan:     I have personally reviewed and noted the following in the patient's chart:   Medical and social history Use of alcohol, tobacco or illicit drugs  Current medications and supplements including opioid prescriptions. Patient is not currently taking opioid  prescriptions. Functional ability and status Nutritional status Physical activity Advanced directives List of other physicians Hospitalizations, surgeries, and ER visits in previous 12 months Vitals Screenings to include cognitive, depression, and falls Referrals and appointments  In addition, I have reviewed and discussed with patient certain preventive protocols, quality metrics, and best practice recommendations. A written personalized care plan for preventive services as well as general preventive health recommendations were provided to patient.     Marzella Schlein, LPN   [id number]   After Visit Summary: (MyChart) Due to this being a telephonic visit, the after visit summary with patients personalized plan was offered to patient via MyChart   Nurse Notes: none

## 2023-02-02 ENCOUNTER — Other Ambulatory Visit: Payer: Self-pay | Admitting: Family Medicine

## 2023-02-04 ENCOUNTER — Other Ambulatory Visit: Payer: Self-pay | Admitting: Family Medicine

## 2023-03-04 ENCOUNTER — Other Ambulatory Visit: Payer: Self-pay | Admitting: Family Medicine

## 2023-03-28 ENCOUNTER — Other Ambulatory Visit: Payer: Self-pay | Admitting: Family Medicine

## 2023-03-31 ENCOUNTER — Other Ambulatory Visit: Payer: Self-pay | Admitting: Family Medicine

## 2023-03-31 NOTE — Telephone Encounter (Signed)
 Copied from CRM [id number]. Topic: Clinical - Medication Refill >> Mar 31, 2023  1:18 PM Eleanor C wrote: Most Recent Primary Care Visit:  Provider: JAYLENE ELLOUISE DEL  Department: [facility]  Visit Type: MEDICARE AWV, SEQUENTIAL  Date: 12/08/2022  Medication: metFORMIN  (GLUCOPHAGE ) 500 MG tablet  Has the patient contacted their pharmacy? Yes (Agent: If no, request that the patient contact the pharmacy for the refill. If patient does not wish to contact the pharmacy document the reason why and proceed with request.) (Agent: If yes, when and what did the pharmacy advise?)  Is this the correct pharmacy for this prescription? Yes If no, delete pharmacy and type the correct one.  This is the patient's preferred pharmacy:  [id number] - [place], [place] - [address] [place] [address] Phone: [phone] Fax: [phone]   Has the prescription been filled recently? No  Is the patient out of the medication? No  Has the patient been seen for an appointment in the last year OR does the patient have an upcoming appointment? Yes  Can we respond through MyChart? Yes  Agent: Please be advised that Rx refills may take up to 3 business days. We ask that you follow-up with your pharmacy.

## 2023-03-31 NOTE — Telephone Encounter (Signed)
 Given 30 tabs on 03/04/2023 and needs OV for more.

## 2023-04-01 NOTE — Telephone Encounter (Signed)
 Request handled and routed previously. Duplicate entry.

## 2023-04-11 NOTE — Progress Notes (Unsigned)
Office Note 04/12/2023  CC:  Chief Complaint  Patient presents with   Medical Management of Chronic Issues    Pt is not fasting. Pt mentions wanting to reduce the dosage of Bisoprolol.    Patient is a 73 y.o. male who is here for annual health maintenance exam and 6 mo f/u HTN, prediabetes, HLD, and hypothyroidism.  INTERIM HX: Lucia states he is feeling well other than some increased feeling of sleepiness in the daytime.  He has felt this for several years now but is more concerned about it lately.  When he is sitting at home in his chair in the middle of the day he feels like it is easy to go to sleep.  If he is occupied and up and around on his feet that is not a problem.  He says he does not snore.  No excessive sleepiness while driving. No known apneic events and sleep.  No home blood pressure or heart rate monitoring. In the past he has had some fatigue issues on Lopressor.  It seemed to be better with switch to bisoprolol.  Past Medical History:  Diagnosis Date   Allergy    Aortic root enlargement (HCC)    CTA chest/aorta 04/2017:  Mild enlargement of aortic root, measuring 3.9 x 3.8 x 3.6 cm.   Arthralgia of both knees 09/2015   Plain films with minimal DJD changes medial compartment, o/w nl.   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  Rpt u/s 3 yrs.   Fatigue due to treatment    better when lopressor dose was decreased to 12.5mg  bid 04/2017.   Hearing loss    Bilat sensorineural; [facility] ENT eval 07/2019; pt candidate for cochlear implants   History of adenomatous polyp of colon 05/19/2016   Recall 04/2021 (Dr. Adela Lank)   Hyperlipidemia    Hypothyroidism    Nephrolithiasis    passed one stone approx 2008; no prob since (saw urologist briefly). UC visit 06/2020 R flank pain, stone suspected.   Normal nuclear stress  test 2012   Prediabetes 02/2016   HbA1c 6.1%.  2019 A1c 6.3%. 2022 A1c 6.3%   Prostatitis    When pt in 30s and 40s; saw Dr. Patsi Sears and eventually got a TURP per pt's description.  No probs since then.   Recurrent sinusitis    Sinus plain films 08/2014: bilat maxillary sinusitis (chronic)   S/P coronary artery stent placement 2005   RCA and LCX.  As of 02/2018 cardiology f/u, plan is to get ETT (pt doing fine, but was not having chest pain prior to last stent placement).   Tobacco dependence    current as of 01/2019    Past Surgical History:  Procedure Laterality Date   Aortic ultrasound  09/07/2018   Ectatic abd aorta (3.4 cm max diameter) but no aneurism.  Rpt u/s 3 yrs.   APPENDECTOMY     CARDIAC CATHETERIZATION  04/16/2003   NORMAL. EF 60%. TOTALLY OCCLUDED LEFT CIRCUMFLEX WITH SEVERE DISEASE, AND ULCERATED PLAQUE IN THE PROXIMAL RIGHT CORONARY ARTERY AND MILD ATHERSCLEROSIS IN THE LEFT ANTERIOR DESCENDING   CARDIOVASCULAR STRESS TEST  2009;2012; 10/2014; 03/2018   2012 normal nuclear stress test.  2016 ETT no ischemia.  ETT normal 03/2018.   COLONOSCOPY W/ POLYPECTOMY  05/19/2016   Multiple  polyps--tubular adenoma.  Recall 5 yrs (04/2021).   CORONARY STENT PLACEMENT  04/19/2003   STENT PLACEMENT IN THE LEFT CIRCUMFLEX CORONARY AND RIGHT CORONARY ARTERY. MILD RESIDUAL STENOSIS IN THE MID PORTION OF THE LEFT ANTERIOR DESCENDING   TRANSTHORACIC ECHOCARDIOGRAM  05/03/2017   EF 60-65%, normal valves, mildly enlarged aortic root.  f/u CT angio showed no signif aortic root enlargement    Family History  Problem Relation Age of Onset   Hypertension Mother    Stroke Mother    Hypertension Father    Stroke Father    Prostate cancer Father    Heart attack Brother        STENT   Stroke Brother    Colon cancer Neg Hx     Social History   Socioeconomic History   Marital status: Married    Spouse name: Not on file   Number of children: 2   Years of education: Not on file    Highest education level: Not on file  Occupational History   Occupation: Airline pilot Rep  Tobacco Use   Smoking status: Every Day    Current packs/day: 1.00    Types: Cigarettes   Smokeless tobacco: Never  Vaping Use   Vaping status: Never Used  Substance and Sexual Activity   Alcohol use: No   Drug use: No   Sexual activity: Yes  Other Topics Concern   Not on file  Social History Narrative   Married, 1 son in [place] and one in [place].   Lives in [place].   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   Tob: 40 pack-yr hx, current as of 08/2020.   Alcohol: social/rare.   Social Drivers of Corporate investment banker Strain: Low Risk  (12/08/2022)   Overall Financial Resource Strain (CARDIA)    Difficulty of Paying Living Expenses: Not hard at all  Food Insecurity: No Food Insecurity (12/08/2022)   Hunger Vital Sign    Worried About Running Out of Food in the Last Year: Never true    Ran Out of Food in the Last Year: Never true  Transportation Needs: No Transportation Needs (12/08/2022)   PRAPARE - Administrator, Civil Service (Medical): No    Lack of Transportation (Non-Medical): No  Physical Activity: Sufficiently Active (12/08/2022)   Exercise Vital Sign    Days of Exercise per Week: 5 days    Minutes of Exercise per Session: 30 min  Stress: No Stress Concern Present (12/08/2022)   [employer] of Occupational Health - Occupational Stress Questionnaire    Feeling of Stress : Not at all  Social Connections: Moderately Integrated (12/08/2022)   Social Connection and Isolation Panel [NHANES]    Frequency of Communication with Friends and Family: More than three times a week    Frequency of Social Gatherings with Friends and Family: More than three times a week    Attends Religious Services: 1 to 4 times per year    Active Member of [employer] or Organizations: No    Attends Banker Meetings: Never     Marital Status: Married  Catering manager Violence: Not At Risk (12/08/2022)   Humiliation, Afraid, Rape, and Kick questionnaire    Fear of Current or Ex-Partner: No    Emotionally Abused: No    Physically Abused: No    Sexually Abused: No    Outpatient Medications Prior to Visit  Medication Sig Dispense Refill   aspirin  81 MG tablet Take 81 mg by mouth daily.     atorvastatin (LIPITOR) 80 MG tablet TAKE 1 TABLET BY MOUTH EVERY DAY 90 tablet 3   bisoprolol (ZEBETA) 5 MG tablet Take 1 tablet (5 mg total) by mouth daily. (Patient taking differently: Take 5 mg by mouth every evening.) 90 tablet 3   Cyanocobalamin (VITAMIN B-12 PO) Take 1 tablet by mouth daily.     ezetimibe (ZETIA) 10 MG tablet Take 1 tablet (10 mg total) by mouth daily. 90 tablet 3   fluticasone (FLONASE) 50 MCG/ACT nasal spray Place 2 sprays into both nostrils daily. 48 g 3   levothyroxine (SYNTHROID) 112 MCG tablet Take 1 tablet (112 mcg total) by mouth daily. 90 tablet 1   metFORMIN (GLUCOPHAGE) 500 MG tablet TAKE 1 TABLET BY MOUTH DAILY WITH SUPPER. 30 tablet 0   montelukast (SINGULAIR) 10 MG tablet TAKE 1 TABLET BY MOUTH EVERYDAY AT BEDTIME 90 tablet 3   No facility-administered medications prior to visit.    Allergies  Allergen Reactions   Plavix [Clopidogrel Bisulfate] Rash    Review of Systems  Constitutional:  Negative for appetite change, chills, fatigue and fever.  HENT:  Negative for congestion, dental problem, ear pain and sore throat.   Eyes:  Negative for discharge, redness and visual disturbance.  Respiratory:  Negative for cough, chest tightness, shortness of breath and wheezing.   Cardiovascular:  Negative for chest pain, palpitations and leg swelling.  Gastrointestinal:  Negative for abdominal pain, blood in stool, diarrhea, nausea and vomiting.  Genitourinary:  Negative for difficulty urinating, dysuria, flank pain, frequency, hematuria and urgency.  Musculoskeletal:  Negative for  arthralgias, back pain, joint swelling, myalgias and neck stiffness.  Skin:  Negative for pallor and rash.  Neurological:  Negative for dizziness, speech difficulty, weakness and headaches.  Hematological:  Negative for adenopathy. Does not bruise/bleed easily.  Psychiatric/Behavioral:  Negative for confusion and sleep disturbance. The patient is not nervous/anxious.     PE;    04/12/2023    8:58 AM 12/08/2022    3:11 PM 09/17/2022   10:58 AM  Vitals with BMI  Weight 189 lbs 183 lbs   Systolic 126  116  Diastolic 79  62  Pulse 70      Gen: Alert, well appearing.  Patient is oriented to person, place, time, and situation. AFFECT: pleasant, lucid thought and speech. ENT: Ears: EACs clear, normal epithelium.  TMs with good light reflex and landmarks bilaterally.  Eyes: no injection, icteris, swelling, or exudate.  EOMI, PERRLA. Nose: no drainage or turbinate edema/swelling.  No injection or focal lesion.  Mouth: lips without lesion/swelling.  Oral mucosa pink and moist.  Dentition intact and without obvious caries or gingival swelling.  Oropharynx without erythema, exudate, or swelling.  Neck: supple/nontender.  No LAD, mass, or TM.  Carotid pulses 2+ bilaterally, without bruits. CV: RRR, no m/r/g.   LUNGS: CTA bilat, nonlabored resps, good aeration in all lung fields. ABD: soft, NT, ND, BS normal.  No hepatospenomegaly or mass.  No bruits. EXT: no clubbing, cyanosis, or edema.  Musculoskeletal: no joint swelling, erythema, warmth, or tenderness.  ROM of all joints intact. Skin - no sores or suspicious lesions or rashes or color changes  Pertinent labs:  Lab Results  Component Value Date   TSH 0.45 04/26/2022   Lab Results  Component Value Date   WBC 8.4 10/28/2021   HGB 16.0 10/28/2021   HCT 47.4 10/28/2021   MCV 86.9 10/28/2021  PLT 167.0 10/28/2021   Lab Results  Component Value Date   CREATININE 0.99 08/23/2022   BUN 13 08/23/2022   NA 141 08/23/2022   K 4.3  08/23/2022   CL 105 08/23/2022   CO2 28 08/23/2022   Lab Results  Component Value Date   ALT 23 04/26/2022   AST 17 04/26/2022   ALKPHOS 113 04/26/2022   BILITOT 0.7 04/26/2022   Lab Results  Component Value Date   CHOL 110 04/26/2022   Lab Results  Component Value Date   HDL 39.80 04/26/2022   Lab Results  Component Value Date   LDLCALC 40 04/26/2022   Lab Results  Component Value Date   TRIG 151.0 (H) 04/26/2022   Lab Results  Component Value Date   CHOLHDL 3 04/26/2022   Lab Results  Component Value Date   PSA 1.48 02/01/2019   PSA 1.34 03/18/2016   Lab Results  Component Value Date   HGBA1C 5.7 (A) 08/23/2022   HGBA1C 5.7 08/23/2022   HGBA1C 5.7 08/23/2022   HGBA1C 5.7 08/23/2022   ASSESSMENT AND PLAN:   #1 health maintenance exam: Reviewed age and gender appropriate health maintenance issues (prudent diet, regular exercise, health risks of tobacco and excessive alcohol, use of seatbelts, fire alarms in home, use of sunscreen).  Also reviewed age and gender appropriate health screening as well as vaccine recommendations. Vaccines: All UTD Labs: cmet, tsh,lipids, a1c Prostate ca screening: Per shared decision making process back in 2020 he has decided that he wants no further prostate cancer screening. Colon ca screening: recall as of 04/2021--> patient considering waiting till the beginning of next year to call and arrange this. Lung cancer screening: Patient declined at this time  #2 excessive daytime sleepiness. History of similar response to metoprolol. Will have him stop his bisoprolol for a week to see how he feels. Discussed the possibility of evaluation for obstructive sleep apnea and he is not ready to do this yet.  #3 hypertension, well-controlled on bisoprolol 5 mg a day. As per #2 above we are going to hold this medication for a week.  He will monitor blood pressure and heart rate in the meantime.  4.  Hypercholesterolemia, doing well on  Lipitor 80 mg a day and Zetia 10 mg a day. Lipid panel today.  5.  Prediabetes, doing well on metformin 500 mg a day. Hemoglobin A1c today.  #6 hypothyroidism, doing well on levothyroxine 112 mcg a day. TSH today.  #7 abdominal aortic aneurysm. Detected in 2020.  3-year follow-up ultrasound was recommended. Ordered aortic ultrasound today.  An After Visit Summary was printed and given to the patient.  FOLLOW UP:  No follow-ups on file.  Signed:  Santiago Bumpers, MD           04/12/2023

## 2023-04-12 ENCOUNTER — Ambulatory Visit (INDEPENDENT_AMBULATORY_CARE_PROVIDER_SITE_OTHER): Payer: PPO | Admitting: Family Medicine

## 2023-04-12 ENCOUNTER — Encounter: Payer: Self-pay | Admitting: Family Medicine

## 2023-04-12 VITALS — BP 126/79 | HR 70 | Wt 189.0 lb

## 2023-04-12 DIAGNOSIS — I10 Essential (primary) hypertension: Secondary | ICD-10-CM

## 2023-04-12 DIAGNOSIS — G4719 Other hypersomnia: Secondary | ICD-10-CM

## 2023-04-12 DIAGNOSIS — E039 Hypothyroidism, unspecified: Secondary | ICD-10-CM | POA: Diagnosis not present

## 2023-04-12 DIAGNOSIS — R7303 Prediabetes: Secondary | ICD-10-CM

## 2023-04-12 DIAGNOSIS — E7800 Pure hypercholesterolemia, unspecified: Secondary | ICD-10-CM | POA: Diagnosis not present

## 2023-04-12 DIAGNOSIS — Z0000 Encounter for general adult medical examination without abnormal findings: Secondary | ICD-10-CM | POA: Diagnosis not present

## 2023-04-12 DIAGNOSIS — I714 Abdominal aortic aneurysm, without rupture, unspecified: Secondary | ICD-10-CM | POA: Diagnosis not present

## 2023-04-12 LAB — LIPID PANEL
Cholesterol: 107 mg/dL (ref 0–200)
HDL: 43.1 mg/dL (ref >39.00)
LDL Cholesterol: 35 mg/dL (ref 0–99)
NonHDL: 63.65
Total CHOL/HDL Ratio: 2
Triglycerides: 142 mg/dL (ref 0.0–149.0)
VLDL: 28.4 mg/dL (ref 0.0–40.0)

## 2023-04-12 LAB — COMPREHENSIVE METABOLIC PANEL
ALT: 19 U/L (ref 0–53)
AST: 16 U/L (ref 0–37)
Albumin: 4.4 g/dL (ref 3.5–5.2)
Alkaline Phosphatase: 119 U/L — ABNORMAL HIGH (ref 39–117)
BUN: 15 mg/dL (ref 6–23)
CO2: 30 meq/L (ref 19–32)
Calcium: 9.7 mg/dL (ref 8.4–10.5)
Chloride: 104 meq/L (ref 96–112)
Creatinine, Ser: 0.93 mg/dL (ref 0.40–1.50)
GFR: 81.12 mL/min (ref >60.00)
Glucose, Bld: 81 mg/dL (ref 70–99)
Potassium: 4.8 meq/L (ref 3.5–5.1)
Sodium: 142 meq/L (ref 135–145)
Total Bilirubin: 0.7 mg/dL (ref 0.2–1.2)
Total Protein: 6.8 g/dL (ref 6.0–8.3)

## 2023-04-12 LAB — TSH: TSH: 0.81 u[IU]/mL (ref 0.35–5.50)

## 2023-04-12 LAB — HEMOGLOBIN A1C: Hgb A1c MFr Bld: 6.4 % (ref 4.6–6.5)

## 2023-04-12 MED ORDER — METFORMIN HCL 500 MG PO TABS: 500.0000 mg | ORAL_TABLET | Freq: Every day | ORAL | 3 refills | Status: DC

## 2023-04-12 MED ORDER — LEVOTHYROXINE SODIUM 112 MCG PO TABS: 112.0000 ug | ORAL_TABLET | Freq: Every day | ORAL | 3 refills | Status: DC

## 2023-04-12 MED ORDER — MONTELUKAST SODIUM 10 MG PO TABS: ORAL_TABLET | ORAL | 3 refills | Status: DC

## 2023-04-12 NOTE — Patient Instructions (Signed)
Do not take bisoprolol for 1 week. Check your blood pressure and heart rate 4-5 times in the next 1 week and write these numbers down to review with me in 1 wk.

## 2023-04-13 ENCOUNTER — Other Ambulatory Visit: Payer: Self-pay | Admitting: Family Medicine

## 2023-04-13 ENCOUNTER — Encounter: Payer: Self-pay | Admitting: Family Medicine

## 2023-04-13 MED ORDER — METFORMIN HCL 500 MG PO TABS: ORAL_TABLET | ORAL | 3 refills | Status: DC

## 2023-04-19 ENCOUNTER — Ambulatory Visit: Payer: PPO | Admitting: Family Medicine

## 2023-04-19 ENCOUNTER — Encounter: Payer: Self-pay | Admitting: Family Medicine

## 2023-04-19 VITALS — BP 96/64 | HR 80 | Wt 188.2 lb

## 2023-04-19 DIAGNOSIS — R5383 Other fatigue: Secondary | ICD-10-CM | POA: Diagnosis not present

## 2023-04-19 DIAGNOSIS — G4719 Other hypersomnia: Secondary | ICD-10-CM | POA: Diagnosis not present

## 2023-04-19 DIAGNOSIS — T50905D Adverse effect of unspecified drugs, medicaments and biological substances, subsequent encounter: Secondary | ICD-10-CM | POA: Diagnosis not present

## 2023-04-19 NOTE — Progress Notes (Signed)
OFFICE VISIT  04/19/2023  CC:  Chief Complaint  Patient presents with   Fatigue    Patient is a 73 y.o. male who presents for 1 week follow-up excessive daytime sleepiness/fatigue. A/P as of last visit: "#1 excessive daytime sleepiness. History of similar response to metoprolol. Will have him stop his bisoprolol for a week to see how he feels. Discussed the possibility of evaluation for obstructive sleep apnea and he is not ready to do this yet.   #2 hypertension, well-controlled on bisoprolol 5 mg a day. As per #2 above we are going to hold this medication for a week.  He will monitor blood pressure and heart rate in the meantime."  INTERIM HX: Last visit his A1c had risen from 5.7 to 6.4%.  We increased his metformin from 500 once a day to 500 twice daily.   BP 108/58 to 125/80 at home.  He feels a little better regarding his energy level and excessive sleepiness in the daytime since stopping the bisoprolol.  Past Medical History:  Diagnosis Date   Allergy    Aortic root enlargement (HCC)    CTA chest/aorta 04/2017:  Mild enlargement of aortic root, measuring 3.9 x 3.8 x 3.6 cm.   Arthralgia of both knees 09/2015   Plain films with minimal DJD changes medial compartment, o/w nl.   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  Rpt u/s 3 yrs.   Fatigue due to treatment    better when lopressor dose was decreased to 12.5mg  bid 04/2017.   Hearing loss    Bilat sensorineural; [facility] ENT eval 07/2019; pt candidate for cochlear implants   History of adenomatous polyp of colon 05/19/2016   Recall 04/2021 (Dr. Adela Lank)   Hyperlipidemia    Hypothyroidism    Nephrolithiasis    passed one stone approx 2008; no prob since (saw urologist briefly). UC visit 06/2020 R flank pain, stone suspected.   Normal nuclear stress test 2012    Prediabetes 02/2016   HbA1c 6.1%.  2019 A1c 6.3%. 2022 A1c 6.3%. Jan 2025 a1c 6.4%.   Prostatitis    When pt in 30s and 40s; saw Dr. Patsi Sears and eventually got a TURP per pt's description.  No probs since then.   Recurrent sinusitis    Sinus plain films 08/2014: bilat maxillary sinusitis (chronic)   S/P coronary artery stent placement 2005   RCA and LCX.  As of 02/2018 cardiology f/u, plan is to get ETT (pt doing fine, but was not having chest pain prior to last stent placement).   Tobacco dependence    current as of 01/2019    Past Surgical History:  Procedure Laterality Date   Aortic ultrasound  09/07/2018   Ectatic abd aorta (3.4 cm max diameter) but no aneurism.  Rpt u/s 3 yrs.   APPENDECTOMY     CARDIAC CATHETERIZATION  04/16/2003   NORMAL. EF 60%. TOTALLY OCCLUDED LEFT CIRCUMFLEX WITH SEVERE DISEASE, AND ULCERATED PLAQUE IN THE PROXIMAL RIGHT CORONARY ARTERY AND MILD ATHERSCLEROSIS IN THE LEFT ANTERIOR DESCENDING   CARDIOVASCULAR STRESS TEST  2009;2012; 10/2014; 03/2018   2012 normal nuclear stress test.  2016 ETT no ischemia.  ETT normal 03/2018.   COLONOSCOPY W/ POLYPECTOMY  05/19/2016   Multiple polyps--tubular adenoma.  Recall 5 yrs (04/2021).   CORONARY STENT  PLACEMENT  04/19/2003   STENT PLACEMENT IN THE LEFT CIRCUMFLEX CORONARY AND RIGHT CORONARY ARTERY. MILD RESIDUAL STENOSIS IN THE MID PORTION OF THE LEFT ANTERIOR DESCENDING   TRANSTHORACIC ECHOCARDIOGRAM  05/03/2017   EF 60-65%, normal valves, mildly enlarged aortic root.  f/u CT angio showed no signif aortic root enlargement    Outpatient Medications Prior to Visit  Medication Sig Dispense Refill   aspirin 81 MG tablet Take 81 mg by mouth daily.     atorvastatin (LIPITOR) 80 MG tablet TAKE 1 TABLET BY MOUTH EVERY DAY 90 tablet 3   bisoprolol (ZEBETA) 5 MG tablet Take 1 tablet (5 mg total) by mouth daily. (Patient taking differently: Take 5 mg by mouth every evening.) 90 tablet 3   Cyanocobalamin (VITAMIN B-12 PO) Take  1 tablet by mouth daily.     ezetimibe (ZETIA) 10 MG tablet Take 1 tablet (10 mg total) by mouth daily. 90 tablet 3   fluticasone (FLONASE) 50 MCG/ACT nasal spray Place 2 sprays into both nostrils daily. 48 g 3   levothyroxine (SYNTHROID) 112 MCG tablet Take 1 tablet (112 mcg total) by mouth daily. 90 tablet 3   metFORMIN (GLUCOPHAGE) 500 MG tablet 1 tab po bid 180 tablet 3   montelukast (SINGULAIR) 10 MG tablet TAKE 1 TABLET BY MOUTH EVERYDAY AT BEDTIME 90 tablet 3   No facility-administered medications prior to visit.    Allergies  Allergen Reactions   Plavix [Clopidogrel Bisulfate] Rash    Review of Systems As per HPI  PE:    04/19/2023    8:36 AM 04/12/2023    8:58 AM 12/08/2022    3:11 PM  Vitals with BMI  Weight 188 lbs 3 oz 189 lbs 183 lbs  Systolic 96 126   Diastolic 64 79   Pulse 80 70      Physical Exam  Gen: Alert, well appearing.  Patient is oriented to person, place, time, and situation. AFFECT: pleasant, lucid thought and speech. CV: RRR, no m/r/g.   LUNGS: CTA bilat, nonlabored resps, good aeration in all lung fields. EXT: no edema  LABS:  Last CBC Lab Results  Component Value Date   WBC 8.4 10/28/2021   HGB 16.0 10/28/2021   HCT 47.4 10/28/2021   MCV 86.9 10/28/2021   MCH 29.2 06/06/2019   RDW 14.6 10/28/2021   PLT 167.0 10/28/2021   Last metabolic panel Lab Results  Component Value Date   GLUCOSE 81 04/12/2023   NA 142 04/12/2023   K 4.8 04/12/2023   CL 104 04/12/2023   CO2 30 04/12/2023   BUN 15 04/12/2023   CREATININE 0.93 04/12/2023   GFR 81.12 04/12/2023   CALCIUM 9.7 04/12/2023   PROT 6.8 04/12/2023   ALBUMIN 4.4 04/12/2023   BILITOT 0.7 04/12/2023   ALKPHOS 119 (H) 04/12/2023   AST 16 04/12/2023   ALT 19 04/12/2023   Last lipids Lab Results  Component Value Date   CHOL 107 04/12/2023   HDL 43.10 04/12/2023   LDLCALC 35 04/12/2023   TRIG 142.0 04/12/2023   CHOLHDL 2 04/12/2023   Last hemoglobin A1c Lab Results   Component Value Date   HGBA1C 6.4 04/12/2023   Last thyroid functions Lab Results  Component Value Date   TSH 0.81 04/12/2023   T3TOTAL 89.0 03/19/2016   IMPRESSION AND PLAN:  #1 excessive daytime sleepiness/fatigue. History of having similar symptoms on metoprolol in the past. For the last week off of bisoprolol he has started to feel better.  Will  give this another couple weeks and if we are not convinced that bisoprolol was the culprit then working up pursue evaluation for obstructive sleep apnea.  2.  Prediabetes.  Hemoglobin A1c had increased from 5.7 to 6.4% at last check a week ago. We increased his metformin to 500 twice daily. He is going to work on limiting simple sugars as well as starches.  An After Visit Summary was printed and given to the patient.  FOLLOW UP: Return in about 2 weeks (around 05/03/2023) for Follow-up fatigue.  Signed:  Santiago Bumpers, MD           04/19/2023

## 2023-04-20 ENCOUNTER — Ambulatory Visit (HOSPITAL_BASED_OUTPATIENT_CLINIC_OR_DEPARTMENT_OTHER)
Admission: RE | Admit: 2023-04-20 | Discharge: 2023-04-20 | Disposition: A | Discharge status: Home / Self Care | Payer: PPO | Source: Ambulatory Visit | Attending: Family Medicine | Admitting: Family Medicine

## 2023-04-20 DIAGNOSIS — I714 Abdominal aortic aneurysm, without rupture, unspecified: Secondary | ICD-10-CM | POA: Insufficient documentation

## 2023-04-21 ENCOUNTER — Encounter: Payer: Self-pay | Admitting: Family Medicine

## 2023-05-05 ENCOUNTER — Other Ambulatory Visit: Payer: Self-pay | Admitting: Family Medicine

## 2023-05-05 NOTE — Patient Instructions (Signed)

## 2023-05-06 ENCOUNTER — Encounter: Payer: Self-pay | Admitting: Family Medicine

## 2023-05-06 ENCOUNTER — Ambulatory Visit: Payer: PPO | Admitting: Family Medicine

## 2023-05-06 VITALS — BP 116/65 | HR 94 | Temp 97.7°F | Ht 71.0 in | Wt 184.4 lb

## 2023-05-06 DIAGNOSIS — R5383 Other fatigue: Secondary | ICD-10-CM | POA: Diagnosis not present

## 2023-05-06 NOTE — Progress Notes (Signed)
OFFICE VISIT  05/06/2023  CC:  Chief Complaint  Patient presents with   Medical Management of Chronic Issues    2 week f/u fatigue    Patient is a 73 y.o. male who presents for 2-week follow-up fatigue. A/P as of last visit: "#1 Excessive daytime sleepiness/fatigue. History of having similar symptoms on metoprolol in the past. For the last week off of bisoprolol he has started to feel better.  Will give this another couple weeks and if we are not convinced that bisoprolol was the culprit then working up pursue evaluation for obstructive sleep apnea.   2.  Prediabetes.  Hemoglobin A1c had increased from 5.7 to 6.4% at last check a week ago. We increased his metformin to 500 twice daily. He is going to work on limiting simple sugars as well as starches."  INTERIM HX: Feeling a little bit better regarding his fatigue and sleepiness.  Unfortunately, his 9 year old cat died.  He has no cough, shortness of breath, or chest pain. No abnormal weight loss.  Past Medical History:  Diagnosis Date   Allergy    Aortic root enlargement (HCC)    CTA chest/aorta 04/2017:  Mild enlargement of aortic root, measuring 3.9 x 3.8 x 3.6 cm.   Arthralgia of both knees 09/2015   Plain films with minimal DJD changes medial compartment, o/w nl.   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  Rpt u/s 3 yrs.   Fatigue due to treatment    better when lopressor dose was decreased to 12.5mg  bid 04/2017.   Hearing loss    Bilat sensorineural; [facility] ENT eval 07/2019; pt candidate for cochlear implants   History of adenomatous polyp of colon 05/19/2016   Recall 04/2021 (Dr. Adela Lank)   Hyperlipidemia    Hypothyroidism    Nephrolithiasis    passed one stone approx 2008; no prob since (saw urologist briefly). UC visit 06/2020 R flank pain, stone suspected.    Normal nuclear stress test 2012   Prediabetes 02/2016   HbA1c 6.1%.  2019 A1c 6.3%. 2022 A1c 6.3%. Jan 2025 a1c 6.4%.   Prostatitis    When pt in 30s and 40s; saw Dr. Patsi Sears and eventually got a TURP per pt's description.  No probs since then.   Recurrent sinusitis    Sinus plain films 08/2014: bilat maxillary sinusitis (chronic)   S/P coronary artery stent placement 2005   RCA and LCX.  As of 02/2018 cardiology f/u, plan is to get ETT (pt doing fine, but was not having chest pain prior to last stent placement).   Tobacco dependence    current as of 01/2019    Past Surgical History:  Procedure Laterality Date   Aortic ultrasound  09/07/2018   Ectatic abd aorta (3.4 cm max diameter) but no aneurism.  Small amount of interval growth on follow-up ultrasound 03/2023---> repeat aortic ultrasound 3 years   APPENDECTOMY     CARDIAC CATHETERIZATION  04/16/2003   NORMAL. EF 60%. TOTALLY OCCLUDED LEFT CIRCUMFLEX WITH SEVERE DISEASE, AND ULCERATED PLAQUE IN THE PROXIMAL RIGHT CORONARY ARTERY AND MILD ATHERSCLEROSIS IN THE LEFT ANTERIOR DESCENDING   CARDIOVASCULAR STRESS TEST  2009;2012; 10/2014; 03/2018   2012 normal nuclear stress test.  2016 ETT no ischemia.  ETT normal 03/2018.   COLONOSCOPY W/ POLYPECTOMY  05/19/2016  Multiple polyps--tubular adenoma.  Recall 5 yrs (04/2021).   CORONARY STENT PLACEMENT  04/19/2003   STENT PLACEMENT IN THE LEFT CIRCUMFLEX CORONARY AND RIGHT CORONARY ARTERY. MILD RESIDUAL STENOSIS IN THE MID PORTION OF THE LEFT ANTERIOR DESCENDING   TRANSTHORACIC ECHOCARDIOGRAM  05/03/2017   EF 60-65%, normal valves, mildly enlarged aortic root.  f/u CT angio showed no signif aortic root enlargement    Outpatient Medications Prior to Visit  Medication Sig Dispense Refill   aspirin 81 MG tablet Take 81 mg by mouth daily.     atorvastatin (LIPITOR) 80 MG tablet TAKE 1 TABLET BY MOUTH EVERY DAY 90 tablet 3   bisoprolol (ZEBETA) 5 MG tablet Take 1 tablet (5 mg total) by mouth  daily. (Patient taking differently: Take 5 mg by mouth every evening.) 90 tablet 3   Cyanocobalamin (VITAMIN B-12 PO) Take 1 tablet by mouth daily.     ezetimibe (ZETIA) 10 MG tablet Take 1 tablet (10 mg total) by mouth daily. 90 tablet 3   fluticasone (FLONASE) 50 MCG/ACT nasal spray Place 2 sprays into both nostrils daily. 48 g 3   levothyroxine (SYNTHROID) 112 MCG tablet Take 1 tablet (112 mcg total) by mouth daily. 90 tablet 3   metFORMIN (GLUCOPHAGE) 500 MG tablet 1 tab po bid 180 tablet 3   montelukast (SINGULAIR) 10 MG tablet TAKE 1 TABLET BY MOUTH EVERYDAY AT BEDTIME 90 tablet 3   No facility-administered medications prior to visit.    Allergies  Allergen Reactions   Plavix [Clopidogrel Bisulfate] Rash    Review of Systems As per HPI  PE:    05/06/2023    8:16 AM 04/19/2023    8:36 AM 04/12/2023    8:58 AM  Vitals with BMI  Height 5\' 11"     Weight 184 lbs 6 oz 188 lbs 3 oz 189 lbs  BMI 25.73    Systolic 116 96 126  Diastolic 65 64 79  Pulse 94 80 70     Physical Exam  Gen: Alert, well appearing.  Patient is oriented to person, place, time, and situation. AFFECT: pleasant, lucid thought and speech. No further exam today  LABS:  Last CBC Lab Results  Component Value Date   WBC 8.4 10/28/2021   HGB 16.0 10/28/2021   HCT 47.4 10/28/2021   MCV 86.9 10/28/2021   MCH 29.2 06/06/2019   RDW 14.6 10/28/2021   PLT 167.0 10/28/2021   Last metabolic panel Lab Results  Component Value Date   GLUCOSE 81 04/12/2023   NA 142 04/12/2023   K 4.8 04/12/2023   CL 104 04/12/2023   CO2 30 04/12/2023   BUN 15 04/12/2023   CREATININE 0.93 04/12/2023   GFR 81.12 04/12/2023   CALCIUM 9.7 04/12/2023   PROT 6.8 04/12/2023   ALBUMIN 4.4 04/12/2023   BILITOT 0.7 04/12/2023   ALKPHOS 119 (H) 04/12/2023   AST 16 04/12/2023   ALT 19 04/12/2023   Last lipids Lab Results  Component Value Date   CHOL 107 04/12/2023   HDL 43.10 04/12/2023   LDLCALC 35 04/12/2023   TRIG  142.0 04/12/2023   CHOLHDL 2 04/12/2023   Last hemoglobin A1c Lab Results  Component Value Date   HGBA1C 6.4 04/12/2023   Last thyroid functions Lab Results  Component Value Date   TSH 0.81 04/12/2023   T3TOTAL 89.0 03/19/2016   IMPRESSION AND PLAN:  #1 chronic fatigue/excessive sleepiness. He feels like being off of bisoprolol has been helping gradually but he has been off  it several weeks and I would have expected the rebound and energy to be much more brisk if this medication was the cause.  He will restart it and see how it makes him feel for a few days. We deferred further evaluation for sleep apnea for now.  Next A1c after 07/11/2023  An After Visit Summary was printed and given to the patient.  FOLLOW UP: Return in about 10 weeks (around 07/15/2023) for RCI.  Signed:  Santiago Bumpers, MD           05/06/2023

## 2023-05-16 ENCOUNTER — Other Ambulatory Visit: Payer: Self-pay | Admitting: Family Medicine

## 2023-05-16 NOTE — Telephone Encounter (Signed)
 Filled 04/27/23, receipt confirmed by pharmacy. No further action needed.

## 2023-05-16 NOTE — Telephone Encounter (Signed)
 Copied from CRM [id number]. Topic: Clinical - Medication Refill >> May 16, 2023 10:37 AM Theodis Sato wrote: Most Recent Primary Care Visit:  Provider: Jeoffrey Massed  Department: [facility]  Visit Type: OFFICE VISIT  Date: 05/06/2023  Medication: ezetimibe (ZETIA) 10 MG tablet   Has the patient contacted their pharmacy? Yes,but current prescription is expired.  (Agent: If no, request that the patient contact the pharmacy for the refill. If patient does not wish to contact the pharmacy document the reason why and proceed with request.) (Agent: If yes, when and what did the pharmacy advise?)  Is this the correct pharmacy for this prescription? Yes If no, delete pharmacy and type the correct one.  This is the patient's preferred pharmacy:  [id number] - [place], [place] - [address] [place] [address] Phone: [phone] Fax: [phone]   Has the prescription been filled recently? Yes  Is the patient out of the medication? No (has 3 left)  Has the patient been seen for an appointment in the last year OR does the patient have an upcoming appointment? Yes  Can we respond through MyChart? No  Agent: Please be advised that Rx refills may take up to 3 business days. We ask that you follow-up with your pharmacy.

## 2023-05-23 ENCOUNTER — Other Ambulatory Visit: Payer: Self-pay | Admitting: Family Medicine

## 2023-05-23 DIAGNOSIS — L820 Inflamed seborrheic keratosis: Secondary | ICD-10-CM | POA: Diagnosis not present

## 2023-05-23 DIAGNOSIS — L570 Actinic keratosis: Secondary | ICD-10-CM | POA: Diagnosis not present

## 2023-05-23 DIAGNOSIS — L814 Other melanin hyperpigmentation: Secondary | ICD-10-CM | POA: Diagnosis not present

## 2023-05-23 DIAGNOSIS — L578 Other skin changes due to chronic exposure to nonionizing radiation: Secondary | ICD-10-CM | POA: Diagnosis not present

## 2023-05-23 DIAGNOSIS — D1801 Hemangioma of skin and subcutaneous tissue: Secondary | ICD-10-CM | POA: Diagnosis not present

## 2023-05-23 DIAGNOSIS — D485 Neoplasm of uncertain behavior of skin: Secondary | ICD-10-CM | POA: Diagnosis not present

## 2023-05-23 DIAGNOSIS — L986 Other infiltrative disorders of the skin and subcutaneous tissue: Secondary | ICD-10-CM | POA: Diagnosis not present

## 2023-05-23 DIAGNOSIS — L821 Other seborrheic keratosis: Secondary | ICD-10-CM | POA: Diagnosis not present

## 2023-05-23 DIAGNOSIS — D229 Melanocytic nevi, unspecified: Secondary | ICD-10-CM | POA: Diagnosis not present

## 2023-05-23 MED ORDER — EZETIMIBE 10 MG PO TABS: 10.0000 mg | ORAL_TABLET | Freq: Every day | ORAL | 0 refills | Status: DC

## 2023-05-23 NOTE — Telephone Encounter (Signed)
 Copied from CRM [id number]. Topic: Clinical - Medication Refill >> May 23, 2023 10:57 AM [place] J wrote: Most Recent Primary Care Visit:  Provider: Jeoffrey Massed  Department: [facility]  Visit Type: OFFICE VISIT  Date: 05/06/2023  Medication: metFORMIN (GLUCOPHAGE) 500 MG tablet  Has the patient contacted their pharmacy? Yes (Agent: If no, request that the patient contact the pharmacy for the refill. If patient does not wish to contact the pharmacy document the reason why and proceed with request.) (Agent: If yes, when and what did the pharmacy advise?)  Is this the correct pharmacy for this prescription? Yes If no, delete pharmacy and type the correct one.  This is the patient's preferred pharmacy:  [id number] - [place], [place] - [address] [place] [address] Phone: [phone] Fax: [phone]   Has the prescription been filled recently? No  Is the patient out of the medication? No  Has the patient been seen for an appointment in the last year OR does the patient have an upcoming appointment? Yes  Can we respond through MyChart? Yes  Agent: Please be advised that Rx refills may take up to 3 business days. We ask that you follow-up with your pharmacy.

## 2023-05-28 ENCOUNTER — Other Ambulatory Visit: Payer: Self-pay | Admitting: Family Medicine

## 2023-05-30 ENCOUNTER — Ambulatory Visit: Payer: Self-pay | Admitting: Family Medicine

## 2023-05-30 NOTE — Telephone Encounter (Signed)
 Pt advised new rx was sent at the time of last appt 01/21, he will contact the pharmacy.

## 2023-05-30 NOTE — Telephone Encounter (Signed)
 Copied from CRM [id number]. Topic: Clinical - Prescription Issue >> May 30, 2023  3:10 PM [place] A wrote: Reason for CRM: Patient called because he has been waiting since 05/23/23 for refill on metFORMIN (GLUCOPHAGE) 500 MG tablet; patient said medication is out sooner because doctor told him to take it twice a day

## 2023-07-20 ENCOUNTER — Ambulatory Visit (INDEPENDENT_AMBULATORY_CARE_PROVIDER_SITE_OTHER): Payer: PPO | Admitting: Family Medicine

## 2023-07-20 ENCOUNTER — Encounter: Payer: Self-pay | Admitting: Family Medicine

## 2023-07-20 VITALS — BP 100/62 | HR 85 | Ht 71.0 in | Wt 181.0 lb

## 2023-07-20 DIAGNOSIS — G4719 Other hypersomnia: Secondary | ICD-10-CM

## 2023-07-20 DIAGNOSIS — R7303 Prediabetes: Secondary | ICD-10-CM

## 2023-07-20 LAB — POCT GLYCOSYLATED HEMOGLOBIN (HGB A1C)
HbA1c POC (<> result, manual entry): 5.7 % (ref 4.0–5.6)
HbA1c, POC (controlled diabetic range): 5.7 % (ref 0.0–7.0)
HbA1c, POC (prediabetic range): 5.7 % — AB (ref 5.7–6.4)
Hemoglobin A1C: 5.7 % — AB (ref 4.0–5.6)

## 2023-07-20 NOTE — Progress Notes (Signed)
 OFFICE VISIT  07/20/2023  CC:  Chief Complaint  Patient presents with   Medical Management of Chronic Issues    Pt is fasting    Patient is a 73 y.o. male who presents for 81-month follow-up prediabetes, hyperlipidemia, and chronic fatigue/excessive daytime sleepiness.  INTERIM HX: Doing well overall but does still have some fatigue/excessive daytime sleepiness. He is not sure if it has been better off of the bisoprolol  or not.  He has made some minor dietary changes since last visit. He remains as active as he can be.  .ROS as above, plus--> no fevers, no CP, no SOB, no wheezing, no cough, no dizziness, no HAs, no rashes, no melena/hematochezia.  No polyuria or polydipsia.  No myalgias or arthralgias.  No focal weakness, paresthesias, or tremors.  No acute vision or hearing abnormalities.  No dysuria or unusual/new urinary urgency or frequency.  No recent changes in lower legs. No n/v/d or abd pain.  No palpitations.     Past Medical History:  Diagnosis Date   Allergy    Aortic root enlargement (HCC)    CTA chest/aorta 04/2017:  Mild enlargement of aortic root, measuring 3.9 x 3.8 x 3.6 cm.   Arthralgia of both knees 09/2015   Plain films with minimal DJD changes medial compartment, o/w nl.   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  Rpt u/s 3 yrs.   Fatigue due to treatment    better when lopressor  dose was decreased to 12.5mg  bid 04/2017.   Hearing loss    Bilat sensorineural; [facility] ENT eval 07/2019; pt candidate for cochlear implants   History of adenomatous polyp of colon 05/19/2016   Recall 04/2021 (Dr. General Kenner)   Hyperlipidemia    Hypothyroidism    Nephrolithiasis    passed one stone approx 2008; no prob since (saw urologist briefly). UC visit 06/2020 R flank pain, stone suspected.   Normal nuclear stress test 2012    Prediabetes 02/2016   HbA1c 6.1%.  2019 A1c 6.3%. 2022 A1c 6.3%. Jan 2025 a1c 6.4%.   Prostatitis    When pt in 30s and 40s; saw Dr. Isla Mari and eventually got a TURP per pt's description.  No probs since then.   Recurrent sinusitis    Sinus plain films 08/2014: bilat maxillary sinusitis (chronic)   S/P coronary artery stent placement 2005   RCA and LCX.  As of 02/2018 cardiology f/u, plan is to get ETT (pt doing fine, but was not having chest pain prior to last stent placement).   Tobacco dependence    current as of 01/2019    Past Surgical History:  Procedure Laterality Date   Aortic ultrasound  09/07/2018   Ectatic abd aorta (3.4 cm max diameter) but no aneurism.  Small amount of interval growth on follow-up ultrasound 03/2023---> repeat aortic ultrasound 3 years   APPENDECTOMY     CARDIAC CATHETERIZATION  04/16/2003   NORMAL. EF 60%. TOTALLY OCCLUDED LEFT CIRCUMFLEX WITH SEVERE DISEASE, AND ULCERATED PLAQUE IN THE PROXIMAL RIGHT CORONARY ARTERY AND MILD ATHERSCLEROSIS IN THE LEFT ANTERIOR DESCENDING   CARDIOVASCULAR STRESS TEST  2009;2012; 10/2014; 03/2018   2012 normal nuclear stress test.  2016 ETT no ischemia.  ETT normal 03/2018.   COLONOSCOPY W/ POLYPECTOMY  05/19/2016   Multiple polyps--tubular adenoma.  Recall 5  yrs (04/2021).   CORONARY STENT PLACEMENT  04/19/2003   STENT PLACEMENT IN THE LEFT CIRCUMFLEX CORONARY AND RIGHT CORONARY ARTERY. MILD RESIDUAL STENOSIS IN THE MID PORTION OF THE LEFT ANTERIOR DESCENDING   TRANSTHORACIC ECHOCARDIOGRAM  05/03/2017   EF 60-65%, normal valves, mildly enlarged aortic root.  f/u CT angio showed no signif aortic root enlargement    Outpatient Medications Prior to Visit  Medication Sig Dispense Refill   aspirin 81 MG tablet Take 81 mg by mouth daily.     atorvastatin  (LIPITOR) 80 MG tablet TAKE 1 TABLET BY MOUTH EVERY DAY 90 tablet 3   bisoprolol  (ZEBETA ) 5 MG tablet TAKE 1 TABLET (5 MG TOTAL) BY MOUTH DAILY. 90 tablet 0    Cyanocobalamin (VITAMIN B-12 PO) Take 1 tablet by mouth daily.     ezetimibe  (ZETIA ) 10 MG tablet Take 1 tablet (10 mg total) by mouth daily. 90 tablet 0   fluticasone  (FLONASE ) 50 MCG/ACT nasal spray Place 2 sprays into both nostrils daily. 48 g 3   levothyroxine  (SYNTHROID ) 112 MCG tablet Take 1 tablet (112 mcg total) by mouth daily. 90 tablet 3   metFORMIN  (GLUCOPHAGE ) 500 MG tablet 1 tab po bid 180 tablet 3   montelukast  (SINGULAIR ) 10 MG tablet TAKE 1 TABLET BY MOUTH EVERYDAY AT BEDTIME 90 tablet 3   No facility-administered medications prior to visit.    Allergies  Allergen Reactions   Plavix [Clopidogrel Bisulfate] Rash    Review of Systems As per HPI  PE:    07/20/2023    9:03 AM 05/06/2023    8:16 AM 04/19/2023    8:36 AM  Vitals with BMI  Height 5\' 11"  5\' 11"    Weight 181 lbs 184 lbs 6 oz 188 lbs 3 oz  BMI 25.26 25.73   Systolic 100 116 96  Diastolic 62 65 64  Pulse 85 94 80     Physical Exam  Gen: Alert, well appearing.  Patient is oriented to person, place, time, and situation. No further exam today  LABS:  Last CBC Lab Results  Component Value Date   WBC 8.4 10/28/2021   HGB 16.0 10/28/2021   HCT 47.4 10/28/2021   MCV 86.9 10/28/2021   MCH 29.2 06/06/2019   RDW 14.6 10/28/2021   PLT 167.0 10/28/2021   Last metabolic panel Lab Results  Component Value Date   GLUCOSE 81 04/12/2023   NA 142 04/12/2023   K 4.8 04/12/2023   CL 104 04/12/2023   CO2 30 04/12/2023   BUN 15 04/12/2023   CREATININE 0.93 04/12/2023   GFR 81.12 04/12/2023   CALCIUM  9.7 04/12/2023   PROT 6.8 04/12/2023   ALBUMIN 4.4 04/12/2023   BILITOT 0.7 04/12/2023   ALKPHOS 119 (H) 04/12/2023   AST 16 04/12/2023   ALT 19 04/12/2023   Last lipids Lab Results  Component Value Date   CHOL 107 04/12/2023   HDL 43.10 04/12/2023   LDLCALC 35 04/12/2023   TRIG 142.0 04/12/2023   CHOLHDL 2 04/12/2023   Last hemoglobin A1c Lab Results  Component Value Date   HGBA1C 6.4  04/12/2023   Last thyroid  functions Lab Results  Component Value Date   TSH 0.81 04/12/2023   T3TOTAL 89.0 03/19/2016   IMPRESSION AND PLAN:  #1 prediabetes. POC Hba1c today is 5.7%. Continue metformin  500 mg twice a day.  #2 hypercholesterolemia, doing well on a atorvastatin  80 mg a day and Zetia  10 mg a day. 3 months ago his LDL was 35. Plan repeat  lipids in 3 months.  #3 fatigue/excessive daytime sleepiness. It is not clear whether being off the bisoprolol  has made much difference. He will go ahead and restart it at the 2.5 mg dose and we will see if any symptoms get worse. If no significant change in 3 months we will initiate referral for evaluation of obstructive sleep apnea.  Serum creatinine, electrolytes, and hepatic panel have all been stable repeatedly, most recently 3 months ago.  Will defer these labs until next follow-up in 3 months.  An After Visit Summary was printed and given to the patient.  FOLLOW UP: No follow-ups on file.  Signed:  Arletha Lady, MD           07/20/2023

## 2023-08-03 ENCOUNTER — Other Ambulatory Visit: Payer: Self-pay | Admitting: Cardiovascular Disease

## 2023-08-03 DIAGNOSIS — E7800 Pure hypercholesterolemia, unspecified: Secondary | ICD-10-CM

## 2023-08-18 ENCOUNTER — Other Ambulatory Visit: Payer: Self-pay | Admitting: Family Medicine

## 2023-08-27 ENCOUNTER — Other Ambulatory Visit: Payer: Self-pay | Admitting: Cardiovascular Disease

## 2023-08-27 DIAGNOSIS — E7800 Pure hypercholesterolemia, unspecified: Secondary | ICD-10-CM

## 2023-08-30 ENCOUNTER — Other Ambulatory Visit: Payer: Self-pay | Admitting: Cardiovascular Disease

## 2023-08-30 DIAGNOSIS — E7800 Pure hypercholesterolemia, unspecified: Secondary | ICD-10-CM

## 2023-09-08 ENCOUNTER — Other Ambulatory Visit: Payer: Self-pay | Admitting: Cardiovascular Disease

## 2023-09-08 DIAGNOSIS — E7800 Pure hypercholesterolemia, unspecified: Secondary | ICD-10-CM

## 2023-09-09 NOTE — Telephone Encounter (Signed)
 Pt of Dr. Abel Hoe please advise on this RX refill.

## 2023-09-11 ENCOUNTER — Other Ambulatory Visit: Payer: Self-pay | Admitting: Family Medicine

## 2023-10-14 ENCOUNTER — Other Ambulatory Visit: Payer: Self-pay | Admitting: Family Medicine

## 2023-10-19 ENCOUNTER — Ambulatory Visit (INDEPENDENT_AMBULATORY_CARE_PROVIDER_SITE_OTHER): Admitting: Family Medicine

## 2023-10-19 ENCOUNTER — Encounter: Payer: Self-pay | Admitting: Family Medicine

## 2023-10-19 VITALS — BP 93/51 | HR 61 | Temp 98.0°F | Ht 71.0 in | Wt 175.2 lb

## 2023-10-19 DIAGNOSIS — G4719 Other hypersomnia: Secondary | ICD-10-CM | POA: Diagnosis not present

## 2023-10-19 DIAGNOSIS — R7303 Prediabetes: Secondary | ICD-10-CM

## 2023-10-19 DIAGNOSIS — I10 Essential (primary) hypertension: Secondary | ICD-10-CM

## 2023-10-19 DIAGNOSIS — I952 Hypotension due to drugs: Secondary | ICD-10-CM

## 2023-10-19 DIAGNOSIS — E7800 Pure hypercholesterolemia, unspecified: Secondary | ICD-10-CM | POA: Diagnosis not present

## 2023-10-19 LAB — LIPID PANEL
Cholesterol: 107 mg/dL (ref 0–200)
HDL: 39.1 mg/dL (ref >39.00)
LDL Cholesterol: 48 mg/dL (ref 0–99)
NonHDL: 68.06
Total CHOL/HDL Ratio: 3
Triglycerides: 99 mg/dL (ref 0.0–149.0)
VLDL: 19.8 mg/dL (ref 0.0–40.0)

## 2023-10-19 LAB — COMPREHENSIVE METABOLIC PANEL WITH GFR
ALT: 22 U/L (ref 0–53)
AST: 19 U/L (ref 0–37)
Albumin: 4.3 g/dL (ref 3.5–5.2)
Alkaline Phosphatase: 109 U/L (ref 39–117)
BUN: 14 mg/dL (ref 6–23)
CO2: 30 meq/L (ref 19–32)
Calcium: 9.5 mg/dL (ref 8.4–10.5)
Chloride: 102 meq/L (ref 96–112)
Creatinine, Ser: 0.91 mg/dL (ref 0.40–1.50)
GFR: 82.96 mL/min (ref >60.00)
Glucose, Bld: 90 mg/dL (ref 70–99)
Potassium: 4.3 meq/L (ref 3.5–5.1)
Sodium: 140 meq/L (ref 135–145)
Total Bilirubin: 1 mg/dL (ref 0.2–1.2)
Total Protein: 6.6 g/dL (ref 6.0–8.3)

## 2023-10-19 LAB — POCT GLYCOSYLATED HEMOGLOBIN (HGB A1C)
HbA1c POC (<> result, manual entry): 5.7 % (ref 4.0–5.6)
HbA1c, POC (controlled diabetic range): 5.7 % (ref 0.0–7.0)
HbA1c, POC (prediabetic range): 5.7 % (ref 5.7–6.4)
Hemoglobin A1C: 5.7 % — AB (ref 4.0–5.6)

## 2023-10-19 MED ORDER — EZETIMIBE 10 MG PO TABS: 10.0000 mg | ORAL_TABLET | Freq: Every day | ORAL | 1 refills | Status: DC

## 2023-10-19 MED ORDER — BISOPROLOL FUMARATE 5 MG PO TABS: 5.0000 mg | ORAL_TABLET | Freq: Every day | ORAL | 0 refills | Status: DC

## 2023-10-19 NOTE — Patient Instructions (Addendum)
 Stop bisoprolol  (zabeta). Monitor your blood pressure and heart rate can daily for the next 1 week and send me a message through MyChart.

## 2023-10-19 NOTE — Progress Notes (Signed)
 OFFICE VISIT  10/19/2023  CC:  Chief Complaint  Patient presents with   Medical Management of Chronic Issues    Pt is fasting    Patient is a 73 y.o. male who presents for 30-month follow-up fatigue/excessive daytime sleepiness, hypertension, prediabetes, hyperlipidemia, and hypothyroidism. A/P as of last visit: #1 chronic fatigue/excessive sleepiness. He feels like being off of bisoprolol  has been helping gradually but he has been off it several weeks and I would have expected the rebound and energy to be much more brisk if this medication was the cause.  He will restart it and see how it makes him feel for a few days. We deferred further evaluation for sleep apnea for now. Next A1c after 07/11/2023  INTERIM HX: He is doing okay. He admits he still feels very tired most days.  He tried going off bisoprolol  completely for a couple weeks but he is not sure he felt any different.  He is taking it again now.  He does not monitor blood pressure at home but he does have a cuff.  ROS as above, plus--> no fevers, no CP, no SOB, no wheezing, no cough, no dizziness, no HAs, no rashes, no melena/hematochezia.  No polyuria or polydipsia.  No myalgias or arthralgias.  No focal weakness, paresthesias, or tremors.  No acute vision or hearing abnormalities.  No dysuria or unusual/new urinary urgency or frequency.  No recent changes in lower legs. No n/v/d or abd pain.  No palpitations.     Past Medical History:  Diagnosis Date   Allergy    Aortic root enlargement (HCC)    CTA chest/aorta 04/2017:  Mild enlargement of aortic root, measuring 3.9 x 3.8 x 3.6 cm.   Arthralgia of both knees 09/2015   Plain films with minimal DJD changes medial compartment, o/w nl.   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  Rpt u/s 3 yrs.   Fatigue due to  treatment    better when lopressor  dose was decreased to 12.5mg  bid 04/2017.   Hearing loss    Bilat sensorineural; [facility] ENT eval 07/2019; pt candidate for cochlear implants   History of adenomatous polyp of colon 05/19/2016   Recall 04/2021 (Dr. Leigh)   Hyperlipidemia    Hypothyroidism    Nephrolithiasis    passed one stone approx 2008; no prob since (saw urologist briefly). UC visit 06/2020 R flank pain, stone suspected.   Normal nuclear stress test 2012   Prediabetes 02/2016   HbA1c 6.1%.  2019 A1c 6.3%. 2022 A1c 6.3%. Jan 2025 a1c 6.4%.   Prostatitis    When pt in 30s and 40s; saw Dr. Chales and eventually got a TURP per pt's description.  No probs since then.   Recurrent sinusitis    Sinus plain films 08/2014: bilat maxillary sinusitis (chronic)   S/P coronary artery stent placement 2005   RCA and LCX.  As of 02/2018 cardiology f/u, plan is to get ETT (pt doing fine, but was not having chest pain prior to last stent placement).   Tobacco dependence    current as of 01/2019    Past Surgical History:  Procedure Laterality Date   Aortic ultrasound  09/07/2018   Ectatic abd aorta (3.4 cm max diameter) but no aneurism.  Small amount of interval growth on  follow-up ultrasound 03/2023---> repeat aortic ultrasound 3 years   APPENDECTOMY     CARDIAC CATHETERIZATION  04/16/2003   NORMAL. EF 60%. TOTALLY OCCLUDED LEFT CIRCUMFLEX WITH SEVERE DISEASE, AND ULCERATED PLAQUE IN THE PROXIMAL RIGHT CORONARY ARTERY AND MILD ATHERSCLEROSIS IN THE LEFT ANTERIOR DESCENDING   CARDIOVASCULAR STRESS TEST  2009;2012; 10/2014; 03/2018   2012 normal nuclear stress test.  2016 ETT no ischemia.  ETT normal 03/2018.   COLONOSCOPY W/ POLYPECTOMY  05/19/2016   Multiple polyps--tubular adenoma.  Recall 5 yrs (04/2021).   CORONARY STENT PLACEMENT  04/19/2003   STENT PLACEMENT IN THE LEFT CIRCUMFLEX CORONARY AND RIGHT CORONARY ARTERY. MILD RESIDUAL STENOSIS IN THE MID PORTION OF THE LEFT ANTERIOR DESCENDING    TRANSTHORACIC ECHOCARDIOGRAM  05/03/2017   EF 60-65%, normal valves, mildly enlarged aortic root.  f/u CT angio showed no signif aortic root enlargement    Outpatient Medications Prior to Visit  Medication Sig Dispense Refill   aspirin 81 MG tablet Take 81 mg by mouth daily.     atorvastatin  (LIPITOR) 80 MG tablet TAKE 1 TABLET BY MOUTH EVERY DAY 90 tablet 0   Cyanocobalamin (VITAMIN B-12 PO) Take 1 tablet by mouth daily.     fluticasone  (FLONASE ) 50 MCG/ACT nasal spray Place 2 sprays into both nostrils daily. 48 g 3   levothyroxine  (SYNTHROID ) 112 MCG tablet Take 1 tablet (112 mcg total) by mouth daily. 90 tablet 3   metFORMIN  (GLUCOPHAGE ) 500 MG tablet 1 tab po bid 180 tablet 3   montelukast  (SINGULAIR ) 10 MG tablet TAKE 1 TABLET BY MOUTH EVERYDAY AT BEDTIME 90 tablet 3   bisoprolol  (ZEBETA ) 5 MG tablet TAKE 1 TABLET (5 MG TOTAL) BY MOUTH DAILY. 90 tablet 0   ezetimibe  (ZETIA ) 10 MG tablet TAKE 1 TABLET BY MOUTH EVERY DAY 30 tablet 1   No facility-administered medications prior to visit.    Allergies  Allergen Reactions   Plavix [Clopidogrel Bisulfate] Rash    Review of Systems As per HPI  PE:    10/19/2023    8:42 AM 07/20/2023    9:03 AM 05/06/2023    8:16 AM  Vitals with BMI  Height 5' 11 5' 11 5' 11  Weight 175 lbs 3 oz 181 lbs 184 lbs 6 oz  BMI 24.45 25.26 25.73  Systolic 93 100 116  Diastolic 51 62 65  Pulse 61 85 94     Physical Exam  Gen: Alert, well appearing.  Patient is oriented to person, place, time, and situation. AFFECT: pleasant, lucid thought and speech. CV: RRR, no m/r/g.   LUNGS: CTA bilat, nonlabored resps, good aeration in all lung fields. EXT: no clubbing or cyanosis.  no edema.    LABS:  Last CBC Lab Results  Component Value Date   WBC 8.4 10/28/2021   HGB 16.0 10/28/2021   HCT 47.4 10/28/2021   MCV 86.9 10/28/2021   MCH 29.2 06/06/2019   RDW 14.6 10/28/2021   PLT 167.0 10/28/2021   Last metabolic panel Lab Results  Component  Value Date   GLUCOSE 81 04/12/2023   NA 142 04/12/2023   K 4.8 04/12/2023   CL 104 04/12/2023   CO2 30 04/12/2023   BUN 15 04/12/2023   CREATININE 0.93 04/12/2023   GFR 81.12 04/12/2023   CALCIUM  9.7 04/12/2023   PROT 6.8 04/12/2023   ALBUMIN 4.4 04/12/2023   BILITOT 0.7 04/12/2023   ALKPHOS 119 (H) 04/12/2023   AST 16 04/12/2023   ALT 19 04/12/2023   Last  lipids Lab Results  Component Value Date   CHOL 107 04/12/2023   HDL 43.10 04/12/2023   LDLCALC 35 04/12/2023   TRIG 142.0 04/12/2023   CHOLHDL 2 04/12/2023   Last hemoglobin A1c Lab Results  Component Value Date   HGBA1C 5.7 (A) 10/19/2023   HGBA1C 5.7 10/19/2023   HGBA1C 5.7 10/19/2023   HGBA1C 5.7 10/19/2023   Last thyroid  functions Lab Results  Component Value Date   TSH 0.81 04/12/2023   T3TOTAL 89.0 03/19/2016   IMPRESSION AND PLAN:  #1 prediabetes.  Doing great on metformin  500 mg twice daily. POC Hba1c today is 5.7%.  #2 hypercholesterolemia, doing well on atorvastatin  80 mg a day and Zetia  10 mg a day. Monitor lipids and hepatic panel today.  3.  Hypertension, history of.  He is on bisoprolol  5 mg a day. Looking back in the EMR it appears his blood pressure has been down similar to today's (90s over 60s) on multiple occasions.  The highest it has been is 126 systolic.  We decided to discontinue bisoprolol  today and see how his blood pressures are and see if he feels less fatigued.  #4 hypothyroidism, stable on 112 mcg daily. TSH 0.81 on 04/12/2023. Plan repeat TSH 6 months.  An After Visit Summary was printed and given to the patient.  FOLLOW UP: Return in about 6 months (around 04/20/2024) for annual CPE (fasting). Next CPE January 2026 Signed:  Gerlene Hockey, MD           10/19/2023

## 2023-10-20 ENCOUNTER — Ambulatory Visit: Payer: Self-pay | Admitting: Family Medicine

## 2023-10-20 ENCOUNTER — Other Ambulatory Visit: Payer: Self-pay | Admitting: Family Medicine

## 2023-10-28 ENCOUNTER — Encounter: Payer: Self-pay | Admitting: Cardiovascular Disease

## 2023-10-28 ENCOUNTER — Ambulatory Visit: Attending: Cardiovascular Disease | Admitting: Cardiovascular Disease

## 2023-10-28 VITALS — BP 114/66 | HR 82 | Ht 71.0 in | Wt 176.0 lb

## 2023-10-28 DIAGNOSIS — F17200 Nicotine dependence, unspecified, uncomplicated: Secondary | ICD-10-CM

## 2023-10-28 DIAGNOSIS — E7800 Pure hypercholesterolemia, unspecified: Secondary | ICD-10-CM | POA: Diagnosis not present

## 2023-10-28 DIAGNOSIS — I2510 Atherosclerotic heart disease of native coronary artery without angina pectoris: Secondary | ICD-10-CM | POA: Diagnosis not present

## 2023-10-28 NOTE — Progress Notes (Signed)
 Chief Complaint  Patient presents with   Follow-up    CAD   History of Present Illness: 73 yo male with history of CAD, HLD, tobacco abuse and hypothyroidism here today for cardiac follow up. He had a NSTEMI in January 2005 and had a Cypher drug eluting stent placed in the occluded Circumflex and a Cypher drug eluting stent placed in the RCA. No caths since then. Stress myoview  in July 2012 showed no ischemia. Exercise stress test August 2016 with no ischemia. Echo February 2019 with LVEF=60-65%, LVH. The aortic root appeared to be mildly dilated. Chest CTA with no evidence of aneurysm or dissection of the aorta. Exercise stress test January 2020 with no ischemic EKG changes.   He is here today for follow up. The patient denies any chest pain, dyspnea, palpitations, lower extremity edema, orthopnea, PND, dizziness, near syncope or syncope.   Primary Care Physician: Candise Aleene DEL, MD  Past Medical History:  Diagnosis Date   Allergy    Aortic root enlargement [facility])    CTA chest/aorta 04/2017:  Mild enlargement of aortic root, measuring 3.9 x 3.8 x 3.6 cm.   Arthralgia of both knees 09/2015   Plain films with minimal DJD changes medial compartment, o/w nl.   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  03/2023 surveillance u/s-->Small amount of interval growth on follow-up ultrasound . plan rpt 3 yrs   Fatigue due to treatment    better when lopressor  dose was decreased to 12.5mg  bid 04/2017.   Hearing loss    Bilat sensorineural; [facility] ENT eval 07/2019; pt candidate for cochlear implants   History of adenomatous polyp of colon 05/19/2016   Recall 04/2021 (Dr. Leigh)   Hyperlipidemia    Hypothyroidism    Nephrolithiasis    passed one stone approx 2008; no prob since (saw urologist briefly). UC visit 06/2020 R flank pain, stone  suspected.   Normal nuclear stress test 2012   Prediabetes 02/2016   HbA1c 6.1%.  2019 A1c 6.3%. 2022 A1c 6.3%. Jan 2025 a1c 6.4%.   Prostatitis    When pt in 30s and 40s; saw Dr. Chales and eventually got a TURP per pt's description.  No probs since then.   Recurrent sinusitis    Sinus plain films 08/2014: bilat maxillary sinusitis (chronic)   S/P coronary artery stent placement 2005   RCA and LCX.  As of 02/2018 cardiology f/u, plan is to get ETT (pt doing fine, but was not having chest pain prior to last stent placement).   Tobacco dependence    current as of 01/2019    Past Surgical History:  Procedure Laterality Date   Aortic ultrasound  09/07/2018   Ectatic abd aorta (3.4 cm max diameter) but no aneurism.  Small amount of interval growth on follow-up ultrasound 03/2023---> repeat aortic ultrasound 3 years   APPENDECTOMY     CARDIAC CATHETERIZATION  04/16/2003   NORMAL. EF 60%. TOTALLY OCCLUDED LEFT CIRCUMFLEX WITH SEVERE DISEASE, AND ULCERATED PLAQUE IN THE PROXIMAL RIGHT CORONARY ARTERY AND MILD ATHERSCLEROSIS IN THE LEFT ANTERIOR DESCENDING   CARDIOVASCULAR STRESS TEST  2009;2012; 10/2014; 03/2018   2012 normal nuclear stress test.  2016 ETT no ischemia.  ETT normal 03/2018.   COLONOSCOPY W/ POLYPECTOMY  05/19/2016   Multiple polyps--tubular adenoma.  Recall 5 yrs (04/2021).   CORONARY STENT PLACEMENT  04/19/2003   STENT PLACEMENT IN THE LEFT CIRCUMFLEX CORONARY AND RIGHT CORONARY ARTERY. MILD RESIDUAL STENOSIS IN THE MID PORTION OF THE LEFT ANTERIOR DESCENDING   TRANSTHORACIC ECHOCARDIOGRAM  05/03/2017   EF 60-65%, normal valves, mildly enlarged aortic root.  f/u CT angio showed no signif aortic root enlargement    Current Outpatient Medications  Medication Sig Dispense Refill   aspirin 81 MG tablet Take 81 mg by mouth daily.     atorvastatin  (LIPITOR) 80 MG tablet TAKE 1 TABLET BY MOUTH EVERY DAY 90 tablet 0   bisoprolol  (ZEBETA ) 5 MG tablet Take 1 tablet (5 mg total) by  mouth daily. 90 tablet 0   Cyanocobalamin (VITAMIN B-12 PO) Take 1 tablet by mouth daily.     ezetimibe  (ZETIA ) 10 MG tablet Take 1 tablet (10 mg total) by mouth daily. 30 tablet 1   fluticasone  (FLONASE ) 50 MCG/ACT nasal spray Place 2 sprays into both nostrils daily. 48 g 3   levothyroxine  (SYNTHROID ) 112 MCG tablet Take 1 tablet (112 mcg total) by mouth daily. 90 tablet 3   metFORMIN  (GLUCOPHAGE ) 500 MG tablet 1 tab po bid 180 tablet 3   montelukast  (SINGULAIR ) 10 MG tablet TAKE 1 TABLET BY MOUTH EVERYDAY AT BEDTIME 90 tablet 3   No current facility-administered medications for this visit.    Allergies  Allergen Reactions   Plavix [Clopidogrel Bisulfate] Rash    Social History   Socioeconomic History   Marital status: Married    Spouse name: Not on file   Number of children: 2   Years of education: Not on file   Highest education level: Not on file  Occupational History   Occupation: Sales Rep  Tobacco Use   Smoking status: Every Day    Current packs/day: 1.00    Types: Cigarettes   Smokeless tobacco: Never  Vaping Use   Vaping status: Never Used  Substance and Sexual Activity   Alcohol use: No   Drug use: No   Sexual activity: Yes  Other Topics Concern   Not on file  Social History Narrative   Married, 1 son in [place] and one in [place].   Lives in [place].   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   Tob: 40 pack-yr hx, current as of 08/2020.   Alcohol: social/rare.   Social Drivers of Corporate investment banker Strain: Low Risk  (12/08/2022)   Overall Financial Resource Strain (CARDIA)    Difficulty of Paying Living Expenses: Not hard at all  Food Insecurity: No Food Insecurity (12/08/2022)   Hunger Vital Sign    Worried About Running Out of Food in the Last Year: Never true    Ran Out of Food in the Last Year: Never true  Transportation Needs: No Transportation Needs (12/08/2022)   PRAPARE -  Administrator, Civil Service (Medical): No    Lack of Transportation (Non-Medical): No  Physical Activity: Sufficiently Active (12/08/2022)   Exercise Vital Sign    Days of Exercise per Week: 5 days    Minutes of Exercise per Session: 30 min  Stress: No Stress Concern Present (12/08/2022)   [employer] of Occupational Health - Occupational Stress Questionnaire    Feeling of Stress : Not at all  Social Connections: Moderately Integrated (12/08/2022)   Social Connection and Isolation Panel    Frequency of Communication with Friends and Family:  More than three times a week    Frequency of Social Gatherings with Friends and Family: More than three times a week    Attends Religious Services: 1 to 4 times per year    Active Member of [employer] or Organizations: No    Attends Banker Meetings: Never    Marital Status: Married  Catering manager Violence: Not At Risk (12/08/2022)   Humiliation, Afraid, Rape, and Kick questionnaire    Fear of Current or Ex-Partner: No    Emotionally Abused: No    Physically Abused: No    Sexually Abused: No    Family History  Problem Relation Age of Onset   Hypertension Mother    Stroke Mother    Hypertension Father    Stroke Father    Prostate cancer Father    Heart attack Brother        STENT   Stroke Brother    Colon cancer Neg Hx     Review of Systems:  As stated in the HPI and otherwise negative.   BP 114/66   Pulse 82   Ht 5' 11 (1.803 m)   Wt 176 lb (79.8 kg)   SpO2 97%   BMI 24.55 kg/m   Physical Examination: General: Well developed, well nourished, NAD  HEENT: OP clear, mucus membranes moist  SKIN: warm, dry. No rashes. Neuro: No focal deficits  Musculoskeletal: Muscle strength 5/5 all ext  Psychiatric: Mood and affect normal  Neck: No JVD, no carotid bruits, no thyromegaly, no lymphadenopathy.  Lungs:Clear bilaterally, no wheezes, rhonci, crackles Cardiovascular: Regular rate and rhythm. No murmurs,  gallops or rubs. Abdomen:Soft. Bowel sounds present. Non-tender.  Extremities: No lower extremity edema. Pulses are 2 + in the bilateral DP/PT  Echo February 2019: Left ventricle: The cavity size was normal. There was severe   focal basal and moderate concentric hypertrophy. Systolic   function was normal. The estimated ejection fraction was in the   range of 60% to 65%. Wall motion was normal; there were no   regional wall motion abnormalities. Left ventricular diastolic   function parameters were normal. - Aortic valve: Moderate focal calcification involving the   noncoronary cusp. - Aorta: Aortic root dimension: 42 mm (ED). Ascending aortic   diameter: 39 mm (S). - Aortic root: The aortic root was mildly dilated. - Ascending aorta: The ascending aorta was mildly dilated. - Left atrium: The atrium was moderately dilated.  EKG:  EKG is ordered today. The ekg ordered today demonstrates  EKG Interpretation Date/Time:  Friday October 28 2023 10:59:35 EDT Ventricular Rate:  83 PR Interval:  188 QRS Duration:  84 QT Interval:  362 QTC Calculation: 425 R Axis:   13  Text Interpretation: Normal sinus rhythm Normal ECG Confirmed by Verlin Bruckner [phone]) on 10/28/2023 11:00:41 AM    Recent Labs: 04/12/2023: TSH 0.81 10/19/2023: ALT 22; BUN 14; Creatinine, Ser 0.91; Potassium 4.3; Sodium 140   Lipid Panel    Component Value Date/Time   CHOL 107 10/19/2023 0902   CHOL 114 08/21/2021 0739   TRIG 99.0 10/19/2023 0902   HDL 39.10 10/19/2023 0902   HDL 41 08/21/2021 0739   CHOLHDL 3 10/19/2023 0902   VLDL 19.8 10/19/2023 0902   LDLCALC 48 10/19/2023 0902   LDLCALC 52 08/21/2021 0739     Wt Readings from Last 3 Encounters:  10/28/23 176 lb (79.8 kg)  10/19/23 175 lb 3.2 oz (79.5 kg)  07/20/23 181 lb (82.1 kg)    Assessment and  Plan:   1. CAD without angina: LV function normal by echo in February 2019. Normal exercise stress test January 2020. No chest pain. Continue ASA,  statin and Zetia . Beta blocker stopped due to evening drowsiness.   2. Tobacco abuse: Smoking cessation counseling provided.   3. Hyperlipidemia: LDL 48 in July 2025. Continue statin and Zetia .   Labs/ tests ordered today include:  Orders Placed This Encounter  Procedures   EKG 12-Lead   Disposition:   F/U with me in 12 months  Signed, Lonni Cash, MD 10/28/2023 11:22 AM    [facility] [address] [place], [place], [place]  [address] Phone: [phone]; Fax: [phone]

## 2023-10-28 NOTE — Patient Instructions (Signed)
 Medication Instructions:  No medication changes were made at this visit. Continue current regimen.   Follow-Up: At [facility], you and your health needs are our priority.  As part of our continuing mission to provide you with exceptional heart care, our providers are all part of one team.  This team includes your primary Cardiologist (physician) and Advanced Practice Providers or APPs (Physician Assistants and Nurse Practitioners) who all work together to provide you with the care you need, when you need it.  Your next appointment:   1 year(s)  Provider:   Lonni Cash, MD    We recommend signing up for the patient portal called MyChart.  Sign up information is provided on this After Visit Summary.  MyChart is used to connect with patients for Virtual Visits (Telemedicine).  Patients are able to view lab/test results, encounter notes, upcoming appointments, etc.  Non-urgent messages can be sent to your provider as well.   To learn more about what you can do with MyChart, go to [email or web address].

## 2023-11-11 ENCOUNTER — Other Ambulatory Visit: Payer: Self-pay | Admitting: Family Medicine

## 2023-11-30 ENCOUNTER — Ambulatory Visit (INDEPENDENT_AMBULATORY_CARE_PROVIDER_SITE_OTHER): Admitting: *Deleted

## 2023-11-30 VITALS — Ht 71.0 in | Wt 176.0 lb

## 2023-11-30 DIAGNOSIS — Z0000 Encounter for general adult medical examination without abnormal findings: Secondary | ICD-10-CM

## 2023-11-30 NOTE — Progress Notes (Signed)
 Subjective:   Lawrence Hunter is a 73 y.o. male who presents for Medicare Annual/Subsequent preventive examination.  Visit Complete: Virtual I connected with  Lawrence Hunter on 11/30/23 by a audio enabled telemedicine application and verified that I am speaking with the correct person using two identifiers.  Patient Location: Home  Provider Location: Home Office  I discussed the limitations of evaluation and management by telemedicine. The patient expressed understanding and agreed to proceed.  Vital Signs: Because this visit was a virtual/telehealth visit, some criteria may be missing or patient reported. Any vitals not documented were not able to be obtained and vitals that have been documented are patient reported. Cardiac Risk Factors include: advanced age (>13men, >1 women);male gender     Objective:    Today's Vitals   11/30/23 0818  Weight: 176 lb (79.8 kg)  Height: 5' 11 (1.803 m)   Body mass index is 24.55 kg/m.     11/30/2023    8:15 AM 12/08/2022    3:14 PM 11/18/2021    9:27 AM 11/12/2020   10:48 AM 09/03/2018    7:59 PM 05/19/2016   10:32 AM 05/05/2016    1:59 PM  Advanced Directives  Does Patient Have a Medical Advance Directive? Yes Yes Yes Yes Yes Yes  Yes   Type of Estate agent of [employer] Power of [place];Living will Healthcare Power of [place];Living will Healthcare Power of [employer] of [place];Living will Healthcare Power of [place];Living will  Copy of Healthcare Power of Attorney in Chart? No - copy requested No - copy requested Yes - validated most recent copy scanned in chart (See row information) Yes - validated most recent copy scanned in chart (See row information)        Data saved with a previous flowsheet row definition    Current Medications (verified) Outpatient Encounter Medications as of 11/30/2023  Medication Sig   aspirin 81 MG tablet Take 81 mg by mouth daily.   atorvastatin   (LIPITOR) 80 MG tablet TAKE 1 TABLET BY MOUTH EVERY DAY   bisoprolol  (ZEBETA ) 5 MG tablet Take 1 tablet (5 mg total) by mouth daily.   Cyanocobalamin (VITAMIN B-12 PO) Take 1 tablet by mouth daily.   ezetimibe  (ZETIA ) 10 MG tablet TAKE 1 TABLET BY MOUTH EVERY DAY   fluticasone  (FLONASE ) 50 MCG/ACT nasal spray Place 2 sprays into both nostrils daily.   levothyroxine  (SYNTHROID ) 112 MCG tablet Take 1 tablet (112 mcg total) by mouth daily.   metFORMIN  (GLUCOPHAGE ) 500 MG tablet 1 tab po bid   montelukast  (SINGULAIR ) 10 MG tablet TAKE 1 TABLET BY MOUTH EVERYDAY AT BEDTIME   No facility-administered encounter medications on file as of 11/30/2023.    Allergies (verified) Plavix [clopidogrel bisulfate]   History: Past Medical History:  Diagnosis Date   Allergy    Aortic root enlargement (HCC)    CTA chest/aorta 04/2017:  Mild enlargement of aortic root, measuring 3.9 x 3.8 x 3.6 cm.   Arthralgia of both knees 09/2015   Plain films with minimal DJD changes medial compartment, o/w nl.   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  03/2023 surveillance u/s-->Small amount of interval growth on follow-up ultrasound . plan rpt 3 yrs   Fatigue due to treatment  better when lopressor  dose was decreased to 12.5mg  bid 04/2017.   Hearing loss    Bilat sensorineural; [facility]NT eval 07/2019; pt candidate for cochlear implants   History of adenomatous polyp of colon 05/19/2016   Recall 04/2021 (Dr. Leigh)   Hyperlipidemia    Hypothyroidism    Nephrolithiasis    passed one stone approx 2008; no prob since (saw urologist briefly). UC visit 06/2020 R flank pain, stone suspected.   Normal nuclear stress test 2012   Prediabetes 02/2016   HbA1c 6.1%.  2019 A1c 6.3%. 2022 A1c 6.3%. Jan 2025 a1c 6.4%.   Prostatitis    When pt in 30s and 40s; saw Dr.  Chales and eventually got a TURP per pt's description.  No probs since then.   Recurrent sinusitis    Sinus plain films 08/2014: bilat maxillary sinusitis (chronic)   S/P coronary artery stent placement 2005   RCA and LCX.  As of 02/2018 cardiology f/u, plan is to get ETT (pt doing fine, but was not having chest pain prior to last stent placement).   Tobacco dependence    current as of 01/2019   Past Surgical History:  Procedure Laterality Date   Aortic ultrasound  09/07/2018   Ectatic abd aorta (3.4 cm max diameter) but no aneurism.  Small amount of interval growth on follow-up ultrasound 03/2023---> repeat aortic ultrasound 3 years   APPENDECTOMY     CARDIAC CATHETERIZATION  04/16/2003   NORMAL. EF 60%. TOTALLY OCCLUDED LEFT CIRCUMFLEX WITH SEVERE DISEASE, AND ULCERATED PLAQUE IN THE PROXIMAL RIGHT CORONARY ARTERY AND MILD ATHERSCLEROSIS IN THE LEFT ANTERIOR DESCENDING   CARDIOVASCULAR STRESS TEST  2009;2012; 10/2014; 03/2018   2012 normal nuclear stress test.  2016 ETT no ischemia.  ETT normal 03/2018.   COLONOSCOPY W/ POLYPECTOMY  05/19/2016   Multiple polyps--tubular adenoma.  Recall 5 yrs (04/2021).   CORONARY STENT PLACEMENT  04/19/2003   STENT PLACEMENT IN THE LEFT CIRCUMFLEX CORONARY AND RIGHT CORONARY ARTERY. MILD RESIDUAL STENOSIS IN THE MID PORTION OF THE LEFT ANTERIOR DESCENDING   TRANSTHORACIC ECHOCARDIOGRAM  05/03/2017   EF 60-65%, normal valves, mildly enlarged aortic root.  f/u CT angio showed no signif aortic root enlargement   Family History  Problem Relation Age of Onset   Hypertension Mother    Stroke Mother    Hypertension Father    Stroke Father    Prostate cancer Father    Heart attack Brother        STENT   Stroke Brother    Colon cancer Neg Hx    Social History   Socioeconomic History   Marital status: Married    Spouse name: Not on file   Number of children: 2   Years of education: Not on file   Highest education level: Not on file  Occupational  History   Occupation: Airline pilot Rep  Tobacco Use   Smoking status: Every Day    Current packs/day: 1.00    Types: Cigarettes   Smokeless tobacco: Never  Vaping Use   Vaping status: Never Used  Substance and Sexual Activity   Alcohol use: No   Drug use: No   Sexual activity: Yes  Other Topics Concern   Not on file  Social History Narrative   Married, 1 son in [place] and one in [place].   Lives in [place].   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   Tob: 40 pack-yr hx, current as of  08/2020.   Alcohol: social/rare.   Social Drivers of Corporate investment banker Strain: Low Risk  (11/30/2023)   Overall Financial Resource Strain (CARDIA)    Difficulty of Paying Living Expenses: Not hard at all  Food Insecurity: No Food Insecurity (11/30/2023)   Hunger Vital Sign    Worried About Running Out of Food in the Last Year: Never true    Ran Out of Food in the Last Year: Never true  Transportation Needs: No Transportation Needs (11/30/2023)   PRAPARE - Administrator, Civil Service (Medical): No    Lack of Transportation (Non-Medical): No  Physical Activity: Insufficiently Active (11/30/2023)   Exercise Vital Sign    Days of Exercise per Week: 4 days    Minutes of Exercise per Session: 30 min  Stress: No Stress Concern Present (11/30/2023)   [employer] of Occupational Health - Occupational Stress Questionnaire    Feeling of Stress: Not at all  Social Connections: Moderately Integrated (11/30/2023)   Social Connection and Isolation Panel    Frequency of Communication with Friends and Family: More than three times a week    Frequency of Social Gatherings with Friends and Family: More than three times a week    Attends Religious Services: 1 to 4 times per year    Active Member of [employer] or Organizations: No    Attends Engineer, structural: Never    Marital Status: Married    Tobacco Counseling Ready  to quit: Not Answered Counseling given: Not Answered   Clinical Intake:  Pre-visit preparation completed: Yes  Pain : No/denies pain     Diabetes: No  How often do you need to have someone help you when you read instructions, pamphlets, or other written materials from your doctor or pharmacy?: 1 - Never  Interpreter Needed?: No  Information entered by :: Mliss Graff LPN   Activities of Daily Living    11/30/2023    8:18 AM 12/08/2022    3:12 PM  In your present state of health, do you have any difficulty performing the following activities:  Hearing? 1 1  Comment  hearing aids  Vision? 0 0  Difficulty concentrating or making decisions? 0 0  Walking or climbing stairs? 0 0  Dressing or bathing? 0 0  Doing errands, shopping? 0 0  Preparing Food and eating ? N N  Using the Toilet? N N  In the past six months, have you accidently leaked urine? N N  Do you have problems with loss of bowel control? N N  Managing your Medications? N N  Managing your Finances? N N  Housekeeping or managing your Housekeeping? N N    Patient Care Team: Candise Aleene DEL, MD as PCP - General (Family Medicine) Verlin Lonni BIRCH, MD as PCP - Cardiology (Cardiology) Verlin Lonni BIRCH, MD as Consulting Physician (Cardiology) Armbruster, Elspeth SQUIBB, MD as Consulting Physician (Gastroenterology) Verlin Lonni BIRCH, MD as Consulting Physician (Cardiology)  Indicate any recent Medical Services you may have received from other than [facility] providers in the past year (date may be approximate).     Assessment:   This is a routine wellness examination for Sade.  Hearing/Vision screen Hearing Screening - Comments:: Bilateral hearing aids Vision Screening - Comments:: [facility] Up to date   Goals Addressed             This Visit's Progress    Patient Stated   Not on track    Stay  healthy and exercise more      Patient Stated       Use gym more        Depression Screen    11/30/2023    8:20 AM 10/19/2023    8:42 AM 07/20/2023    9:04 AM 04/12/2023    9:34 AM 12/08/2022    3:15 PM 08/23/2022    1:15 PM 04/26/2022    9:15 AM  PHQ 2/9 Scores  PHQ - 2 Score 0 0 0 0 0 0 0  PHQ- 9 Score 0   0       Fall Risk    11/30/2023    8:16 AM 10/19/2023    8:42 AM 07/20/2023    9:04 AM 04/12/2023    9:33 AM 12/08/2022    3:16 PM  Fall Risk   Falls in the past year? 0 0 0 0 0  Number falls in past yr: 0 0 0  0  Injury with Fall? 0 0 0  0  Risk for fall due to :  No Fall Risks No Fall Risks  Impaired vision  Follow up Falls evaluation completed;Education provided;Falls prevention discussed Falls evaluation completed Falls evaluation completed Falls evaluation completed Falls prevention discussed    MEDICARE RISK AT HOME: Medicare Risk at Home Any stairs in or around the home?: Yes If so, are there any without handrails?: No Home free of loose throw rugs in walkways, pet beds, electrical cords, etc?: Yes Adequate lighting in your home to reduce risk of falls?: Yes Life alert?: No Use of a cane, walker or w/c?: No Grab bars in the bathroom?: Yes Shower chair or bench in shower?: Yes Elevated toilet seat or a handicapped toilet?: Yes  TIMED UP AND GO:  Was the test performed?  No    Cognitive Function:        11/30/2023    8:16 AM 12/08/2022    9:47 AM 11/18/2021    9:28 AM  6CIT Screen  What Year? 0 points 0 points 0 points  What month? 0 points 0 points 0 points  What time? 0 points 0 points 0 points  Count back from 20 0 points 0 points 0 points  Months in reverse 0 points 0 points 0 points  Repeat phrase 0 points 0 points 0 points  Total Score 0 points 0 points 0 points    Immunizations Immunization History  Administered Date(s) Administered   Fluad Quad(high Dose 65+) 01/01/2022   INFLUENZA, HIGH DOSE SEASONAL PF 02/20/2015, 12/31/2015, 12/23/2017, 12/31/2019, 12/23/2020, 01/20/2023   Influenza, Quadrivalent, Recombinant,  Inj, Pf 01/03/2019   Influenza-Unspecified 02/11/2014, 12/23/2017   Moderna Covid-19 Vaccine Bivalent Booster 54yrs & up 11/19/2020   Moderna Sars-Covid-2 Vaccination 04/21/2019, 05/21/2019, 01/16/2020   Pneumococcal Conjugate-13 08/21/2014   Pneumococcal Polysaccharide-23 03/18/2016   Tdap 08/21/2014   Zoster Recombinant(Shingrix ) 12/15/2021, 04/28/2022    TDAP status: Up to date  Flu Vaccine status: Due, Education has been provided regarding the importance of this vaccine. Advised may receive this vaccine at local pharmacy or Health Dept. Aware to provide a copy of the vaccination record if obtained from local pharmacy or Health Dept. Verbalized acceptance and understanding.  Pneumococcal vaccine status: Up to date  Covid-19 vaccine status: Information provided on how to obtain vaccines.   Qualifies for Shingles Vaccine? No   Zostavax completed Yes   Shingrix  Completed?: Yes  Screening Tests Health Maintenance  Topic Date Due   Influenza Vaccine  10/21/2023   COVID-19 Vaccine (5 -  2025-26 season) 11/21/2023   DTaP/Tdap/Td (2 - Td or Tdap) 08/20/2024   Medicare Annual Wellness (AWV)  11/29/2024   Colonoscopy  05/19/2026   Pneumococcal Vaccine: 50+ Years  Completed   Hepatitis C Screening  Completed   Zoster Vaccines- Shingrix   Completed   HPV VACCINES  Aged Out   Meningococcal B Vaccine  Aged Out    Health Maintenance  Health Maintenance Due  Topic Date Due   Influenza Vaccine  10/21/2023   COVID-19 Vaccine (5 - 2025-26 season) 11/21/2023    Colorectal cancer screening: Type of screening: Colonoscopy. Completed 2018. Repeat every 10 years  Lung Cancer Screening: (Low Dose CT Chest recommended if Age 31-80 years, 20 pack-year currently smoking OR have quit w/in 15years.) does not qualify.   Lung Cancer Screening Referral:   Additional Screening:  Hepatitis C Screening: does not qualify; Completed 2018  Vision Screening: Recommended annual ophthalmology exams for  early detection of glaucoma and other disorders of the eye. Is the patient up to date with their annual eye exam?  Yes  Who is the provider or what is the name of the office in which the patient attends annual eye exams? [facility] If pt is not established with a provider, would they like to be referred to a provider to establish care? No .   Dental Screening: Recommended annual dental exams for proper oral hygiene    Community Resource Referral / Chronic Care Management: CRR required this visit?  No   CCM required this visit?  No     Plan:     I have personally reviewed and noted the following in the patient's chart:   Medical and social history Use of alcohol, tobacco or illicit drugs  Current medications and supplements including opioid prescriptions. Patient is not currently taking opioid prescriptions. Functional ability and status Nutritional status Physical activity Advanced directives List of other physicians Hospitalizations, surgeries, and ER visits in previous 12 months Vitals Screenings to include cognitive, depression, and falls Referrals and appointments  In addition, I have reviewed and discussed with patient certain preventive protocols, quality metrics, and best practice recommendations. A written personalized care plan for preventive services as well as general preventive health recommendations were provided to patient.     Mliss Graff, LPN   [id number]   After Visit Summary: (MyChart) Due to this being a telephonic visit, the after visit summary with patients personalized plan was offered to patient via MyChart   Nurse Notes:

## 2023-11-30 NOTE — Patient Instructions (Signed)
 Lawrence Hunter , Thank you for taking time to come for your Medicare Wellness Visit. I appreciate your ongoing commitment to your health goals. Please review the following plan we discussed and let me know if I can assist you in the future.   Screening recommendations/referrals: Colonoscopy: no longer required Recommended yearly ophthalmology/optometry visit for glaucoma screening and checkup Recommended yearly dental visit for hygiene and checkup  Vaccinations: Influenza vaccine: Education provided Pneumococcal vaccine: up to date Tdap vaccine: up to date Shingles vaccine: up to date        Preventive Care 65 Years and Older, Male Preventive care refers to lifestyle choices and visits with your health care provider that can promote health and wellness. What does preventive care include? A yearly physical exam. This is also called an annual well check. Dental exams once or twice a year. Routine eye exams. Ask your health care provider how often you should have your eyes checked. Personal lifestyle choices, including: Daily care of your teeth and gums. Regular physical activity. Eating a healthy diet. Avoiding tobacco and drug use. Limiting alcohol use. Practicing safe sex. Taking low doses of aspirin every day. Taking vitamin and mineral supplements as recommended by your health care provider. What happens during an annual well check? The services and screenings done by your health care provider during your annual well check will depend on your age, overall health, lifestyle risk factors, and family history of disease. Counseling  Your health care provider may ask you questions about your: Alcohol use. Tobacco use. Drug use. Emotional well-being. Home and relationship well-being. Sexual activity. Eating habits. History of falls. Memory and ability to understand (cognition). Work and work Astronomer. Screening  You may have the following tests or measurements: Height,  weight, and BMI. Blood pressure. Lipid and cholesterol levels. These may be checked every 5 years, or more frequently if you are over 85 years old. Skin check. Lung cancer screening. You may have this screening every year starting at age 87 if you have a 30-pack-year history of smoking and currently smoke or have quit within the past 15 years. Fecal occult blood test (FOBT) of the stool. You may have this test every year starting at age 34. Flexible sigmoidoscopy or colonoscopy. You may have a sigmoidoscopy every 5 years or a colonoscopy every 10 years starting at age 24. Prostate cancer screening. Recommendations will vary depending on your family history and other risks. Hepatitis C blood test. Hepatitis B blood test. Sexually transmitted disease (STD) testing. Diabetes screening. This is done by checking your blood sugar (glucose) after you have not eaten for a while (fasting). You may have this done every 1-3 years. Abdominal aortic aneurysm (AAA) screening. You may need this if you are a current or former smoker. Osteoporosis. You may be screened starting at age 81 if you are at high risk. Talk with your health care provider about your test results, treatment options, and if necessary, the need for more tests. Vaccines  Your health care provider may recommend certain vaccines, such as: Influenza vaccine. This is recommended every year. Tetanus, diphtheria, and acellular pertussis (Tdap, Td) vaccine. You may need a Td booster every 10 years. Zoster vaccine. You may need this after age 61. Pneumococcal 13-valent conjugate (PCV13) vaccine. One dose is recommended after age 42. Pneumococcal polysaccharide (PPSV23) vaccine. One dose is recommended after age 80. Talk to your health care provider about which screenings and vaccines you need and how often you need them. This information is not  intended to replace advice given to you by your health care provider. Make sure you discuss any  questions you have with your health care provider. Document Released: 04/04/2015 Document Revised: 11/26/2015 Document Reviewed: 01/07/2015 Elsevier Interactive Patient Education  2017 [employer].  Fall Prevention in the Home Falls can cause injuries. They can happen to people of all ages. There are many things you can do to make your home safe and to help prevent falls. What can I do on the outside of my home? Regularly fix the edges of walkways and driveways and fix any cracks. Remove anything that might make you trip as you walk through a door, such as a raised step or threshold. Trim any bushes or trees on the path to your home. Use bright outdoor lighting. Clear any walking paths of anything that might make someone trip, such as rocks or tools. Regularly check to see if handrails are loose or broken. Make sure that both sides of any steps have handrails. Any raised decks and porches should have guardrails on the edges. Have any leaves, snow, or ice cleared regularly. Use sand or salt on walking paths during winter. Clean up any spills in your garage right away. This includes oil or grease spills. What can I do in the bathroom? Use night lights. Install grab bars by the toilet and in the tub and shower. Do not use towel bars as grab bars. Use non-skid mats or decals in the tub or shower. If you need to sit down in the shower, use a plastic, non-slip stool. Keep the floor dry. Clean up any water that spills on the floor as soon as it happens. Remove soap buildup in the tub or shower regularly. Attach bath mats securely with double-sided non-slip rug tape. Do not have throw rugs and other things on the floor that can make you trip. What can I do in the bedroom? Use night lights. Make sure that you have a light by your bed that is easy to reach. Do not use any sheets or blankets that are too big for your bed. They should not hang down onto the floor. Have a firm chair that has side  arms. You can use this for support while you get dressed. Do not have throw rugs and other things on the floor that can make you trip. What can I do in the kitchen? Clean up any spills right away. Avoid walking on wet floors. Keep items that you use a lot in easy-to-reach places. If you need to reach something above you, use a strong step stool that has a grab bar. Keep electrical cords out of the way. Do not use floor polish or wax that makes floors slippery. If you must use wax, use non-skid floor wax. Do not have throw rugs and other things on the floor that can make you trip. What can I do with my stairs? Do not leave any items on the stairs. Make sure that there are handrails on both sides of the stairs and use them. Fix handrails that are broken or loose. Make sure that handrails are as long as the stairways. Check any carpeting to make sure that it is firmly attached to the stairs. Fix any carpet that is loose or worn. Avoid having throw rugs at the top or bottom of the stairs. If you do have throw rugs, attach them to the floor with carpet tape. Make sure that you have a light switch at the top of the stairs  and the bottom of the stairs. If you do not have them, ask someone to add them for you. What else can I do to help prevent falls? Wear shoes that: Do not have high heels. Have rubber bottoms. Are comfortable and fit you well. Are closed at the toe. Do not wear sandals. If you use a stepladder: Make sure that it is fully opened. Do not climb a closed stepladder. Make sure that both sides of the stepladder are locked into place. Ask someone to hold it for you, if possible. Clearly mark and make sure that you can see: Any grab bars or handrails. First and last steps. Where the edge of each step is. Use tools that help you move around (mobility aids) if they are needed. These include: Canes. Walkers. Scooters. Crutches. Turn on the lights when you go into a dark area.  Replace any light bulbs as soon as they burn out. Set up your furniture so you have a clear path. Avoid moving your furniture around. If any of your floors are uneven, fix them. If there are any pets around you, be aware of where they are. Review your medicines with your doctor. Some medicines can make you feel dizzy. This can increase your chance of falling. Ask your doctor what other things that you can do to help prevent falls. This information is not intended to replace advice given to you by your health care provider. Make sure you discuss any questions you have with your health care provider. Document Released: 01/02/2009 Document Revised: 08/14/2015 Document Reviewed: 04/12/2014 Elsevier Interactive Patient Education  2017 [employer].

## 2024-01-03 ENCOUNTER — Other Ambulatory Visit: Payer: Self-pay | Admitting: Cardiovascular Disease

## 2024-01-03 DIAGNOSIS — E7800 Pure hypercholesterolemia, unspecified: Secondary | ICD-10-CM

## 2024-01-14 ENCOUNTER — Other Ambulatory Visit: Payer: Self-pay | Admitting: Family Medicine

## 2024-01-18 ENCOUNTER — Telehealth: Payer: Self-pay | Admitting: Family Medicine

## 2024-01-18 NOTE — Telephone Encounter (Unsigned)
 Copied from CRM #[id number]. Topic: Clinical - Medication Refill >> Jan 18, 2024  4:46 PM Alfonso HERO wrote: Medication:  levothyroxine  (SYNTHROID ) 112 MCG tablet montelukast  (SINGULAIR ) 10 MG tablet  Has the patient contacted their pharmacy? Yes (Agent: If no, request that the patient contact the pharmacy for the refill. If patient does not wish to contact the pharmacy document the reason why and proceed with request.) (Agent: If yes, when and what did the pharmacy advise?)  This is the patient's preferred pharmacy:  [id number] - [place], [place] - [address] [place] [address] [place] [address] [place] [address] Phone: [phone] Fax: [phone]  Is this the correct pharmacy for this prescription? Yes If no, delete pharmacy and type the correct one.   Has the prescription been filled recently? Yes  Is the patient out of the medication? No  Has the patient been seen for an appointment in the last year OR does the patient have an upcoming appointment? Yes  Can we respond through MyChart? Yes  Agent: Please be advised that Rx refills may take up to 3 business days. We ask that you follow-up with your pharmacy.

## 2024-01-25 NOTE — Telephone Encounter (Signed)
 Pt called in to check the status of refill request submitted on 10/29. He also wanted to see if Dr Candise would send in a refill for Bisoprolol  5mg , he says that they were trying it out and he's been off of it for a while but has recently started back taking it and needs more.

## 2024-01-31 ENCOUNTER — Encounter: Payer: Self-pay | Admitting: Family Medicine

## 2024-01-31 MED ORDER — BISOPROLOL FUMARATE 5 MG PO TABS: 5.0000 mg | ORAL_TABLET | Freq: Every day | ORAL | 3 refills | Status: AC

## 2024-01-31 MED ORDER — LEVOTHYROXINE SODIUM 112 MCG PO TABS: 112.0000 ug | ORAL_TABLET | Freq: Every day | ORAL | 3 refills | Status: AC

## 2024-01-31 MED ORDER — MONTELUKAST SODIUM 10 MG PO TABS: ORAL_TABLET | ORAL | 3 refills | Status: AC

## 2024-01-31 NOTE — Telephone Encounter (Signed)
 Spoke with patient regarding results/recommendations.

## 2024-01-31 NOTE — Telephone Encounter (Signed)
 Please call Lawrence Hunter and tell him I am sorry for the late response to this request.  All 3 medications sent today.

## 2024-04-12 ENCOUNTER — Other Ambulatory Visit: Payer: Self-pay

## 2024-04-12 MED ORDER — METFORMIN HCL 500 MG PO TABS: ORAL_TABLET | ORAL | 0 refills | Status: DC

## 2024-04-21 ENCOUNTER — Other Ambulatory Visit: Payer: Self-pay | Admitting: Family Medicine

## 2024-04-23 ENCOUNTER — Encounter: Admitting: Family Medicine

## 2024-04-26 ENCOUNTER — Ambulatory Visit: Admitting: Family Medicine

## 2024-04-26 ENCOUNTER — Ambulatory Visit: Payer: Self-pay | Admitting: Family Medicine

## 2024-04-26 ENCOUNTER — Encounter: Payer: Self-pay | Admitting: Family Medicine

## 2024-04-26 VITALS — BP 110/60 | HR 75 | Temp 97.3°F | Ht 69.75 in | Wt 189.0 lb

## 2024-04-26 DIAGNOSIS — Z0000 Encounter for general adult medical examination without abnormal findings: Secondary | ICD-10-CM

## 2024-04-26 DIAGNOSIS — R7303 Prediabetes: Secondary | ICD-10-CM

## 2024-04-26 DIAGNOSIS — E7800 Pure hypercholesterolemia, unspecified: Secondary | ICD-10-CM

## 2024-04-26 DIAGNOSIS — G4719 Other hypersomnia: Secondary | ICD-10-CM

## 2024-04-26 DIAGNOSIS — E039 Hypothyroidism, unspecified: Secondary | ICD-10-CM

## 2024-04-26 DIAGNOSIS — I10 Essential (primary) hypertension: Secondary | ICD-10-CM

## 2024-04-26 LAB — BASIC METABOLIC PANEL WITH GFR
BUN: 12 mg/dL (ref 6–23)
CO2: 28 meq/L (ref 19–32)
Calcium: 9.4 mg/dL (ref 8.4–10.5)
Chloride: 104 meq/L (ref 96–112)
Creatinine, Ser: 0.97 mg/dL (ref 0.40–1.50)
GFR: 76.56 mL/min
Glucose, Bld: 81 mg/dL (ref 70–99)
Potassium: 4.2 meq/L (ref 3.5–5.1)
Sodium: 141 meq/L (ref 135–145)

## 2024-04-26 LAB — CBC WITH DIFFERENTIAL/PLATELET
Basophils Absolute: 0.1 10*3/uL (ref 0.0–0.1)
Basophils Relative: 0.9 % (ref 0.0–3.0)
Eosinophils Absolute: 0.4 10*3/uL (ref 0.0–0.7)
Eosinophils Relative: 4.4 % (ref 0.0–5.0)
HCT: 48.1 % (ref 39.0–52.0)
Hemoglobin: 16.3 g/dL (ref 13.0–17.0)
Lymphocytes Relative: 37.2 % (ref 12.0–46.0)
Lymphs Abs: 3 10*3/uL (ref 0.7–4.0)
MCHC: 33.9 g/dL (ref 30.0–36.0)
MCV: 87.3 fl (ref 78.0–100.0)
Monocytes Absolute: 0.6 10*3/uL (ref 0.1–1.0)
Monocytes Relative: 7.3 % (ref 3.0–12.0)
Neutro Abs: 4.1 10*3/uL (ref 1.4–7.7)
Neutrophils Relative %: 50.2 % (ref 43.0–77.0)
Platelets: 198 10*3/uL (ref 150.0–400.0)
RBC: 5.51 Mil/uL (ref 4.22–5.81)
RDW: 14.7 % (ref 11.5–15.5)
WBC: 8.2 10*3/uL (ref 4.0–10.5)

## 2024-04-26 LAB — HEMOGLOBIN A1C: Hgb A1c MFr Bld: 6.3 % (ref 4.6–6.5)

## 2024-04-26 LAB — TSH: TSH: 1.5 u[IU]/mL (ref 0.35–5.50)

## 2024-04-26 MED ORDER — METFORMIN HCL 500 MG PO TABS: ORAL_TABLET | ORAL | 3 refills | Status: AC

## 2024-04-26 MED ORDER — EZETIMIBE 10 MG PO TABS: 10.0000 mg | ORAL_TABLET | Freq: Every day | ORAL | 3 refills | Status: AC

## 2024-04-26 NOTE — Patient Instructions (Signed)
 Health Maintenance, Male  Adopting a healthy lifestyle and getting preventive care are important in promoting health and wellness. Ask your health care provider about:  The right schedule for you to have regular tests and exams.  Things you can do on your own to prevent diseases and keep yourself healthy.  What should I know about diet, weight, and exercise?  Eat a healthy diet    Eat a diet that includes plenty of vegetables, fruits, low-fat dairy products, and lean protein.  Do not eat a lot of foods that are high in solid fats, added sugars, or sodium.  Maintain a healthy weight  Body mass index (BMI) is a measurement that can be used to identify possible weight problems. It estimates body fat based on height and weight. Your health care provider can help determine your BMI and help you achieve or maintain a healthy weight.  Get regular exercise  Get regular exercise. This is one of the most important things you can do for your health. Most adults should:  Exercise for at least 150 minutes each week. The exercise should increase your heart rate and make you sweat (moderate-intensity exercise).  Do strengthening exercises at least twice a week. This is in addition to the moderate-intensity exercise.  Spend less time sitting. Even light physical activity can be beneficial.  Watch cholesterol and blood lipids  Have your blood tested for lipids and cholesterol at 76 years of age, then have this test every 5 years.  You may need to have your cholesterol levels checked more often if:  Your lipid or cholesterol levels are high.  You are older than 76 years of age.  You are at high risk for heart disease.  What should I know about cancer screening?  Many types of cancers can be detected early and may often be prevented. Depending on your health history and family history, you may need to have cancer screening at various ages. This may include screening for:  Colorectal cancer.  Prostate cancer.  Skin cancer.  Lung  cancer.  What should I know about heart disease, diabetes, and high blood pressure?  Blood pressure and heart disease  High blood pressure causes heart disease and increases the risk of stroke. This is more likely to develop in people who have high blood pressure readings or are overweight.  Talk with your health care provider about your target blood pressure readings.  Have your blood pressure checked:  Every 3-5 years if you are 24-52 years of age.  Every year if you are 3 years old or older.  If you are between the ages of 60 and 72 and are a current or former smoker, ask your health care provider if you should have a one-time screening for abdominal aortic aneurysm (AAA).  Diabetes  Have regular diabetes screenings. This checks your fasting blood sugar level. Have the screening done:  Once every three years after age 66 if you are at a normal weight and have a low risk for diabetes.  More often and at a younger age if you are overweight or have a high risk for diabetes.  What should I know about preventing infection?  Hepatitis B  If you have a higher risk for hepatitis B, you should be screened for this virus. Talk with your health care provider to find out if you are at risk for hepatitis B infection.  Hepatitis C  Blood testing is recommended for:  Everyone born from [birth year] through 1967.  Anyone  with known risk factors for hepatitis C.  Sexually transmitted infections (STIs)  You should be screened each year for STIs, including gonorrhea and chlamydia, if:  You are sexually active and are younger than 76 years of age.  You are older than 76 years of age and your health care provider tells you that you are at risk for this type of infection.  Your sexual activity has changed since you were last screened, and you are at increased risk for chlamydia or gonorrhea. Ask your health care provider if you are at risk.  Ask your health care provider about whether you are at high risk for HIV. Your health care provider  may recommend a prescription medicine to help prevent HIV infection. If you choose to take medicine to prevent HIV, you should first get tested for HIV. You should then be tested every 3 months for as long as you are taking the medicine.  Follow these instructions at home:  Alcohol use  Do not drink alcohol if your health care provider tells you not to drink.  If you drink alcohol:  Limit how much you have to 0-2 drinks a day.  Know how much alcohol is in your drink. In the [place] one drink equals one 12 oz bottle of beer (355 mL), one 5 oz glass of wine (148 mL), or one 1 oz glass of hard liquor (44 mL).  Lifestyle  Do not use any products that contain nicotine or tobacco. These products include cigarettes, chewing tobacco, and vaping devices, such as e-cigarettes. If you need help quitting, ask your health care provider.  Do not use street drugs.  Do not share needles.  Ask your health care provider for help if you need support or information about quitting drugs.  General instructions  Schedule regular health, dental, and eye exams.  Stay current with your vaccines.  Tell your health care provider if:  You often feel depressed.  You have ever been abused or do not feel safe at home.  Summary  Adopting a healthy lifestyle and getting preventive care are important in promoting health and wellness.  Follow your health care provider's instructions about healthy diet, exercising, and getting tested or screened for diseases.  Follow your health care provider's instructions on monitoring your cholesterol and blood pressure.  This information is not intended to replace advice given to you by your health care provider. Make sure you discuss any questions you have with your health care provider.  Document Revised: 07/28/2020 Document Reviewed: 07/28/2020  Elsevier Patient Education  2024 [employer].

## 2024-04-26 NOTE — Progress Notes (Signed)
 "     Office Note 04/26/2024  CC:  Chief Complaint  Patient presents with   Annual Exam    Pt is not fasting   Patient is a 74 y.o. male who is here for annual health maintenance exam and 26-month follow-up hypertension, prediabetes, hyperlipidemia, and hypothyroidism. A/P as of last visit: #1 prediabetes.  Doing great on metformin  500 mg twice daily. POC Hba1c today is 5.7%.   #2 hypercholesterolemia, doing well on atorvastatin  80 mg a day and Zetia  10 mg a day. Monitor lipids and hepatic panel today.   3.  Hypertension, history of.  He is on bisoprolol  5 mg a day. Looking back in the EMR it appears his blood pressure has been down similar to today's (90s over 60s) on multiple occasions.  The highest it has been is 126 systolic.  We decided to discontinue bisoprolol  today and see how his blood pressures are and see if he feels less fatigued.   #4 hypothyroidism, stable on 112 mcg daily. TSH 0.81 on 04/12/2023. Plan repeat TSH 6 months.  INTERIM HX: Feeling okay overall.  His excessive daytime sleepiness/chronic fatigue has still been an issue.  He went completely off his bisoprolol  for a while and did not think he felt any difference.  He restarted the bisoprolol  every day and then fairly recently decided to take it every other day and thinks he feels a little bit better.  He has never had evaluation for sleep apnea.  Diet and activity level have not been as good over the last several months.   Past Medical History:  Diagnosis Date   Aortic root enlargement    CTA chest/aorta 04/2017:  Mild enlargement of aortic root, measuring 3.9 x 3.8 x 3.6 cm.   Arthralgia of both knees 09/2015   Plain films with minimal DJD changes medial compartment, o/w nl.   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  03/2023 surveillance u/s-->Small amount of  interval growth on follow-up ultrasound . plan rpt 3 yrs   Fatigue due to treatment    beta blocker (?)   Hearing loss    Bilat sensorineural; [facility] ENT eval 07/2019; pt candidate for cochlear implants   History of adenomatous polyp of colon 05/19/2016   Recall 04/2021 (Dr. Leigh)   Hyperlipidemia    Hypothyroidism    Nephrolithiasis    passed one stone approx 2008; no prob since (saw urologist briefly). UC visit 06/2020 R flank pain, stone suspected.   Normal nuclear stress test 2012   Prediabetes 02/2016   HbA1c 6.1%.  2019 A1c 6.3%. 2022 A1c 6.3%. Jan 2025 a1c 6.4%.   Recurrent sinusitis    Sinus plain films 08/2014: bilat maxillary sinusitis (chronic)   S/P coronary artery stent placement 2005   RCA and LCX.  As of 02/2018 cardiology f/u, plan is to get ETT (pt doing fine, but was not having chest pain prior to last stent placement).   Tobacco dependence    current as of 01/2019    Past Surgical History:  Procedure Laterality Date   Aortic ultrasound  09/07/2018   Ectatic abd aorta (3.4 cm max diameter) but no aneurism.  Small amount of interval growth on follow-up ultrasound 03/2023---> repeat aortic ultrasound 3 years   APPENDECTOMY     CARDIAC CATHETERIZATION  04/16/2003  NORMAL. EF 60%. TOTALLY OCCLUDED LEFT CIRCUMFLEX WITH SEVERE DISEASE, AND ULCERATED PLAQUE IN THE PROXIMAL RIGHT CORONARY ARTERY AND MILD ATHERSCLEROSIS IN THE LEFT ANTERIOR DESCENDING   CARDIOVASCULAR STRESS TEST  2009;2012; 10/2014; 03/2018   2012 normal nuclear stress test.  2016 ETT no ischemia.  ETT normal 03/2018.   COLONOSCOPY W/ POLYPECTOMY  05/19/2016   Multiple polyps--tubular adenoma.  Recall 5 yrs (04/2021).   CORONARY STENT PLACEMENT  04/19/2003   STENT PLACEMENT IN THE LEFT CIRCUMFLEX CORONARY AND RIGHT CORONARY ARTERY. MILD RESIDUAL STENOSIS IN THE MID PORTION OF THE LEFT ANTERIOR DESCENDING   TRANSTHORACIC ECHOCARDIOGRAM  05/03/2017   EF 60-65%, normal valves, mildly enlarged aortic root.  f/u  CT angio showed no signif aortic root enlargement    Family History  Problem Relation Age of Onset   Hypertension Mother    Stroke Mother    Hypertension Father    Stroke Father    Prostate cancer Father    Heart attack Brother        STENT   Stroke Brother    Colon cancer Neg Hx     Social History   Socioeconomic History   Marital status: Married    Spouse name: Not on file   Number of children: 2   Years of education: Not on file   Highest education level: Not on file  Occupational History   Occupation: Airline Pilot Rep  Tobacco Use   Smoking status: Every Day    Current packs/day: 1.00    Types: Cigarettes   Smokeless tobacco: Never  Vaping Use   Vaping status: Never Used  Substance and Sexual Activity   Alcohol use: No   Drug use: No   Sexual activity: Yes  Other Topics Concern   Not on file  Social History Narrative   Married, 1 son in [place] and one in [place].   Lives in [place].   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   Tob: 40 pack-yr hx, current as of 08/2020.   Alcohol: social/rare.   Social Drivers of Health   Tobacco Use: High Risk (04/26/2024)   Patient History    Smoking Tobacco Use: Every Day    Smokeless Tobacco Use: Never    Passive Exposure: Not on file  Financial Resource Strain: Low Risk (11/30/2023)   Overall Financial Resource Strain (CARDIA)    Difficulty of Paying Living Expenses: Not hard at all  Food Insecurity: No Food Insecurity (11/30/2023)   Epic    Worried About Programme Researcher, Broadcasting/film/video in the Last Year: Never true    Ran Out of Food in the Last Year: Never true  Transportation Needs: No Transportation Needs (11/30/2023)   Epic    Lack of Transportation (Medical): No    Lack of Transportation (Non-Medical): No  Physical Activity: Insufficiently Active (11/30/2023)   Exercise Vital Sign    Days of Exercise per Week: 4 days    Minutes of Exercise per Session: 30 min  Stress:  No Stress Concern Present (11/30/2023)   [employer] of Occupational Health - Occupational Stress Questionnaire    Feeling of Stress: Not at all  Social Connections: Moderately Integrated (11/30/2023)   Social Connection and Isolation Panel    Frequency of Communication with Friends and Family: More than three times a week    Frequency of Social Gatherings with Friends and Family: More than three times a week    Attends Religious Services: 1 to  4 times per year    Active Member of Clubs or Organizations: No    Attends Banker Meetings: Never    Marital Status: Married  Catering Manager Violence: Not At Risk (11/30/2023)   Epic    Fear of Current or Ex-Partner: No    Emotionally Abused: No    Physically Abused: No    Sexually Abused: No  Depression (PHQ2-9): Low Risk (04/26/2024)   Depression (PHQ2-9)    PHQ-2 Score: 0  Alcohol Screen: Low Risk (11/30/2023)   Alcohol Screen    Last Alcohol Screening Score (AUDIT): 0  Housing: Unknown (11/30/2023)   Epic    Unable to Pay for Housing in the Last Year: No    Number of Times Moved in the Last Year: Not on file    Homeless in the Last Year: No  Utilities: Not At Risk (11/30/2023)   Epic    Threatened with loss of utilities: No  Health Literacy: Adequate Health Literacy (11/30/2023)   B1300 Health Literacy    Frequency of need for help with medical instructions: Never    Outpatient Medications Prior to Visit  Medication Sig Dispense Refill   aspirin 81 MG tablet Take 81 mg by mouth daily.     atorvastatin  (LIPITOR) 80 MG tablet TAKE 1 TABLET BY MOUTH EVERY DAY 90 tablet 3   bisoprolol  (ZEBETA ) 5 MG tablet Take 1 tablet (5 mg total) by mouth daily. 90 tablet 3   Cyanocobalamin (VITAMIN B-12 PO) Take 1 tablet by mouth daily.     fluticasone  (FLONASE ) 50 MCG/ACT nasal spray Place 2 sprays into both nostrils daily. 48 g 3   levothyroxine  (SYNTHROID ) 112 MCG tablet Take 1 tablet (112 mcg total) by mouth daily. 90 tablet 3    montelukast  (SINGULAIR ) 10 MG tablet TAKE 1 TABLET BY MOUTH EVERYDAY AT BEDTIME 90 tablet 3   ezetimibe  (ZETIA ) 10 MG tablet TAKE 1 TABLET BY MOUTH EVERY DAY 90 tablet 1   metFORMIN  (GLUCOPHAGE ) 500 MG tablet 1 tab po bid 60 tablet 0   No facility-administered medications prior to visit.    Allergies[1]  Review of Systems  Constitutional:  Positive for fatigue. Negative for appetite change, chills and fever.  HENT:  Negative for congestion, dental problem, ear pain and sore throat.   Eyes:  Negative for discharge, redness and visual disturbance.  Respiratory:  Negative for cough, chest tightness, shortness of breath and wheezing.   Cardiovascular:  Negative for chest pain, palpitations and leg swelling.  Gastrointestinal:  Negative for abdominal pain, blood in stool, diarrhea, nausea and vomiting.  Genitourinary:  Negative for difficulty urinating, dysuria, flank pain, frequency, hematuria and urgency.  Musculoskeletal:  Negative for arthralgias, back pain, joint swelling, myalgias and neck stiffness.  Skin:  Negative for pallor and rash.  Neurological:  Negative for dizziness, speech difficulty, weakness and headaches.  Hematological:  Negative for adenopathy. Does not bruise/bleed easily.  Psychiatric/Behavioral:  Negative for confusion and sleep disturbance. The patient is not nervous/anxious.    PE;    04/26/2024    9:33 AM 11/30/2023    8:18 AM 10/28/2023   10:55 AM  Vitals with BMI  Height 5' 9.75 5' 11 5' 11  Weight 189 lbs 176 lbs 176 lbs  BMI 27.3 24.56 24.56  Systolic 110  114  Diastolic 60  66  Pulse 75  82   Gen: Alert, well appearing.  Patient is oriented to person, place, time, and situation. AFFECT: pleasant, lucid thought and speech.  ENT: Ears: EACs clear, normal epithelium.  TMs with good light reflex and landmarks bilaterally.  Eyes: no injection, icteris, swelling, or exudate.  EOMI, PERRLA. Nose: no drainage or turbinate edema/swelling.  No injection or focal  lesion.  Mouth: lips without lesion/swelling.  Oral mucosa pink and moist.  Dentition intact and without obvious caries or gingival swelling.  Oropharynx without erythema, exudate, or swelling.  Neck: supple/nontender.  No LAD, mass, or TM.  Carotid pulses 2+ bilaterally, without bruits. CV: RRR, no m/r/g.   LUNGS: CTA bilat, nonlabored resps, good aeration in all lung fields. ABD: soft, NT, ND, BS normal.  No hepatospenomegaly or mass.  No bruits. EXT: no clubbing, cyanosis, or edema.  Musculoskeletal: no joint swelling, erythema, warmth, or tenderness.  ROM of all joints intact. Skin - no sores or suspicious lesions or rashes or color changes  Pertinent labs:  Lab Results  Component Value Date   TSH 0.81 04/12/2023   Lab Results  Component Value Date   WBC 8.4 10/28/2021   HGB 16.0 10/28/2021   HCT 47.4 10/28/2021   MCV 86.9 10/28/2021   PLT 167.0 10/28/2021   Lab Results  Component Value Date   CREATININE 0.91 10/19/2023   BUN 14 10/19/2023   NA 140 10/19/2023   K 4.3 10/19/2023   CL 102 10/19/2023   CO2 30 10/19/2023   Lab Results  Component Value Date   ALT 22 10/19/2023   AST 19 10/19/2023   ALKPHOS 109 10/19/2023   BILITOT 1.0 10/19/2023   Lab Results  Component Value Date   CHOL 107 10/19/2023   Lab Results  Component Value Date   HDL 39.10 10/19/2023   Lab Results  Component Value Date   LDLCALC 48 10/19/2023   Lab Results  Component Value Date   TRIG 99.0 10/19/2023   Lab Results  Component Value Date   CHOLHDL 3 10/19/2023   Lab Results  Component Value Date   PSA 1.48 02/01/2019   PSA 1.34 03/18/2016   Lab Results  Component Value Date   HGBA1C 5.7 (A) 10/19/2023   HGBA1C 5.7 10/19/2023   HGBA1C 5.7 10/19/2023   HGBA1C 5.7 10/19/2023   ASSESSMENT AND PLAN:   #1 health maintenance exam: Reviewed age and gender appropriate health maintenance issues (prudent diet, regular exercise, health risks of tobacco and excessive alcohol, use of  seatbelts, fire alarms in home, use of sunscreen).  Also reviewed age and gender appropriate health screening as well as vaccine recommendations. Vaccines: All UTD Labs: bmet, tsh,lipids, a1c Prostate ca screening: Per shared decision making process back in 2020 he has decided that he wants no further prostate cancer screening. Colon ca screening: recall as of 04/2021--> I gave patient the [facility] contact information so he can call and arrange this. Lung cancer screening: He has declined this in the past. He is no longer within the age criteria for this screening.  #2 prediabetes.  Doing great on metformin  500 mg twice daily. Hemoglobin A1c today.   #2 hypercholesterolemia, doing well on atorvastatin  80 mg a day and Zetia  10 mg a day. LDL was 48 approximately 6 months ago.  He is not fasting today.  Plan recheck lipids in 6 months.   3.  Hypertension, history of.  He is on bisoprolol  5 mg every other day.   #4 hypothyroidism, stable on 112 mcg daily. TSH 0.81 on 04/12/2023. Check TSH today.  #5 excessive daytime sleepiness/fatigue. We have experimented with his beta-blocker to see if  this was possibly the cause but it does not look like it is. Refer to neurology for possible obstructive sleep apnea evaluation.  An After Visit Summary was printed and given to the patient.  FOLLOW UP:  Return in about 6 months (around 10/24/2024) for routine chronic illness f/u.  Signed:  Gerlene Hockey, MD           04/26/2024     [1]  Allergies Allergen Reactions   Plavix [Clopidogrel Bisulfate] Rash   "

## 2024-10-24 ENCOUNTER — Ambulatory Visit: Admitting: Family Medicine

## 2024-12-05 ENCOUNTER — Encounter
